# Patient Record
Sex: Female | Born: 1940 | Race: White | Hispanic: No | Marital: Married | Smoking: Never smoker
Health system: Southern US, Community
[De-identification: ages and names within clinical notes are randomized; demographics above are authoritative.]

## PROBLEM LIST (undated history)

## (undated) DIAGNOSIS — M51379 Other intervertebral disc degeneration, lumbosacral region without mention of lumbar back pain or lower extremity pain: Secondary | ICD-10-CM

## (undated) DIAGNOSIS — E039 Hypothyroidism, unspecified: Secondary | ICD-10-CM

## (undated) DIAGNOSIS — I8390 Asymptomatic varicose veins of unspecified lower extremity: Secondary | ICD-10-CM

## (undated) DIAGNOSIS — I10 Essential (primary) hypertension: Secondary | ICD-10-CM

## (undated) DIAGNOSIS — F419 Anxiety disorder, unspecified: Secondary | ICD-10-CM

## (undated) DIAGNOSIS — I129 Hypertensive chronic kidney disease with stage 1 through stage 4 chronic kidney disease, or unspecified chronic kidney disease: Secondary | ICD-10-CM

## (undated) DIAGNOSIS — E7800 Pure hypercholesterolemia, unspecified: Secondary | ICD-10-CM

## (undated) DIAGNOSIS — N182 Chronic kidney disease, stage 2 (mild): Secondary | ICD-10-CM

## (undated) DIAGNOSIS — M47816 Spondylosis without myelopathy or radiculopathy, lumbar region: Secondary | ICD-10-CM

## (undated) DIAGNOSIS — M8580 Other specified disorders of bone density and structure, unspecified site: Secondary | ICD-10-CM

## (undated) DIAGNOSIS — H9190 Unspecified hearing loss, unspecified ear: Secondary | ICD-10-CM

## (undated) DIAGNOSIS — I700 Atherosclerosis of aorta: Secondary | ICD-10-CM

## (undated) DIAGNOSIS — Z780 Asymptomatic menopausal state: Secondary | ICD-10-CM

## (undated) DIAGNOSIS — K219 Gastro-esophageal reflux disease without esophagitis: Secondary | ICD-10-CM

## (undated) DIAGNOSIS — F329 Major depressive disorder, single episode, unspecified: Secondary | ICD-10-CM

## (undated) DIAGNOSIS — M810 Age-related osteoporosis without current pathological fracture: Secondary | ICD-10-CM

## (undated) DIAGNOSIS — E871 Hypo-osmolality and hyponatremia: Secondary | ICD-10-CM

## (undated) DIAGNOSIS — K589 Irritable bowel syndrome without diarrhea: Secondary | ICD-10-CM

## (undated) DIAGNOSIS — E038 Other specified hypothyroidism: Secondary | ICD-10-CM

## (undated) DIAGNOSIS — M5137 Other intervertebral disc degeneration, lumbosacral region: Secondary | ICD-10-CM

## (undated) DIAGNOSIS — M87051 Idiopathic aseptic necrosis of right femur: Secondary | ICD-10-CM

## (undated) DIAGNOSIS — E559 Vitamin D deficiency, unspecified: Secondary | ICD-10-CM

## (undated) DIAGNOSIS — R2689 Other abnormalities of gait and mobility: Secondary | ICD-10-CM

## (undated) DIAGNOSIS — E876 Hypokalemia: Secondary | ICD-10-CM

## (undated) DIAGNOSIS — IMO0001 Reserved for inherently not codable concepts without codable children: Secondary | ICD-10-CM

## (undated) DIAGNOSIS — F32A Depression, unspecified: Secondary | ICD-10-CM

## (undated) DIAGNOSIS — F339 Major depressive disorder, recurrent, unspecified: Secondary | ICD-10-CM

## (undated) HISTORY — DX: Other specified disorders of bone density and structure, unspecified site: M85.80

## (undated) HISTORY — DX: Unspecified hearing loss, unspecified ear: H91.90

## (undated) HISTORY — DX: Major depressive disorder, recurrent, unspecified: F33.9

## (undated) HISTORY — DX: Other intervertebral disc degeneration, lumbosacral region without mention of lumbar back pain or lower extremity pain: M51.379

## (undated) HISTORY — DX: Hypo-osmolality and hyponatremia: E87.1

## (undated) HISTORY — PX: CHOLECYSTECTOMY: SHX55

## (undated) HISTORY — DX: Irritable bowel syndrome, unspecified: K58.9

## (undated) HISTORY — DX: Other specified hypothyroidism: E03.8

## (undated) HISTORY — DX: Asymptomatic menopausal state: Z78.0

## (undated) HISTORY — DX: Other intervertebral disc degeneration, lumbosacral region: M51.37

## (undated) HISTORY — DX: Idiopathic aseptic necrosis of right femur: M87.051

## (undated) HISTORY — DX: Gastro-esophageal reflux disease without esophagitis: K21.9

## (undated) HISTORY — DX: Atherosclerosis of aorta: I70.0

## (undated) HISTORY — DX: Vitamin D deficiency, unspecified: E55.9

## (undated) HISTORY — PX: BREAST EXCISIONAL BIOPSY: SUR124

## (undated) HISTORY — PX: CATARACT EXTRACTION: SUR2

## (undated) HISTORY — PX: BREAST SURGERY: SHX581

## (undated) HISTORY — DX: Essential (primary) hypertension: I10

## (undated) HISTORY — DX: Hypertensive chronic kidney disease with stage 1 through stage 4 chronic kidney disease, or unspecified chronic kidney disease: I12.9

## (undated) HISTORY — DX: Age-related osteoporosis without current pathological fracture: M81.0

## (undated) HISTORY — DX: Hypokalemia: E87.6

## (undated) HISTORY — DX: Depression, unspecified: F32.A

## (undated) HISTORY — DX: Pure hypercholesterolemia, unspecified: E78.00

## (undated) HISTORY — DX: Chronic kidney disease, stage 2 (mild): N18.2

## (undated) HISTORY — PX: FOOT SURGERY: SHX648

## (undated) HISTORY — DX: Spondylosis without myelopathy or radiculopathy, lumbar region: M47.816

## (undated) HISTORY — DX: Anxiety disorder, unspecified: F41.9

## (undated) HISTORY — DX: Other abnormalities of gait and mobility: R26.89

## (undated) HISTORY — PX: VAGINAL HYSTERECTOMY: SUR661

## (undated) HISTORY — DX: Asymptomatic varicose veins of unspecified lower extremity: I83.90

## (undated) HISTORY — DX: Reserved for inherently not codable concepts without codable children: IMO0001

## (undated) HISTORY — DX: Hypothyroidism, unspecified: E03.9

---
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
## 2004-02-08 ENCOUNTER — Other Ambulatory Visit: Admission: RE | Admit: 2004-02-08 | Discharge: 2004-02-08 | Payer: Self-pay | Admitting: Family Medicine

## 2004-09-09 ENCOUNTER — Other Ambulatory Visit: Admission: RE | Admit: 2004-09-09 | Discharge: 2004-09-09 | Payer: Self-pay | Admitting: Gynecology

## 2005-08-22 ENCOUNTER — Other Ambulatory Visit: Admission: RE | Admit: 2005-08-22 | Discharge: 2005-08-22 | Payer: Self-pay | Admitting: Family Medicine

## 2008-01-25 ENCOUNTER — Other Ambulatory Visit: Admission: RE | Admit: 2008-01-25 | Discharge: 2008-01-25 | Payer: Self-pay | Admitting: Family Medicine

## 2008-06-26 ENCOUNTER — Inpatient Hospital Stay (HOSPITAL_COMMUNITY): Admission: AD | Admit: 2008-06-26 | Discharge: 2008-06-27 | Payer: Self-pay | Admitting: Internal Medicine

## 2010-04-29 ENCOUNTER — Other Ambulatory Visit: Admission: RE | Admit: 2010-04-29 | Discharge: 2010-04-29 | Payer: Self-pay | Admitting: Family Medicine

## 2011-04-29 NOTE — Discharge Summary (Signed)
NAMECANDISE, CRABTREE         ACCOUNT NO.:  [id number]   MEDICAL RECORD NO.:  [id number]          PATIENT TYPE:  INP   LOCATION:  3737                         FACILITY:  [facility]   PHYSICIAN:  Ramiro Harvest, MD    DATE OF BIRTH:  January 06, 1941   DATE OF ADMISSION:  06/26/2008  DATE OF DISCHARGE:  06/27/2008                               DISCHARGE SUMMARY   PRIMARY CARE PHYSICIAN:  Gretta Arab. Valentina Lucks, MD of [facility].   GASTROENTEROLOGIST:  Llana Aliment. Randa Evens, MD of [facility].   DISCHARGE DIAGNOSES:  1. Acute gastroenteritis.  2. Syncope secondary to volume depletion.  3. Hypokalemia.  4. Hypertension.  5. Hyponatremia.  6. Anxiety.  7. Leukocytosis secondary to acute gastroenteritis.  8. Irritable bowel syndrome.  9. Constipation.  10.Gastroesophageal reflux disease.  11.Hyperlipidemia.  12.Status post cholecystectomy.  13.Status post hysterectomy.  14.Benign bilateral breast masses.   DISCHARGE MEDICATIONS:  1. Crestor 5 mg p.o. daily.  2. Lexapro 10 mg p.o. daily.  3. Aspirin 81 mg p.o. daily.  4. Nexium 40 mg p.o. daily.  5. Multivitamin daily.  6. Lisinopril/hydrochlorothiazide 10/12.5 mg daily.  The patient is to      start this in 3-4 days after her diarrhea has subsided.  7. Fish oil pill 1000 mg 3 times a week.  8. Flaxseed daily.  9. Valium 2.5 mg p.o. p.r.n.   DISPOSITION AND FOLLOWUP:  The patient will be discharged home.  The  patient is to follow up with PCP in 1 week.  On followup, the patient is  to get a basic metabolic profile to check her potassium and electrolytes  and also reassess the patient's gastroenteritis.  The patient's blood  pressure needs to be reassessed as well.  The patient's blood pressure  medications adjusted accordingly.  The patient's stool studies need to  be followed up per PCP.   CONSULTATIONS DONE:  None.   PROCEDURES DONE.:  A chest x-ray was done on June 26, 2008, that showed  no acute cardiopulmonary disease,  cholecystectomy, atherosclerosis, and  bilateral pleural apical scarring, question smoker.  A CT of the abdomen  and pelvis were done on June 27, 2008, which showed a thickened small  bowel loops in the lower central abdomen may be due to enteritis,  ascites and small bilateral pleural effusions as well as ascites in the  pelvis.   BRIEF ADMISSION HISTORY AND PHYSICAL:  Ms. Tiffinie Caillier is a 70-  year-old white female with history of hypertension, hyperlipidemia, IBS,  and anxiety who presented to the [facility] with a  several episodes of diarrhea, nausea, emesis, and diffuse abdominal pain  as well as 3 episodes of syncope.  The patient stated that she ate a  cheese steak pizza the night before admission around 5 p.m. and then at  home started to have this diffuse abdominal pain began at 8 p.m.,  everyone else who ate the same meal was not sick as well.  The patient  took 2 Tums 2 times and half a tablet of Valium with no relief.  The  patient woke back up at around 1 a.m.  with some abdominal pain, which  was 10/10.  She describes the pain as hurting.  The patient had some  nonbloody diarrhea x2 episodes at home with some emesis x2 with some  mild relief of abdominal pain.  The patient then went to bed and was  awoken back up around 2:45 a.m. with nausea, sat up in the chair for an  hour and stated that she blacked out twice hitting the head on the foot  of the bed.  Per husband, the patient passed out approximately 3 times  each one lasting approximately less than a minute.  The patient also had  some clamminess and diaphoresis with some subjective fevers.  The  patient denied any hematemesis.  No melena.  No hematochezia.  No chest  pain.  No shortness of breath.  No facial droop.  No slurred speech.  No  focal neurological symptoms.  No other associated symptoms.  The patient  then presented to the ED at the outside hospital were head CT done was  read as  negative.  The patient was found to have a sodium of 129, and  potassium of 3.4.  White count elevated at 17.7.  The patient stated  that she was recently started lisinopril/hydrochlorothiazide and only  took 1 dose prior to admission.  The patient was then transferred to  [facility] for further evaluation and management.  The patient  denied any use of any well water at home.  No ingestion of any exotic  foods.  No recent travel as well.  The patient stated that her abdominal  pain was currently improvement and had no more episodes of emesis,  nausea, or diarrhea.   PHYSICAL EXAMINATION:  VITAL SIGNS:  On admission, temperature 98.7,  pulse of 79, blood pressure 129/58, respiratory rate 20, and satting 98%  on room air.  GENERAL:  The patient in no apparent distress.  HEENT:  Normocephalic and atraumatic.  Pupils are equal, round, and  reactive to light.  Extraocular movements intact.  Oropharynx is clear.  No lesions.  No exudate.  NECK:  Supple.  No lymphadenopathy.  RESPIRATORY:  Lungs are clear to auscultation bilaterally.  No wheezes.  No rhonchi.  CARDIOVASCULAR:  Regular rate and rhythm.  No murmurs, rubs, or gallops.  ABDOMEN:  Soft and nondistended.  Diffuse tenderness to palpation.  Positive bowel sounds.  EXTREMITIES:  No clubbing, cyanosis, or edema.  NEUROLOGIC:  The patient was alert and oriented x3.  Cranial nerves II  through XII are grossly intact.  No focal deficits.  Gait was not  tested.   ADMISSION LABS ON OUTSIDE HOSPITAL:  CBC, white count 17.7, hemoglobin  15.7, platelets 278, hematocrit 43.8, ANC of 16.9, and amylase 82.  Sodium 129, potassium 3.4, chloride 92, bicarb 28, BUN 15, creatinine  0.86, and glucose 172.  Alk phosphatase 58, total bilirubin 1.4, AST of  23, ALT of 18, protein 6.4, albumin 3.9, calcium of 9.0, CPK 129, CK-MB  4.8, myoglobin 108, and troponin I less than 0.03.  CT of the head was  negative for any acute intracranial  pathology.   HOSPITAL COURSE:  Acute gastroenteritis.  The patient was admitted for  acute gastroenteritis.  Stool studies were obtained.  Blood cultures x2  were also obtained.  A CBC and a BMET were also obtained, which came  back within normal limits.  Cardiac enzymes were cycled, which came back  negative.  A TSH was also obtained, which was within normal  limits.  Amylase and lipase were also obtained which were within normal limits.  The patient was placed on IV fluids and monitored throughout the  hospitalization.  The patient remained stable.  The patient's symptoms  improved.  The patient did not have any more episodes of emesis.  The  patient's diarrhea improved.  There was a decrease number of stools,  decreased volume, and stools was a little more formed.  The patient was  initially placed on clear liquid diet, which was then advanced to full  regular diet, which the patient tolerated well.  CT of the abdomen was  also obtained with results as stated above, which were consistent with  acute gastroenteritis.  Ascites seen on CT, will need to be followed up  as an outpatient.  The patient remained stable and improved condition.  The patient was very adamant on going home and the patient was thus  discharged on June 27, 2008 in stable and improved condition.  The  patient was advised to stay well hydrated and to follow up with PCP in a  week.  On followup, the patient's stool studies will need to be followed  up as well as the patient may need followup on the ascites per abdominal  CT in probably the next few weeks or in a month.  The patient remained  stable and discharged in stable and improved condition. On the day of  discharge vital signs, temperature 98.5, pulse of 75, blood pressure  126/69, respiratory rate 16, and satting 95% on room air.  Sodium 135,  potassium 3.3, chloride 104, bicarb 28, BUN 5, creatinine 0.73, glucose  of 123, calcium of 8.1, and bilirubin 1.2.  Alk  phosphatase 39, AST 18,  ALT 12, albumin 3.0, and protein of 4.9.  CBC, white count 6.9,  hemoglobin 11.9, platelets of 216, hematocrit 34.4, ANC of 4.3, lipase  30, and amylase 63.  Cardiac enzymes were negative.  The patient will be  discharged in stable and improved condition.   It was a pleasure taking care of Ms. Leonie Green.      Ramiro Harvest, MD  Electronically Signed     DT/MEDQ  D:  06/27/2008  T:  06/28/2008  Job:  [id number]   cc:   Gretta Arab. Valentina Lucks, M.D.  James L. Malon Kindle., M.D.

## 2011-04-29 NOTE — H&P (Signed)
NAMESALAH, BURLISON         ACCOUNT NO.:  [id number]   MEDICAL RECORD NO.:  [id number]          PATIENT TYPE:  INP   LOCATION:  3737                         FACILITY:  [facility]   PHYSICIAN:  Ramiro Harvest, MD    DATE OF BIRTH:  [birth year]   DATE OF ADMISSION:  06/26/2008  DATE OF DISCHARGE:                              HISTORY & PHYSICAL   PRIMARY CARE PHYSICIAN:  Gretta Arab. Valentina Lucks, MD   GASTROENTEROLOGIST:  Llana Aliment. Edwards, MD   HISTORY OF PRESENT ILLNESS:  Teneisha Gignac is a 70 year old white  female with history of hypertension, hyperlipidemia, IBS, anxiety who  presented to [facility] with a several hour history  of several episodes of diarrhea, nausea, emesis, and diffuse abdominal  pain as well as 3 episodes of syncope.  The patient stated that she ate  cheese steak pizza last night around 5 p.m. and then at home started to  have this diffuse abdominal pain beginning at 8 p.m.  Everyone else who  ate the same meal was not sick as well.  The patient took 2 Tums and  Halfprin with no relief.  The patient woke back up at 1 a.m. with some  abdominal pain 10/10.  She describes the pain as a hurting.  The  patient has some nonbloody diarrhea x2 episodes at home with some emesis  x2 with some mild relief of her abdominal pain.  The patient then went  to bed and was awoken back up at 2:45 a.m. with nausea and sat up in the  chair for an hour and stated that she blacked out twice hitting her head  on the foot of her bed.  Per husband, the patient passed approximately 3  times each one lasting approximately about a minute.  The patient also  with some clamminess and diaphoresis, some subjective fevers.  The  patient denies any hematemesis, no melena, no hematochezia, no chest  pain, no shortness of breath, no facial droop, no slurred speech, no  focal neurological symptoms, and no other associated symptoms.  The  patient then presented to the ED at the  outside hospital where head CT  was done which was read as negative.  The patient was found to have a  sodium of 129, potassium of 3.4, and white count elevated at 17.7.  The  patient states recently was started on lisinopril/hydrochlorothiazide,  took one dose 1 day prior to admission.  The patient was then  transferred to the [facility] for further evaluation and  management.  The patient denies any use of any well water at home and no  ingestion of any exotic food and no recent travel as well.  The patient  states that her abdominal pain is currently improved and has had no more  episodes of emesis, nausea, or diarrhea.   ALLERGIES:  PENICILLIN causes a rash.  SULFA causes a rash and CODEINE.   PAST MEDICAL HISTORY:  1. Hypertension.  2. Hyperlipidemia.  3. Status post cholecystectomy.  4. Anxiety.  5. IBS.  6. Constipation.  7. Status post hysterectomy.  8. Benign bilateral breast masses.  9.  Gastroesophageal reflux disease.   HOME MEDICATIONS:  1. Crestor 5 mg daily.  2. Lexapro 10 mg daily.  3. Aspirin 81 mg daily.  4. Nexium 40 mg daily.  5. Multivitamin daily.  6. Lisinopril/hydrochlorothiazide, dose unknown.  7. Fish oil pill 1000 mg 3 times a week.  8. Flaxseed daily.  9. MiraLax 17 grams daily.   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  No tobacco use, no alcohol use, and no IV drug  use.   FAMILY HISTORY:  Mother deceased at age 25 from a CVA and bad urinary  tract infection.  Father deceased at age 60 from a CVA.  The patient has  2 sisters whose health is unknown, and has 1 brother who had an acute MI  status post CABG in 2007.   REVIEW OF SYSTEMS:  As stated per HPI.  Also positive for fatigue,  nausea, vomiting, diarrhea, and weakness.   PHYSICAL EXAMINATION:  VITAL SIGNS:  Temperature 98.7, pulse of 79,  blood pressure  129/58, respiratory rate 20, saturating 98% on room air.  GENERAL:  The patient is in no apparent distress.  HEENT:  Normocephalic, atraumatic.  Pupils equal, round, and reactive to  light.  Extraocular movements are intact.  Oropharynx is clear.  No  lesions, no exudate.  NECK:  Supple.  No lymphadenopathy.  RESPIRATORY:  Lungs are clear to auscultation bilaterally.  No wheezes,  no rhonchi.  CARDIOVASCULAR:  Regular rate and rhythm.  No murmurs, rubs, or gallops.  ABDOMEN:  Soft, nondistended, diffuse tenderness to palpation.  Positive  bowel sounds.  EXTREMITIES:  No clubbing, cyanosis, or edema.  NEUROLOGICAL:  The patient is alert and oriented x3.  Cranial nerves II-  XII are grossly intact.  No focal deficits.  Gait not tested.   LABORATORY DATA:  From the outside hospital:  CBC, white count 17.7,  hemoglobin 15.7, platelets 278, hematocrit 43.8, ANC of 16.9, amylase  82, sodium 129, potassium 3.4, chloride 92, bicarb 28, BUN 15,  creatinine 0.86, glucose 172, alk phosphatase 58, total bilirubin 1.4,  AST 23, ALT 18, protein 6.4, albumin 3.9, calcium of 9.0, CPK 129, CK-MB  4.8, myoglobin 108, troponin I less than 0.03.  CT of the head was  negative for any acute intracranial pathology.   ASSESSMENT AND PLAN:  Ms. Kessler Kopinski is a 70 year old female  who presents to [facility] with a several hour history  of nausea, vomiting, and abdominal pain with several episodes of  diarrhea and emesis.  1. Nausea, vomiting, diarrhea, abdominal pain, likely secondary to a      viral versus bacterial gastroenteritis versus acute abdominal      process.  We will check a urinalysis with cultures and      sensitivities, check a magnesium level, check blood cultures x2,      check the lipase, check CT of the abdomen and pelvis.  We will      check stool studies.  We will cycle cardiac enzymes.  We will place      on IV fluids and follow.  2. Syncope, likely secondary to  hypovolemia secondary to nausea,      vomiting, diarrhea, abdominal pain versus neurological which is      unlikely with a normal head CT and no neurological deficits versus      cardiac.  We will check orthostasis.  We will cycle cardiac enzymes      q.8 h. x3.  Check an EKG.  We will place her on IV fluids, hold      blood pressure medications for now, and follow.  3. Hypertension.  Hold blood pressure medications.  4. Hyponatremia, likely secondary to hypovolemia in the setting of      diuretic use.  We will hold diuretics for now.  We will check      orthostasis.  Check a fractional excretion of sodium, check a TSH,      check a random cortisol level, check a PA and lateral chest x-ray,      place on IV fluids, and follow sodium levels.  5. Hypokalemia.  Check a magnesium level and replete.  6. Anxiety, Lexapro.  7. Gastroesophageal reflux disease, Protonix.  8. Hyperlipidemia, Crestor.  9. Inflammatory bowel syndrome.  10.Constipation.  11.Leukocytosis, likely secondary to nausea, vomiting, diarrhea, and      abdominal pain.  We will check a UA.  We will check a PA and      lateral chest x-ray.  No IV antibiotics at this current time.      Prophylaxis Protonix for gastrointestinal prophylaxis.  Sequential      compression devices for deep vein thrombosis prophylaxis.   It has been a pleasure taking care of Ms. Leonie Green.      Ramiro Harvest, MD  Electronically Signed     DT/MEDQ  D:  06/26/2008  T:  06/27/2008  Job:  [id number]   cc:   Gretta Arab. Valentina Lucks, M.D.  James L. Malon Kindle., M.D.

## 2011-09-11 LAB — CLOSTRIDIUM DIFFICILE EIA: C difficile Toxins A+B, EIA: NEGATIVE

## 2011-09-11 LAB — DIFFERENTIAL
Basophils Absolute: 0
Basophils Relative: 1
Eosinophils Absolute: 0.1
Lymphs Abs: 1.6
Neutrophils Relative %: 63

## 2011-09-11 LAB — TSH: TSH: 1.781

## 2011-09-11 LAB — URINALYSIS, ROUTINE W REFLEX MICROSCOPIC
Ketones, ur: NEGATIVE
Nitrite: NEGATIVE
Protein, ur: NEGATIVE
Urobilinogen, UA: 0.2

## 2011-09-11 LAB — BASIC METABOLIC PANEL
BUN: 8
Chloride: 101
Glucose, Bld: 100 — ABNORMAL HIGH
Potassium: 3.6

## 2011-09-11 LAB — COMPREHENSIVE METABOLIC PANEL
ALT: 12
CO2: 28
Calcium: 8.1 — ABNORMAL LOW
GFR calc non Af Amer: >60
Glucose, Bld: 123 — ABNORMAL HIGH
Sodium: 135

## 2011-09-11 LAB — GIARDIA/CRYPTOSPORIDIUM SCREEN(EIA)
Cryptosporidium Screen (EIA): NEGATIVE
Giardia Screen - EIA: NEGATIVE

## 2011-09-11 LAB — CK TOTAL AND CKMB (NOT AT ARMC)
Relative Index: 2.7 — ABNORMAL HIGH
Total CK: 112

## 2011-09-11 LAB — URINE CULTURE: Colony Count: 10000

## 2011-09-11 LAB — TROPONIN I
Troponin I: 0.01
Troponin I: <0.01

## 2011-09-11 LAB — CBC
Hemoglobin: 11.9 — ABNORMAL LOW
MCHC: 34.5
MCV: 98.1
RBC: 3.5 — ABNORMAL LOW

## 2011-09-11 LAB — LIPASE, BLOOD: Lipase: 30

## 2011-09-11 LAB — PROTIME-INR: Prothrombin Time: 13.9

## 2011-09-11 LAB — CORTISOL: Cortisol, Plasma: 5.2

## 2012-04-05 ENCOUNTER — Encounter: Payer: Self-pay | Admitting: Gynecology

## 2012-04-05 ENCOUNTER — Ambulatory Visit (INDEPENDENT_AMBULATORY_CARE_PROVIDER_SITE_OTHER): Payer: Medicare Other | Admitting: Gynecology

## 2012-04-05 VITALS — BP 124/76 | Ht 63.0 in | Wt 163.0 lb

## 2012-04-05 DIAGNOSIS — N3941 Urge incontinence: Secondary | ICD-10-CM

## 2012-04-05 DIAGNOSIS — IMO0002 Reserved for concepts with insufficient information to code with codable children: Secondary | ICD-10-CM

## 2012-04-05 DIAGNOSIS — I10 Essential (primary) hypertension: Secondary | ICD-10-CM | POA: Insufficient documentation

## 2012-04-05 DIAGNOSIS — E7800 Pure hypercholesterolemia, unspecified: Secondary | ICD-10-CM | POA: Insufficient documentation

## 2012-04-05 DIAGNOSIS — F419 Anxiety disorder, unspecified: Secondary | ICD-10-CM | POA: Insufficient documentation

## 2012-04-05 DIAGNOSIS — N952 Postmenopausal atrophic vaginitis: Secondary | ICD-10-CM

## 2012-04-05 DIAGNOSIS — N8111 Cystocele, midline: Secondary | ICD-10-CM

## 2012-04-05 DIAGNOSIS — K589 Irritable bowel syndrome without diarrhea: Secondary | ICD-10-CM | POA: Insufficient documentation

## 2012-04-05 MED ORDER — ESTRADIOL 10 MCG VA TABS: 1.0000 | ORAL_TABLET | VAGINAL | Status: DC

## 2012-04-05 NOTE — Progress Notes (Signed)
Brittany Douglas 07/12/1941 [id number]        71 y.o.  for follow up. Has not been the office for a number of years. Is having issues with dyspareunia/vaginal tearing with intercourse.  Had a trial of Vagifem a number of years ago but stopped it. She does use some estrogen cream samples from elsewhere that she applies just around the opening intermittently which seems to help.  Past medical history,surgical history, medications, allergies, family history and social history were all reviewed and documented in the EPIC chart. ROS:  Was performed and pertinent positives and negatives are included in the history.  Exam: Sherrilyn Rist chaperone present Filed Vitals:   04/05/12 1009  BP: 124/76   General appearance  Normal Skin grossly normal Head/Neck normal with no cervical or supraclavicular adenopathy thyroid normal Lungs  clear Cardiac RR, without RMG Abdominal  soft, nontender, without masses, organomegaly or hernia Breasts  examined lying and sitting without masses, retractions, discharge or axillary adenopathy. Pelvic  Ext/BUS/vagina  normal with atrophic changes, mild cystocele noted  Adnexa  Without masses or tenderness    Anus and perineum  normal   Rectovaginal  normal sphincter tone without palpated masses or tenderness.    Assessment/Plan:  71 y.o. female for annual exam.    1. Dyspareunia with vaginal perineal with intercourse. Reviewed options to include OTC lubricant/moisturizers. Systemic ERT/topical ERT. Vaginal creams versus Vagifem and the absorption issue reviewed. WHI study with increased risk of stroke heart attack DVT possible breast cancer issues if significant absorption all discussed. Patient wants to try the Vagifem again. She accepts the risks and I gave her 18 samples to start with one applicator nightly x12 and then 2 times weekly and she will call me at the end of the fifth weeks to see how she's doing. I did refill her times a year for this. If she is not doing any  better the possibility of using a small amount of estrogen cream at the introital opening which also seem to help was discussed. The absorption issue here was also reviewed. 2. Cystocele/urge incontinence. Patient not overly symptomatic and we'll plan on observation at this time. We'll see how she responds to Vagifem and then go from there. 3. Mammography. Patient is due for her mammogram next month and knows to schedule this. She'll continue with annual mammography. SBE monthly reviewed. 4. DEXA. Patient had her last DEXA 2 years ago and follows up through [facility] and will continue to do so. 5. Pap smear. She has no history of significant abnormal Pap smears in the past. She is over 73 years of age and status post hysterectomy for benign indications. I discussed current screening guidelines we will stop doing Pap smears which she is comfortable with. 6. Colonoscopy. Patient is up-to-date and we'll continue to follow through [facility] for colon health. 7. Health maintenance. No blood work was done today this is all done through her physicians at [place]. She will call me in 5 weeks and follow up to the Vagifem trial then we'll go from there.    Dara Lords MD, 11:11 AM 04/05/2012

## 2012-04-05 NOTE — Patient Instructions (Signed)
Use Vagifem tablet nightly x12 stent 2 times weekly. Call me in 5 weeks as to how you're doing.

## 2012-04-06 LAB — URINALYSIS W MICROSCOPIC + REFLEX CULTURE
Hgb urine dipstick: NEGATIVE
Leukocytes, UA: NEGATIVE
Nitrite: NEGATIVE
Protein, ur: NEGATIVE mg/dL
Squamous Epithelial / LPF: NONE SEEN
pH: 7 (ref 5.0–8.0)

## 2012-04-22 ENCOUNTER — Encounter: Payer: Self-pay | Admitting: Gynecology

## 2012-05-03 ENCOUNTER — Encounter: Payer: Self-pay | Admitting: *Deleted

## 2012-05-03 NOTE — Progress Notes (Signed)
Patient ID: Brittany Douglas, female   DOB: Oct 05, 1941, 71 y.o.   MRN: [id number] Pt walked in..wanted to let us know that the Vagifem is working and asked for more samples. She said she is in the doughnut hole with her insurance. Therefore more samples were given to the patient.

## 2013-09-09 ENCOUNTER — Ambulatory Visit (INDEPENDENT_AMBULATORY_CARE_PROVIDER_SITE_OTHER): Payer: Medicare Other | Admitting: Gynecology

## 2013-09-09 ENCOUNTER — Encounter: Payer: Self-pay | Admitting: Gynecology

## 2013-09-09 VITALS — BP 112/66

## 2013-09-09 DIAGNOSIS — N898 Other specified noninflammatory disorders of vagina: Secondary | ICD-10-CM

## 2013-09-09 DIAGNOSIS — IMO0002 Reserved for concepts with insufficient information to code with codable children: Secondary | ICD-10-CM

## 2013-09-09 DIAGNOSIS — N952 Postmenopausal atrophic vaginitis: Secondary | ICD-10-CM

## 2013-09-09 LAB — WET PREP FOR TRICH, YEAST, CLUE
Clue Cells Wet Prep HPF POC: NONE SEEN
Trich, Wet Prep: NONE SEEN

## 2013-09-09 MED ORDER — ESTRADIOL 0.1 MG/GM VA CREA: 2.0000 g | TOPICAL_CREAM | VAGINAL | Status: DC

## 2013-09-09 MED ORDER — FLUCONAZOLE 150 MG PO TABS: 150.0000 mg | ORAL_TABLET | Freq: Once | ORAL | Status: DC

## 2013-09-09 NOTE — Progress Notes (Signed)
Patient presents complaining of a vaginal discharge with irritation of the last several weeks. Having discomfort with intercourse to the point of skin cracking and bleeding afterwards. Is using Vagifem but using it once weekly.  Exam was Berenice Bouton External BUS vagina with atrophic changes. White discharge noted. Bimanual without masses or tenderness.  Assessment and plan: 1. Vaginal discharge with irritation. Wet prep is positive for yeast. Treat with Diflucan 150 mg one day repeat every other day x1 2. Atrophic vaginitis. Not using enough Vagifem. Recommended increasing to twice weekly. Patient stated she liked using her Premarin cream better that she used in the past. Discussed possible absorption and risks to include stroke heart attack DVT and breast cancer. Patient understands and accepts I wrote for Estrace vaginal cream twice weekly refill tube x 4. Followup if symptoms persist.  Patient does have appointment with her primary physician for a full complete exam.

## 2013-09-09 NOTE — Patient Instructions (Signed)
Take Diflucan pill one day then wait a day and take the second pill 2 days later Start on the estrogen vaginal cream twice weekly.

## 2013-10-25 ENCOUNTER — Other Ambulatory Visit: Payer: Self-pay

## 2013-10-25 DIAGNOSIS — Z1231 Encounter for screening mammogram for malignant neoplasm of breast: Secondary | ICD-10-CM

## 2013-11-25 ENCOUNTER — Ambulatory Visit
Admission: RE | Admit: 2013-11-25 | Discharge: 2013-11-25 | Disposition: A | Discharge status: Home / Self Care | Payer: Medicare Other | Source: Ambulatory Visit

## 2013-11-25 DIAGNOSIS — Z1231 Encounter for screening mammogram for malignant neoplasm of breast: Secondary | ICD-10-CM

## 2014-04-17 ENCOUNTER — Other Ambulatory Visit: Payer: Self-pay | Admitting: Physician Assistant

## 2014-04-17 DIAGNOSIS — E2839 Other primary ovarian failure: Secondary | ICD-10-CM

## 2014-04-24 ENCOUNTER — Ambulatory Visit
Admission: RE | Admit: 2014-04-24 | Discharge: 2014-04-24 | Disposition: A | Discharge status: Home / Self Care | Payer: Medicare HMO | Source: Ambulatory Visit | Attending: Physician Assistant | Admitting: Physician Assistant

## 2014-04-24 DIAGNOSIS — E2839 Other primary ovarian failure: Secondary | ICD-10-CM

## 2014-10-16 ENCOUNTER — Encounter: Payer: Self-pay | Admitting: Gynecology

## 2014-10-19 ENCOUNTER — Other Ambulatory Visit: Payer: Self-pay

## 2014-10-19 DIAGNOSIS — Z1231 Encounter for screening mammogram for malignant neoplasm of breast: Secondary | ICD-10-CM

## 2014-11-15 ENCOUNTER — Other Ambulatory Visit: Payer: Self-pay | Admitting: Gastroenterology

## 2014-11-17 ENCOUNTER — Other Ambulatory Visit: Payer: Self-pay | Admitting: Gynecology

## 2014-11-28 ENCOUNTER — Ambulatory Visit
Admission: RE | Admit: 2014-11-28 | Discharge: 2014-11-28 | Disposition: A | Discharge status: Home / Self Care | Payer: Medicare HMO | Source: Ambulatory Visit

## 2014-11-28 DIAGNOSIS — Z1231 Encounter for screening mammogram for malignant neoplasm of breast: Secondary | ICD-10-CM

## 2014-11-30 ENCOUNTER — Other Ambulatory Visit: Payer: Self-pay | Admitting: Family Medicine

## 2014-11-30 DIAGNOSIS — R928 Other abnormal and inconclusive findings on diagnostic imaging of breast: Secondary | ICD-10-CM

## 2014-12-05 ENCOUNTER — Other Ambulatory Visit: Payer: Medicare HMO

## 2014-12-14 ENCOUNTER — Ambulatory Visit
Admission: RE | Admit: 2014-12-14 | Discharge: 2014-12-14 | Disposition: A | Discharge status: Home / Self Care | Payer: Medicare HMO | Source: Ambulatory Visit | Attending: Family Medicine | Admitting: Family Medicine

## 2014-12-14 DIAGNOSIS — R928 Other abnormal and inconclusive findings on diagnostic imaging of breast: Secondary | ICD-10-CM

## 2015-02-09 ENCOUNTER — Other Ambulatory Visit: Payer: Self-pay

## 2015-02-09 MED ORDER — ESTRADIOL 0.1 MG/GM VA CREA: 2.0000 g | TOPICAL_CREAM | VAGINAL | Status: DC

## 2015-02-09 NOTE — Telephone Encounter (Signed)
Patient said we denied her refill on Estrace because she is overdue for annual. Last exam was 08/2013 and I believe that was a problem visit. She has schedule a CE for 04/04/15 with you and said she really needs her Estrace cream and just cannot wait until her April visit.

## 2015-02-09 NOTE — Telephone Encounter (Signed)
Ok refill for one tube

## 2015-02-15 ENCOUNTER — Telehealth: Payer: Self-pay | Admitting: *Deleted

## 2015-02-15 NOTE — Telephone Encounter (Signed)
Pt called stating estradiol vaginal cream too expensive, requesting cheaper Rx. I explained to patient best to contact insurance company to get a list of medication with lower tier that similar to estradiol and let me know to relay to TF. Pt agreed and will do.

## 2015-02-16 ENCOUNTER — Telehealth: Payer: Self-pay

## 2015-02-16 NOTE — Telephone Encounter (Signed)
Fax received from pharmacy regarding Estrace Vaginal Cream.  "Please check with MD about substitution to Premarin Vaginal Cream per patient request due to high co-pay."

## 2015-02-18 NOTE — Telephone Encounter (Signed)
Patient overdue for exam.  No refill until follow up exam.

## 2015-02-19 NOTE — Telephone Encounter (Signed)
Dr. Phineas Real, She contacted [place] on 02/09/15 and said she was overdue but had scheduled appt 04/04/15 with you and  really needed her Estrace cream. Said she could not wait until April visit. You had refilled it for one tube at that time. That is what prompted this need for a change in Rx. I am sure her plan changed with the new year making it less affordable.

## 2015-02-20 ENCOUNTER — Other Ambulatory Visit: Payer: Self-pay | Admitting: Gynecology

## 2015-02-20 MED ORDER — ESTROGENS, CONJUGATED 0.625 MG/GM VA CREA: TOPICAL_CREAM | VAGINAL | Status: DC

## 2015-02-20 NOTE — Telephone Encounter (Signed)
Rx sent to pharmacy   

## 2015-02-20 NOTE — Telephone Encounter (Signed)
So the original question on this encounter was ""Please check with MD about substitution to Premarin Vaginal Cream per patient request due to high co-pay."  Ok?

## 2015-02-20 NOTE — Telephone Encounter (Signed)
That is okay.

## 2015-02-20 NOTE — Telephone Encounter (Signed)
Okay to refill 1 

## 2015-04-04 ENCOUNTER — Ambulatory Visit (INDEPENDENT_AMBULATORY_CARE_PROVIDER_SITE_OTHER): Payer: Medicare HMO | Admitting: Gynecology

## 2015-04-04 ENCOUNTER — Encounter: Payer: Self-pay | Admitting: Gynecology

## 2015-04-04 VITALS — BP 124/70 | Ht 62.0 in | Wt 163.0 lb

## 2015-04-04 DIAGNOSIS — N8111 Cystocele, midline: Secondary | ICD-10-CM

## 2015-04-04 DIAGNOSIS — N952 Postmenopausal atrophic vaginitis: Secondary | ICD-10-CM | POA: Diagnosis not present

## 2015-04-04 DIAGNOSIS — Z01419 Encounter for gynecological examination (general) (routine) without abnormal findings: Secondary | ICD-10-CM | POA: Diagnosis not present

## 2015-04-04 DIAGNOSIS — M8580 Other specified disorders of bone density and structure, unspecified site: Secondary | ICD-10-CM

## 2015-04-04 NOTE — Progress Notes (Signed)
Larua Collier Breden 08-12-1941 [id number]        74 y.o.  [id number] for breast and pelvic exam.  Several issues noted below.  Past medical history,surgical history, problem list, medications, allergies, family history and social history were all reviewed and documented as reviewed in the EPIC chart.  ROS:  Performed with pertinent positives and negatives included in the history, assessment and plan.   Additional significant findings :  none   Exam:  Kim Counsellor Vitals:   04/04/15 1048  BP: 124/70  Height: 5\' 2"  (1.575 m)  Weight: 163 lb (73.936 kg)   General appearance:  Normal affect, orientation and appearance. Skin: Grossly normal HEENT: Without gross lesions.  No cervical or supraclavicular adenopathy. Thyroid normal.  Lungs:  Clear without wheezing, rales or rhonchi Cardiac: RR, without RMG Abdominal:  Soft, nontender, without masses, guarding, rebound, organomegaly or hernia Breasts:  Examined lying and sitting without masses, retractions, discharge or axillary adenopathy. Pelvic:  Ext/BUS/vagina with generalized atrophic changes.  Mild cystocele noted  Adnexa  Without masses or tenderness    Anus and perineum  Normal   Rectovaginal  Normal sphincter tone without palpated masses or tenderness.    Assessment/Plan:  74 y.o. G10P2002 female for breast and pelvic exam.   1. 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 Has been using Estrace now Premarin vaginal cream once weekly with good results and wants to continue. I reviewed the absorption risk issue with her to include increased risk of stroke heart attack DVT and breast cancer. Patient's comfortable continuing. She has a supply at home but will call when she needs more Premarin. Not having significant hot flashes or night sweats. 2. Mild cystocele. Does have some urge symptoms. Not overly bothersome to the patient. Does not desire intervention at this point. Check urinalysis today.  We'll call it becomes an issue. 3. Pap smear 2011. No Pap smear done today. No history of abnormal Pap smears previously. Given her age and hysterectomy history we are both comfortable with stop screening. 4. Mammography 11/2014. Continue with annual mammography. SBE monthly reviewed. 5. Osteopenia. DEXA 04/2014 T score -1.9. FRAX not done. Is being followed by her primary physician for this. Plans repeat DEXA next year a two-year interval. Increased calcium vitamin D recommendations reviewed. 6. Colonoscopy 2015. Repeat at their recommended interval. 7. Health maintenance. No routine blood work done as patient reports this done at her primary physician's office. Follow up in one year, sooner as needed.     Anastasio Auerbach MD, 11:33 AM 04/04/2015

## 2015-04-04 NOTE — Patient Instructions (Signed)
Use the Premarin vaginal cream as we discussed. Call me if you have any issues with this.  You may obtain a copy of any labs that were done today by logging onto MyChart as outlined in the instructions provided with your AVS (after visit summary). The office will not call with normal lab results but certainly if there are any significant abnormalities then we will contact you.   Health Maintenance, Female A healthy lifestyle and preventative care can promote health and wellness.  Maintain regular health, dental, and eye exams.  Eat a healthy diet. Foods like vegetables, fruits, whole grains, low-fat dairy products, and lean protein foods contain the nutrients you need without too many calories. Decrease your intake of foods high in solid fats, added sugars, and salt. Get information about a proper diet from your caregiver, if necessary.  Regular physical exercise is one of the most important things you can do for your health. Most adults should get at least 150 minutes of moderate-intensity exercise (any activity that increases your heart rate and causes you to sweat) each week. In addition, most adults need muscle-strengthening exercises on 2 or more days a week.   Maintain a healthy weight. The body mass index (BMI) is a screening tool to identify possible weight problems. It provides an estimate of body fat based on height and weight. Your caregiver can help determine your BMI, and can help you achieve or maintain a healthy weight. For adults 20 years and older:  A BMI below 18.5 is considered underweight.  A BMI of 18.5 to 24.9 is normal.  A BMI of 25 to 29.9 is considered overweight.  A BMI of 30 and above is considered obese.  Maintain normal blood lipids and cholesterol by exercising and minimizing your intake of saturated fat. Eat a balanced diet with plenty of fruits and vegetables. Blood tests for lipids and cholesterol should begin at age 74 and be repeated every 5 years. If your  lipid or cholesterol levels are high, you are over 50, or you are a high risk for heart disease, you may need your cholesterol levels checked more frequently.Ongoing high lipid and cholesterol levels should be treated with medicines if diet and exercise are not effective.  If you smoke, find out from your caregiver how to quit. If you do not use tobacco, do not start.  Lung cancer screening is recommended for adults aged 4 80 years who are at high risk for developing lung cancer because of a history of smoking. Yearly low-dose computed tomography (CT) is recommended for people who have at least a 30-pack-year history of smoking and are a current smoker or have quit within the past 15 years. A pack year of smoking is smoking an average of 1 pack of cigarettes a day for 1 year (for example: 1 pack a day for 30 years or 2 packs a day for 15 years). Yearly screening should continue until the smoker has stopped smoking for at least 15 years. Yearly screening should also be stopped for people who develop a health problem that would prevent them from having lung cancer treatment.  If you are pregnant, do not drink alcohol. If you are breastfeeding, be very cautious about drinking alcohol. If you are not pregnant and choose to drink alcohol, do not exceed 1 drink per day. One drink is considered to be 12 ounces (355 mL) of beer, 5 ounces (148 mL) of wine, or 1.5 ounces (44 mL) of liquor.  Avoid use of street  drugs. Do not share needles with anyone. Ask for help if you need support or instructions about stopping the use of drugs.  High blood pressure causes heart disease and increases the risk of stroke. Blood pressure should be checked at least every 1 to 2 years. Ongoing high blood pressure should be treated with medicines, if weight loss and exercise are not effective.  If you are 71 to 75 years old, ask your caregiver if you should take aspirin to prevent strokes.  Diabetes screening involves taking a  blood sample to check your fasting blood sugar level. This should be done once every 3 years, after age 2, if you are within normal weight and without risk factors for diabetes. Testing should be considered at a younger age or be carried out more frequently if you are overweight and have at least 1 risk factor for diabetes.  Breast cancer screening is essential preventative care for women. You should practice "breast self-awareness." This means understanding the normal appearance and feel of your breasts and may include breast self-examination. Any changes detected, no matter how small, should be reported to a caregiver. Women in their 17s and 30s should have a clinical breast exam (CBE) by a caregiver as part of a regular health exam every 1 to 3 years. After age 35, women should have a CBE every year. Starting at age 90, women should consider having a mammogram (breast X-ray) every year. Women who have a family history of breast cancer should talk to their caregiver about genetic screening. Women at a high risk of breast cancer should talk to their caregiver about having an MRI and a mammogram every year.  Breast cancer gene (BRCA)-related cancer risk assessment is recommended for women who have family members with BRCA-related cancers. BRCA-related cancers include breast, ovarian, tubal, and peritoneal cancers. Having family members with these cancers may be associated with an increased risk for harmful changes (mutations) in the breast cancer genes BRCA1 and BRCA2. Results of the assessment will determine the need for genetic counseling and BRCA1 and BRCA2 testing.  The Pap test is a screening test for cervical cancer. Women should have a Pap test starting at age 8. Between ages 27 and 48, Pap tests should be repeated every 2 years. Beginning at age 28, you should have a Pap test every 3 years as long as the past 3 Pap tests have been normal. If you had a hysterectomy for a problem that was not cancer or  a condition that could lead to cancer, then you no longer need Pap tests. If you are between ages 67 and 67, and you have had normal Pap tests going back 10 years, you no longer need Pap tests. If you have had past treatment for cervical cancer or a condition that could lead to cancer, you need Pap tests and screening for cancer for at least 20 years after your treatment. If Pap tests have been discontinued, risk factors (such as a new sexual partner) need to be reassessed to determine if screening should be resumed. Some women have medical problems that increase the chance of getting cervical cancer. In these cases, your caregiver may recommend more frequent screening and Pap tests.  The human papillomavirus (HPV) test is an additional test that may be used for cervical cancer screening. The HPV test looks for the virus that can cause the cell changes on the cervix. The cells collected during the Pap test can be tested for HPV. The HPV test could be  used to screen women aged 58 years and older, and should be used in women of any age who have unclear Pap test results. After the age of 73, women should have HPV testing at the same frequency as a Pap test.  Colorectal cancer can be detected and often prevented. Most routine colorectal cancer screening begins at the age of 67 and continues through age 43. However, your caregiver may recommend screening at an earlier age if you have risk factors for colon cancer. On a yearly basis, your caregiver may provide home test kits to check for hidden blood in the stool. Use of a small camera at the end of a tube, to directly examine the colon (sigmoidoscopy or colonoscopy), can detect the earliest forms of colorectal cancer. Talk to your caregiver about this at age 53, when routine screening begins. Direct examination of the colon should be repeated every 5 to 10 years through age 90, unless early forms of pre-cancerous polyps or small growths are found.  Hepatitis C  blood testing is recommended for all people born from [birth year] through 1966 and any individual with known risks for hepatitis C.  Practice safe sex. Use condoms and avoid high-risk sexual practices to reduce the spread of sexually transmitted infections (STIs). Sexually active women aged 29 and younger should be checked for Chlamydia, which is a common sexually transmitted infection. Older women with new or multiple partners should also be tested for Chlamydia. Testing for other STIs is recommended if you are sexually active and at increased risk.  Osteoporosis is a disease in which the bones lose minerals and strength with aging. This can result in serious bone fractures. The risk of osteoporosis can be identified using a bone density scan. Women ages 17 and over and women at risk for fractures or osteoporosis should discuss screening with their caregivers. Ask your caregiver whether you should be taking a calcium supplement or vitamin D to reduce the rate of osteoporosis.  Menopause can be associated with physical symptoms and risks. Hormone replacement therapy is available to decrease symptoms and risks. You should talk to your caregiver about whether hormone replacement therapy is right for you.  Use sunscreen. Apply sunscreen liberally and repeatedly throughout the day. You should seek shade when your shadow is shorter than you. Protect yourself by wearing long sleeves, pants, a wide-brimmed hat, and sunglasses year round, whenever you are outdoors.  Notify your caregiver of new moles or changes in moles, especially if there is a change in shape or color. Also notify your caregiver if a mole is larger than the size of a pencil eraser.  Stay current with your immunizations. Document Released: 06/16/2011 Document Revised: 03/28/2013 Document Reviewed: 06/16/2011 [facility] Patient Information 2014 [place].

## 2015-04-05 LAB — URINALYSIS W MICROSCOPIC + REFLEX CULTURE
Bacteria, UA: NONE SEEN
Bilirubin Urine: NEGATIVE
CASTS: NONE SEEN
Crystals: NONE SEEN
Glucose, UA: NEGATIVE mg/dL
HGB URINE DIPSTICK: NEGATIVE
Ketones, ur: NEGATIVE mg/dL
LEUKOCYTES UA: NEGATIVE
NITRITE: NEGATIVE
PH: 7 (ref 5.0–8.0)
Protein, ur: NEGATIVE mg/dL
SPECIFIC GRAVITY, URINE: 1.006 (ref 1.005–1.030)
Squamous Epithelial / LPF: NONE SEEN
Urobilinogen, UA: 0.2 mg/dL (ref 0.0–1.0)

## 2015-09-12 ENCOUNTER — Other Ambulatory Visit: Payer: Self-pay | Admitting: Gynecology

## 2016-01-17 ENCOUNTER — Other Ambulatory Visit: Payer: Self-pay

## 2016-01-17 DIAGNOSIS — Z1231 Encounter for screening mammogram for malignant neoplasm of breast: Secondary | ICD-10-CM

## 2016-01-18 ENCOUNTER — Ambulatory Visit
Admission: RE | Admit: 2016-01-18 | Discharge: 2016-01-18 | Disposition: A | Discharge status: Home / Self Care | Payer: PPO | Source: Ambulatory Visit

## 2016-01-18 DIAGNOSIS — Z1231 Encounter for screening mammogram for malignant neoplasm of breast: Secondary | ICD-10-CM | POA: Diagnosis not present

## 2016-01-31 DIAGNOSIS — I10 Essential (primary) hypertension: Secondary | ICD-10-CM | POA: Diagnosis not present

## 2016-01-31 DIAGNOSIS — E785 Hyperlipidemia, unspecified: Secondary | ICD-10-CM | POA: Diagnosis not present

## 2016-01-31 DIAGNOSIS — M8580 Other specified disorders of bone density and structure, unspecified site: Secondary | ICD-10-CM | POA: Diagnosis not present

## 2016-01-31 DIAGNOSIS — Z0000 Encounter for general adult medical examination without abnormal findings: Secondary | ICD-10-CM | POA: Diagnosis not present

## 2016-02-04 ENCOUNTER — Other Ambulatory Visit: Payer: Self-pay | Admitting: Physician Assistant

## 2016-02-04 DIAGNOSIS — M8580 Other specified disorders of bone density and structure, unspecified site: Secondary | ICD-10-CM

## 2016-02-04 DIAGNOSIS — R7301 Impaired fasting glucose: Secondary | ICD-10-CM | POA: Diagnosis not present

## 2016-02-04 DIAGNOSIS — Z0000 Encounter for general adult medical examination without abnormal findings: Secondary | ICD-10-CM | POA: Diagnosis not present

## 2016-02-04 DIAGNOSIS — I10 Essential (primary) hypertension: Secondary | ICD-10-CM | POA: Diagnosis not present

## 2016-02-04 DIAGNOSIS — F419 Anxiety disorder, unspecified: Secondary | ICD-10-CM | POA: Diagnosis not present

## 2016-02-04 DIAGNOSIS — E785 Hyperlipidemia, unspecified: Secondary | ICD-10-CM | POA: Diagnosis not present

## 2016-04-25 ENCOUNTER — Ambulatory Visit
Admission: RE | Admit: 2016-04-25 | Discharge: 2016-04-25 | Disposition: A | Discharge status: Home / Self Care | Payer: PPO | Source: Ambulatory Visit | Attending: Physician Assistant | Admitting: Physician Assistant

## 2016-04-25 DIAGNOSIS — M8589 Other specified disorders of bone density and structure, multiple sites: Secondary | ICD-10-CM | POA: Diagnosis not present

## 2016-04-25 DIAGNOSIS — Z780 Asymptomatic menopausal state: Secondary | ICD-10-CM | POA: Diagnosis not present

## 2016-04-25 DIAGNOSIS — M8580 Other specified disorders of bone density and structure, unspecified site: Secondary | ICD-10-CM

## 2016-05-21 ENCOUNTER — Encounter: Payer: Self-pay | Admitting: Gynecology

## 2016-05-21 ENCOUNTER — Ambulatory Visit (INDEPENDENT_AMBULATORY_CARE_PROVIDER_SITE_OTHER): Payer: PPO | Admitting: Gynecology

## 2016-05-21 VITALS — BP 120/70 | Ht 62.0 in | Wt 156.0 lb

## 2016-05-21 DIAGNOSIS — N952 Postmenopausal atrophic vaginitis: Secondary | ICD-10-CM

## 2016-05-21 DIAGNOSIS — N8111 Cystocele, midline: Secondary | ICD-10-CM

## 2016-05-21 DIAGNOSIS — M8580 Other specified disorders of bone density and structure, unspecified site: Secondary | ICD-10-CM

## 2016-05-21 DIAGNOSIS — Z01419 Encounter for gynecological examination (general) (routine) without abnormal findings: Secondary | ICD-10-CM | POA: Diagnosis not present

## 2016-05-21 MED ORDER — ESTROGENS, CONJUGATED 0.625 MG/GM VA CREA: TOPICAL_CREAM | VAGINAL | Status: DC

## 2016-05-21 NOTE — Patient Instructions (Signed)
Check with [facility] to see if their vaginal estrogen cream is cheaper than Premarin.

## 2016-05-21 NOTE — Progress Notes (Signed)
    Faeryn Mundine Zammit 06-Aug-1941 [id number]        75 y.o.  [id number]  for breast and pelvic exam. Several issues noted below.  Past medical history,surgical history, problem list, medications, allergies, family history and social history were all reviewed and documented as reviewed in the EPIC chart.  ROS:  Performed with pertinent positives and negatives included in the history, assessment and plan.   Additional significant findings :  None   Exam: [name] assistant Filed Vitals:   05/21/16 1103  BP: 120/70  Height: 5\' 2"  (1.575 m)  Weight: 156 lb (70.761 kg)   General appearance:  Normal affect, orientation and appearance. Skin: Grossly normal HEENT: Without gross lesions.  No cervical or supraclavicular adenopathy. Thyroid normal.  Lungs:  Clear without wheezing, rales or rhonchi Cardiac: RR, without RMG Abdominal:  Soft, nontender, without masses, guarding, rebound, organomegaly or hernia Breasts:  Examined lying and sitting without masses, retractions, discharge or axillary adenopathy. Pelvic:  Ext/BUS/vagina with atrophic changes. Mild cystocele noted.  Adnexa without masses or tenderness    Anus and perineum normal   Rectovaginal normal sphincter tone without palpated masses or tenderness.    Assessment/Plan:  75 y.o. [id number] female for breast and pelvic exam.   1. Postmenopausal/atrophic genital changes. Status post TVH A&P repair. Uses Premarin cream once weekly for vaginal dryness and is doing well with this. Requests refill. Refill given. I again reviewed the risks of absorption with systemic effects and risks such as thrombosis, breast cancer. Patient's comfortable continuing. She's going to check with the pharmacy as far as getting formulated estradiol cream to see if it's cheaper. Call us if she needs a prescription. 2. Mammography 01/2016. Continue with annual mammography when due. SBE monthly reviewed. 3. DEXA 04/2016 T score -1.8 stable from prior DEXA. FRAX  not calculated.  Increase calcium vitamin D reviewed. 4. Colonoscopy 2015. Repeat at their recommended interval. 5. Pap smear 2011. No Pap smear done today. No history of significant abnormal Pap smears. Status post hysterectomy for benign indications. Over the age 44. We both agree per current screening guidelines to stop screening. 6. Health maintenance. No routine lab work done as patient reports this done at her primary physician's office. Follow up 1 year, sooner as needed.   Anastasio Auerbach MD, 11:48 AM 05/21/2016

## 2016-06-06 DIAGNOSIS — D2262 Melanocytic nevi of left upper limb, including shoulder: Secondary | ICD-10-CM | POA: Diagnosis not present

## 2016-06-06 DIAGNOSIS — L304 Erythema intertrigo: Secondary | ICD-10-CM | POA: Diagnosis not present

## 2016-06-06 DIAGNOSIS — D225 Melanocytic nevi of trunk: Secondary | ICD-10-CM | POA: Diagnosis not present

## 2016-06-06 DIAGNOSIS — L820 Inflamed seborrheic keratosis: Secondary | ICD-10-CM | POA: Diagnosis not present

## 2016-06-06 DIAGNOSIS — L718 Other rosacea: Secondary | ICD-10-CM | POA: Diagnosis not present

## 2016-06-06 DIAGNOSIS — L570 Actinic keratosis: Secondary | ICD-10-CM | POA: Diagnosis not present

## 2016-06-06 DIAGNOSIS — L821 Other seborrheic keratosis: Secondary | ICD-10-CM | POA: Diagnosis not present

## 2016-07-17 DIAGNOSIS — S8002XD Contusion of left knee, subsequent encounter: Secondary | ICD-10-CM | POA: Diagnosis not present

## 2016-07-17 DIAGNOSIS — M25572 Pain in left ankle and joints of left foot: Secondary | ICD-10-CM | POA: Diagnosis not present

## 2016-07-17 DIAGNOSIS — M7052 Other bursitis of knee, left knee: Secondary | ICD-10-CM | POA: Diagnosis not present

## 2016-07-17 DIAGNOSIS — M25562 Pain in left knee: Secondary | ICD-10-CM | POA: Diagnosis not present

## 2016-07-29 DIAGNOSIS — H0011 Chalazion right upper eyelid: Secondary | ICD-10-CM | POA: Diagnosis not present

## 2016-08-13 DIAGNOSIS — H26493 Other secondary cataract, bilateral: Secondary | ICD-10-CM | POA: Diagnosis not present

## 2016-08-14 DIAGNOSIS — E7800 Pure hypercholesterolemia, unspecified: Secondary | ICD-10-CM | POA: Diagnosis not present

## 2016-08-14 DIAGNOSIS — I10 Essential (primary) hypertension: Secondary | ICD-10-CM | POA: Diagnosis not present

## 2016-08-14 DIAGNOSIS — I868 Varicose veins of other specified sites: Secondary | ICD-10-CM | POA: Diagnosis not present

## 2016-08-14 DIAGNOSIS — K219 Gastro-esophageal reflux disease without esophagitis: Secondary | ICD-10-CM | POA: Diagnosis not present

## 2016-08-14 DIAGNOSIS — F322 Major depressive disorder, single episode, severe without psychotic features: Secondary | ICD-10-CM | POA: Diagnosis not present

## 2017-02-09 ENCOUNTER — Other Ambulatory Visit: Payer: Self-pay | Admitting: Family Medicine

## 2017-02-09 DIAGNOSIS — Z1231 Encounter for screening mammogram for malignant neoplasm of breast: Secondary | ICD-10-CM

## 2017-02-25 ENCOUNTER — Ambulatory Visit
Admission: RE | Admit: 2017-02-25 | Discharge: 2017-02-25 | Disposition: A | Discharge status: Home / Self Care | Payer: PPO | Source: Ambulatory Visit | Attending: Family Medicine | Admitting: Family Medicine

## 2017-02-25 DIAGNOSIS — Z1231 Encounter for screening mammogram for malignant neoplasm of breast: Secondary | ICD-10-CM

## 2017-03-11 DIAGNOSIS — E7800 Pure hypercholesterolemia, unspecified: Secondary | ICD-10-CM | POA: Diagnosis not present

## 2017-03-11 DIAGNOSIS — N390 Urinary tract infection, site not specified: Secondary | ICD-10-CM | POA: Diagnosis not present

## 2017-03-11 DIAGNOSIS — Z0000 Encounter for general adult medical examination without abnormal findings: Secondary | ICD-10-CM | POA: Diagnosis not present

## 2017-03-11 DIAGNOSIS — F322 Major depressive disorder, single episode, severe without psychotic features: Secondary | ICD-10-CM | POA: Diagnosis not present

## 2017-03-11 DIAGNOSIS — Z79899 Other long term (current) drug therapy: Secondary | ICD-10-CM | POA: Diagnosis not present

## 2017-03-11 DIAGNOSIS — M8588 Other specified disorders of bone density and structure, other site: Secondary | ICD-10-CM | POA: Diagnosis not present

## 2017-03-11 DIAGNOSIS — R102 Pelvic and perineal pain: Secondary | ICD-10-CM | POA: Diagnosis not present

## 2017-03-11 DIAGNOSIS — E559 Vitamin D deficiency, unspecified: Secondary | ICD-10-CM | POA: Diagnosis not present

## 2017-03-11 DIAGNOSIS — Z23 Encounter for immunization: Secondary | ICD-10-CM | POA: Diagnosis not present

## 2017-04-23 DIAGNOSIS — L821 Other seborrheic keratosis: Secondary | ICD-10-CM | POA: Diagnosis not present

## 2017-04-23 DIAGNOSIS — L570 Actinic keratosis: Secondary | ICD-10-CM | POA: Diagnosis not present

## 2017-04-23 DIAGNOSIS — L820 Inflamed seborrheic keratosis: Secondary | ICD-10-CM | POA: Diagnosis not present

## 2017-04-23 DIAGNOSIS — D2271 Melanocytic nevi of right lower limb, including hip: Secondary | ICD-10-CM | POA: Diagnosis not present

## 2017-04-23 DIAGNOSIS — L719 Rosacea, unspecified: Secondary | ICD-10-CM | POA: Diagnosis not present

## 2017-04-27 ENCOUNTER — Other Ambulatory Visit: Payer: Self-pay | Admitting: Gynecology

## 2017-04-27 ENCOUNTER — Encounter: Payer: Self-pay | Admitting: Gynecology

## 2017-04-27 ENCOUNTER — Ambulatory Visit (INDEPENDENT_AMBULATORY_CARE_PROVIDER_SITE_OTHER): Payer: PPO | Admitting: Gynecology

## 2017-04-27 ENCOUNTER — Telehealth: Payer: Self-pay | Admitting: *Deleted

## 2017-04-27 VITALS — BP 118/74 | Ht 62.5 in | Wt 150.0 lb

## 2017-04-27 DIAGNOSIS — M8580 Other specified disorders of bone density and structure, unspecified site: Secondary | ICD-10-CM | POA: Diagnosis not present

## 2017-04-27 DIAGNOSIS — N390 Urinary tract infection, site not specified: Secondary | ICD-10-CM | POA: Diagnosis not present

## 2017-04-27 DIAGNOSIS — R102 Pelvic and perineal pain: Secondary | ICD-10-CM

## 2017-04-27 DIAGNOSIS — Z01411 Encounter for gynecological examination (general) (routine) with abnormal findings: Secondary | ICD-10-CM | POA: Diagnosis not present

## 2017-04-27 DIAGNOSIS — N952 Postmenopausal atrophic vaginitis: Secondary | ICD-10-CM

## 2017-04-27 LAB — URINALYSIS W MICROSCOPIC + REFLEX CULTURE
Bilirubin Urine: NEGATIVE
CRYSTALS: NONE SEEN [HPF]
Casts: NONE SEEN [LPF]
Glucose, UA: NEGATIVE
HGB URINE DIPSTICK: NEGATIVE
Ketones, ur: NEGATIVE
Nitrite: NEGATIVE
PH: 7.5 (ref 5.0–8.0)
Protein, ur: NEGATIVE
RBC / HPF: NONE SEEN RBC/HPF (ref <=2)
SPECIFIC GRAVITY, URINE: 1.015 (ref 1.001–1.035)
YEAST: NONE SEEN [HPF]

## 2017-04-27 MED ORDER — NITROFURANTOIN MACROCRYSTAL 100 MG PO CAPS: 100.0000 mg | ORAL_CAPSULE | Freq: Two times a day (BID) | ORAL | 0 refills | Status: DC

## 2017-04-27 NOTE — Telephone Encounter (Signed)
Pt was seen today and Rx was never sent to pharmacy. pharmacist said pt has been called all day to get Rx. Per note "Recommend Macrodantin 100 mg twice a day 7 days. " Rx will be sent pt can pick up

## 2017-04-27 NOTE — Progress Notes (Signed)
    Brittany Douglas [birth year] [id number]        76 y.o.  [id number] for breast and pelvic exam. Patient also is complaining of pelvic pressure. Was recently treated for UTI with ciprofloxacin for 3 days. Her symptoms of frequency dysuria seem to improve but the pressure never seems to go away. No fever or chills. No low back pain.  Past medical history,surgical history, problem list, medications, allergies, family history and social history were all reviewed and documented as reviewed in the EPIC chart.  ROS:  Performed with pertinent positives and negatives included in the history, assessment and plan.   Additional significant findings :  none   Exam: [name] assistant Vitals:   04/27/17 1107  BP: 118/74  Weight: 150 lb (68 kg)  Height: 5' 2.5" (1.588 m)   Body mass index is 27 kg/m.  General appearance:  Normal affect, orientation and appearance. Skin: Grossly normal HEENT: Without gross lesions.  No cervical or supraclavicular adenopathy. Thyroid normal.  Lungs:  Clear without wheezing, rales or rhonchi Cardiac: RR, without RMG Abdominal:  Soft, nontender, without masses, guarding, rebound, organomegaly or hernia Breasts:  Examined lying and sitting without masses, retractions, discharge or axillary adenopathy. Pelvic:  Ext, BUS, Vagina: with atrophic changes  Adnexa: Without masses or tenderness    Anus and perineum: Normal   Rectovaginal: Normal sphincter tone without palpated masses or tenderness.    Assessment/Plan:  76 y.o. [id number] female for breast and pelvic exam.   1. Postmenopausal/atrophic genital changes. Status post TVH A&P repair. Uses Premarin vaginal cream externally once weekly which helps with vaginal dryness. She wants to continue. We've talked about absorption risks and systemic effects to include thrombosis of breast cancer. Options to switched to the generic estradiol cream discussed and she wants to try this. 2. Pelvic pressure. Exam is normal  other than atrophic changes. Urinalysis is consistent with UTI. Recommend Macrodantin 100 mg twice a day 7 days. Follow up if symptoms persist. I suspect the 3 day course of ciprofloxacin was not long enough to eradicate her infection. 3. Mammography 02/2017. Continue with annual mammography when due. SBE monthly reviewed. 4. Osteopenia. DEXA 2017 T score -1.8 stable from prior DEXA. Plan repeat DEXA next year at 2 year interval. 5. Pap smear 2011. No Pap smear done today. Per current screening guidelines based on age in hysterectomy history with no history of abnormal Pap smears previously we both agree to stop screening. 6. Colonoscopy 2015. Repeat at their recommended interval. 7. Health maintenance. No routine lab work done as patient does this elsewhere. Follow up 1 year, sooner as needed.  Additional time in excess of her breast and pelvic exam was spent in direct face to face counseling and coordination of care in regards to her UTI.    Anastasio Auerbach MD, 12:06 PM 04/27/2017

## 2017-04-27 NOTE — Addendum Note (Signed)
Addended by: Nelva Nay on: 04/27/2017 12:23 PM   Modules accepted: Orders

## 2017-04-27 NOTE — Patient Instructions (Signed)
Take antibiotic pill twice daily for 7 days

## 2017-04-30 LAB — URINE CULTURE

## 2017-08-14 DIAGNOSIS — H26493 Other secondary cataract, bilateral: Secondary | ICD-10-CM | POA: Diagnosis not present

## 2017-08-18 DIAGNOSIS — I739 Peripheral vascular disease, unspecified: Secondary | ICD-10-CM | POA: Diagnosis not present

## 2017-12-24 ENCOUNTER — Telehealth: Payer: Self-pay | Admitting: *Deleted

## 2017-12-24 MED ORDER — ESTROGENS, CONJUGATED 0.625 MG/GM VA CREA: TOPICAL_CREAM | VAGINAL | 0 refills | Status: DC

## 2017-12-24 NOTE — Telephone Encounter (Signed)
Patient has annual scheduled on 04/29/18, needs refill on premarin vaginal cream, tried the estrace and didn't like it so she went back on premarin. Uses only once weekly to help with vaginal dryness. Please advise

## 2017-12-24 NOTE — Telephone Encounter (Signed)
Rx sent 

## 2017-12-24 NOTE — Telephone Encounter (Signed)
Okay to refill 1 tube

## 2018-01-12 DIAGNOSIS — R350 Frequency of micturition: Secondary | ICD-10-CM | POA: Diagnosis not present

## 2018-01-19 DIAGNOSIS — I10 Essential (primary) hypertension: Secondary | ICD-10-CM | POA: Diagnosis not present

## 2018-01-19 DIAGNOSIS — R040 Epistaxis: Secondary | ICD-10-CM | POA: Diagnosis not present

## 2018-02-12 DIAGNOSIS — F322 Major depressive disorder, single episode, severe without psychotic features: Secondary | ICD-10-CM | POA: Diagnosis not present

## 2018-02-12 DIAGNOSIS — N3000 Acute cystitis without hematuria: Secondary | ICD-10-CM | POA: Diagnosis not present

## 2018-02-12 DIAGNOSIS — I10 Essential (primary) hypertension: Secondary | ICD-10-CM | POA: Diagnosis not present

## 2018-03-01 ENCOUNTER — Other Ambulatory Visit: Payer: Self-pay | Admitting: Family Medicine

## 2018-03-01 DIAGNOSIS — Z1231 Encounter for screening mammogram for malignant neoplasm of breast: Secondary | ICD-10-CM

## 2018-03-05 DIAGNOSIS — I10 Essential (primary) hypertension: Secondary | ICD-10-CM | POA: Diagnosis not present

## 2018-03-05 DIAGNOSIS — F419 Anxiety disorder, unspecified: Secondary | ICD-10-CM | POA: Diagnosis not present

## 2018-03-19 ENCOUNTER — Ambulatory Visit
Admission: RE | Admit: 2018-03-19 | Discharge: 2018-03-19 | Disposition: A | Discharge status: Home / Self Care | Payer: PPO | Source: Ambulatory Visit | Attending: Family Medicine | Admitting: Family Medicine

## 2018-03-19 DIAGNOSIS — Z1231 Encounter for screening mammogram for malignant neoplasm of breast: Secondary | ICD-10-CM

## 2018-04-08 DIAGNOSIS — K589 Irritable bowel syndrome without diarrhea: Secondary | ICD-10-CM | POA: Diagnosis not present

## 2018-04-08 DIAGNOSIS — E7800 Pure hypercholesterolemia, unspecified: Secondary | ICD-10-CM | POA: Diagnosis not present

## 2018-04-08 DIAGNOSIS — Z79899 Other long term (current) drug therapy: Secondary | ICD-10-CM | POA: Diagnosis not present

## 2018-04-08 DIAGNOSIS — Z0000 Encounter for general adult medical examination without abnormal findings: Secondary | ICD-10-CM | POA: Diagnosis not present

## 2018-04-08 DIAGNOSIS — F339 Major depressive disorder, recurrent, unspecified: Secondary | ICD-10-CM | POA: Diagnosis not present

## 2018-04-08 DIAGNOSIS — E559 Vitamin D deficiency, unspecified: Secondary | ICD-10-CM | POA: Diagnosis not present

## 2018-04-08 DIAGNOSIS — I10 Essential (primary) hypertension: Secondary | ICD-10-CM | POA: Diagnosis not present

## 2018-04-08 DIAGNOSIS — M8580 Other specified disorders of bone density and structure, unspecified site: Secondary | ICD-10-CM | POA: Diagnosis not present

## 2018-04-08 DIAGNOSIS — K219 Gastro-esophageal reflux disease without esophagitis: Secondary | ICD-10-CM | POA: Diagnosis not present

## 2018-04-08 DIAGNOSIS — R350 Frequency of micturition: Secondary | ICD-10-CM | POA: Diagnosis not present

## 2018-04-29 ENCOUNTER — Encounter: Payer: PPO | Admitting: Gynecology

## 2018-04-30 ENCOUNTER — Ambulatory Visit (INDEPENDENT_AMBULATORY_CARE_PROVIDER_SITE_OTHER): Payer: PPO | Admitting: Gynecology

## 2018-04-30 ENCOUNTER — Encounter: Payer: Self-pay | Admitting: Gynecology

## 2018-04-30 VITALS — BP 134/74 | Ht 63.0 in | Wt 145.0 lb

## 2018-04-30 DIAGNOSIS — Z01419 Encounter for gynecological examination (general) (routine) without abnormal findings: Secondary | ICD-10-CM | POA: Diagnosis not present

## 2018-04-30 DIAGNOSIS — M8580 Other specified disorders of bone density and structure, unspecified site: Secondary | ICD-10-CM

## 2018-04-30 DIAGNOSIS — N952 Postmenopausal atrophic vaginitis: Secondary | ICD-10-CM

## 2018-04-30 NOTE — Progress Notes (Signed)
    Brittany Douglas Jun 24, 1941 [id number]        77 y.o.  [id number] for breast and pelvic exam.  Doing well without gynecologic complaints.  Past medical history,surgical history, problem list, medications, allergies, family history and social history were all reviewed and documented as reviewed in the EPIC chart.  ROS:  Performed with pertinent positives and negatives included in the history, assessment and plan.   Additional significant findings : None   Exam: Brittany Douglas assistant Vitals:   04/30/18 1103  BP: 134/74  Weight: 145 lb (65.8 kg)  Height: 5\' 3"  (1.6 m)   Body mass index is 25.69 kg/m.  General appearance:  Normal affect, orientation and appearance. Skin: Grossly normal HEENT: Without gross lesions.  No cervical or supraclavicular adenopathy. Thyroid normal.  Lungs:  Clear without wheezing, rales or rhonchi Cardiac: RR, without RMG Abdominal:  Soft, nontender, without masses, guarding, rebound, organomegaly or hernia Breasts:  Examined lying and sitting without masses, retractions, discharge or axillary adenopathy. Pelvic:  Ext, BUS, Vagina: With atrophic changes  Adnexa: Without masses or tenderness    Anus and perineum: Normal   Rectovaginal: Normal sphincter tone without palpated masses or tenderness.    Assessment/Plan:  77 y.o. [id number] female for breast and pelvic exam.   1. Postmenopausal/atrophic genital changes.  Status post TVH A&P repair.  Uses Premarin cream externally once weekly for vaginal support.  Has been having some recurrence of UTIs and her primary provider suggested increasing to twice weekly.  I reviewed with her the studies supporting use of vaginal estrogen cream to help decrease recurrent UTIs in postmenopausal women.  She is going to go ahead and start using this twice weekly.  We discussed the absorption risks and she is comfortable continuing.  She has a supply at home but will call when she needs more. 2. Mammography 03/2018.   Continue with annual mammography next year.  Breast exam normal today. 3. Osteopenia.  DEXA 2017 T score -1.8.  She has a DEXA scheduled in June and will follow-up for this. 4. Colonoscopy 2015.  Repeat at their recommended interval. 5. Pap smear 2011.  No Pap smear done today.  We rediscussed current screening guidelines and we are both comfortable with stop screening based on age and hysterectomy history. 6. Health maintenance.  No routine lab work done as patient does this elsewhere.  Follow-up 1 year, sooner as needed.   Brittany Auerbach MD, 11:48 AM 04/30/2018

## 2018-04-30 NOTE — Patient Instructions (Signed)
Follow-up for bone density as scheduled.  Follow-up in 1 year for annual exam, sooner if any issues. 

## 2018-05-05 ENCOUNTER — Other Ambulatory Visit: Payer: Self-pay

## 2018-05-06 DIAGNOSIS — L820 Inflamed seborrheic keratosis: Secondary | ICD-10-CM | POA: Diagnosis not present

## 2018-05-06 DIAGNOSIS — L719 Rosacea, unspecified: Secondary | ICD-10-CM | POA: Diagnosis not present

## 2018-05-06 DIAGNOSIS — K1379 Other lesions of oral mucosa: Secondary | ICD-10-CM | POA: Diagnosis not present

## 2018-05-06 DIAGNOSIS — L821 Other seborrheic keratosis: Secondary | ICD-10-CM | POA: Diagnosis not present

## 2018-05-17 ENCOUNTER — Telehealth: Payer: Self-pay

## 2018-05-17 NOTE — Patient Outreach (Signed)
Called [facility] member to schedule a follow up appointment, there was no answer, left message to call me back.

## 2018-05-21 ENCOUNTER — Other Ambulatory Visit: Payer: Self-pay | Admitting: *Deleted

## 2018-05-24 ENCOUNTER — Other Ambulatory Visit: Payer: Self-pay | Admitting: *Deleted

## 2018-05-24 NOTE — Patient Outreach (Signed)
[facility]) Care Management  05/24/2018  Brittany Douglas [birth year] [id number]   Called patient as schedule to complete initial assessment for both the patient and her husband. She states the [place] physical therapist just arrived to provide therapy to her husband and she requests the call be rescheduled for tomorrow morning.   Will plan to call patient int he morning per her request. Barrington Ellison RN,CCM,CDE [place] Management Coordinator Office Phone [phone] Office Fax [phone]

## 2018-05-25 ENCOUNTER — Other Ambulatory Visit: Payer: Self-pay | Admitting: *Deleted

## 2018-05-28 ENCOUNTER — Encounter: Payer: Self-pay | Admitting: *Deleted

## 2018-05-28 NOTE — Patient Outreach (Addendum)
[place] [facility]) Care Management  [place]  05/21/2018   Devanee Pomplun Douglas Aug 12, 1941 [id number]   Per her request, Brittany Douglas was contacted via phone to enroll her husband in the [employer] diabetes quality program and for assistance for herself in managing her blood pressure via the [place] [facility] health coach program.   Subjective: [name] says she would like assistance from a health coach to help her with managing her blood pressure but more importantly to help her with her husband's health issues. She says when he is sick, she worries and her blood pressure increases.    Objective: Not  applicable  Encounter Medications:  Outpatient Encounter Medications as of 05/21/2018  Medication Sig Note  . aspirin 81 MG tablet Take 81 mg by mouth daily. 05/21/2018: Takes at hs  . Bee Pollen 550 MG CAPS Take 2 capsules by mouth.   . carvedilol (COREG) 6.25 MG tablet Take 6.25 mg by mouth 2 (two) times daily with a meal.   . Cetirizine HCl (ZYRTEC PO) Take by mouth.   . Cholecalciferol (VITAMIN D PO) Take by mouth.   . conjugated estrogens (PREMARIN) vaginal cream INSERT APPLICATORFUL AS DIRECTED TWICE WEEKLY   . diazepam (VALIUM) 5 MG tablet Take 5 mg by mouth every 6 (six) hours as needed. 05/21/2018: Take 1/2 tablet and takes very rarely  . losartan (COZAAR) 100 MG tablet Take 100 mg by mouth daily.   . Multiple Vitamin (MULTIVITAMIN) tablet Take 1 tablet by mouth daily.   . Polyethylene Glycol 3350 (MIRALAX PO) Take by mouth. 05/21/2018: 1/2 cup daily for constipation related to IBS  . Ranitidine HCl (ZANTAC PO) Take by mouth. 05/21/2018: Also helps with itching  . rosuvastatin (CRESTOR) 5 MG tablet Take 5 mg by mouth daily. 05/21/2018: Takes at hs  . sertraline (ZOLOFT) 50 MG tablet Take 150 mg by mouth daily. 75 mg. 05/21/2018: Says she takes it for anxiety   No facility-administered encounter medications on file as of 05/21/2018.      Functional Status:  In your present state of health, do you have any difficulty performing the following activities: 05/21/2018  Hearing? Y  Vision? N  Difficulty concentrating or making decisions? N  Walking or climbing stairs? N  Dressing or bathing? N  Doing errands, shopping? N  Preparing Food and eating ? N  Using the Toilet? N  Managing your Medications? N  Managing your Finances? N  Housekeeping or managing your Housekeeping? N  Some recent data might be hidden    Fall/Depression Screening: Fall Risk  05/21/2018  Falls in the past year? No   No flowsheet data found.  Assessment: Enrolled Brittany Douglas in the health coach program for assistance with HTN management.   Plan:   [facility] CM Care Plan Problem One     Most Recent Value  Care Plan Problem One  Knowledge deficit related to chronic disease management of HTN   Role Documenting the Problem One  Care Management Coordinator  Care Plan for Problem One  Active  THN Long Term Goal   In the next 90 days patient will verbalized improved self monitored blood pressure readings  THN Long Term Goal Start Date  05/21/18  Interventions for Problem One Long Term Goal  ensured patient has home BP monitor, assessed home BP readings, discussed factors that contribute to increased blood pressure and strategies to decrease blood pressure, mailed Emmi information on "Controlling your Blood Pressure Through Lifestyle" and "Dash Diet"  THN CM Short Term Goal #1   in the next 30 to 60 days, patient will verbalize utilizing stress reduction strategies  THN CM Short Term Goal #1 Start Date  05/21/18  Interventions for Short Term Goal #1  discussed strategies to reduce stress, mailed Emmi information on "Tips to Help You Worry Less" and "Helping You Cope"      RNCM to fax today's note to Dr Stephanie Acre. RNCM will contact member by phone monthly and as needed to assist with HTN  self-management and assess member's progress toward mutually set goals per  [place] Management health coach program.   Barrington Ellison RN,CCM,CDE [place] Management Coordinator Office Phone [phone] Office Fax [phone]

## 2018-05-31 NOTE — Patient Outreach (Signed)
[facility]) Care Management  05/31/2018  Valary Manahan Kang [birth year] [id number]   See note of 6/10. 6/11 call to this patient cancelled. Barrington Ellison RN,CCM,CDE [place] Management Coordinator Office Phone [phone] Office Fax [phone]

## 2018-06-15 ENCOUNTER — Other Ambulatory Visit: Payer: Self-pay | Admitting: *Deleted

## 2018-06-15 DIAGNOSIS — M8588 Other specified disorders of bone density and structure, other site: Secondary | ICD-10-CM | POA: Diagnosis not present

## 2018-06-15 DIAGNOSIS — E2839 Other primary ovarian failure: Secondary | ICD-10-CM | POA: Diagnosis not present

## 2018-06-15 NOTE — Patient Outreach (Signed)
See other note of today. Barrington Ellison RN,CCM,CDE [place] Management Coordinator Office Phone [phone] Office Fax [phone]

## 2018-06-15 NOTE — Patient Outreach (Signed)
[place] [facility]) Care Management  06/15/2018  Brittany Douglas [birth year] [id number]   [name] for [place] Coach monthly telephonic follow up; she states she is leaving her house for bone scan and wishes to reschedule. She states she will call this RNCM to reschedule later this week.   Barrington Ellison RN,CCM,CDE [place] Management Coordinator Office Phone [phone] Office Fax [phone]

## 2018-07-08 ENCOUNTER — Other Ambulatory Visit: Payer: Self-pay | Admitting: Family Medicine

## 2018-07-08 ENCOUNTER — Other Ambulatory Visit: Payer: Self-pay | Admitting: *Deleted

## 2018-07-08 ENCOUNTER — Ambulatory Visit
Admission: RE | Admit: 2018-07-08 | Discharge: 2018-07-08 | Disposition: A | Discharge status: Home / Self Care | Payer: PPO | Source: Ambulatory Visit | Attending: Family Medicine | Admitting: Family Medicine

## 2018-07-08 DIAGNOSIS — M47817 Spondylosis without myelopathy or radiculopathy, lumbosacral region: Secondary | ICD-10-CM

## 2018-07-08 DIAGNOSIS — M5136 Other intervertebral disc degeneration, lumbar region: Secondary | ICD-10-CM | POA: Diagnosis not present

## 2018-07-08 NOTE — Patient Outreach (Signed)
[facility]) Care Management  07/08/2018  Brittany Douglas [birth year] [id number]   Spoke with Brittany Douglas to arrange follow up call to assess self management of chronic disease for both she and her husband Brittany Douglas.  Brittany Douglas is a member of the  Brittany Douglas and Brittany Douglas is followed for health coaching for her HTN. At her request, will call again on 07/09/18 for update as they are currently on their way to am appointment with Brittany Douglas for [place].  Brittany Ellison RN,CCM,CDE [place] Management Coordinator Office Phone [phone] Office Fax [phone]

## 2018-07-21 ENCOUNTER — Other Ambulatory Visit: Payer: Self-pay | Admitting: *Deleted

## 2018-07-22 NOTE — Patient Outreach (Signed)
[place] [facility]) Care Management  [place]  07/21/2018   Brittany Douglas Sep 15, 1941 [id number]   Subjective: Brittany Douglas says she doing OK. Had a fall at [employer] on 7/14 and scrapped the left side of her face and jaw but no serious injury. Reports her bone density showed osteoporosis so her primary care provider has referred her to Dr. Nelva Bush and she will see him on 8/21. She says she does not take calcium because it constipates her, but she does take Vit D consistently. In regards to managing her blood pressure, she says she has not been checking it very often but she knows it is elevated anytime she is worrried. 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 She says her goals is to get her home office organized as this is a source of stress.  She says she takes a 1/2 Valium when she feels very stressed and it helps.    Objective: Not  applicable  Encounter Medications:  Outpatient Encounter Medications as of 07/21/2018  Medication Sig Note  . aspirin 81 MG tablet Take 81 mg by mouth daily. 05/21/2018: Takes at hs  . Bee Pollen 550 MG CAPS Take 2 capsules by mouth.   . carvedilol (COREG) 6.25 MG tablet Take 6.25 mg by mouth 2 (two) times daily with a meal.   . Cetirizine HCl (ZYRTEC PO) Take by mouth.   . Cholecalciferol (VITAMIN D PO) Take by mouth.   . conjugated estrogens (PREMARIN) vaginal cream INSERT APPLICATORFUL AS DIRECTED TWICE WEEKLY   . diazepam (VALIUM) 5 MG tablet Take 5 mg by mouth every 6 (six) hours as needed. 05/21/2018: Take 1/2 tablet and takes very rarely  . losartan (COZAAR) 100 MG tablet Take 100 mg by mouth daily.   . Multiple Vitamin (MULTIVITAMIN) tablet Take 1 tablet by mouth daily.   . Polyethylene Glycol 3350 (MIRALAX PO) Take by mouth. 05/21/2018: 1/2 cup daily for constipation related to IBS  . rosuvastatin (CRESTOR) 5 MG tablet Take 5 mg by  mouth daily. 05/21/2018: Takes at hs  . sertraline (ZOLOFT) 50 MG tablet Take 150 mg by mouth daily. 75 mg. 05/21/2018: Says she takes it for anxiety  . Ranitidine HCl (ZANTAC PO) Take by mouth. 05/21/2018: Also helps with itching   No facility-administered encounter medications on file as of 07/21/2018.     Functional Status:  In your present state of health, do you have any difficulty performing the following activities: 05/21/2018  Hearing? Y  Vision? N  Difficulty concentrating or making decisions? N  Walking or climbing stairs? N  Dressing or bathing? N  Doing errands, shopping? N  Preparing Food and eating ? N  Using the Toilet? N  Managing your Medications? N  Managing your Finances? N  Housekeeping or managing your Housekeeping? N  Some recent data might be hidden    Fall/Depression Screening: Fall Risk  07/21/2018 05/21/2018  Falls in the past year? Yes No  Number falls in past yr: 1 -  Injury with Fall? Yes -  Comment states she tripped at the entry steps at [employer]  -  Follow up Falls prevention discussed -   PHQ 2/9 Scores 07/06/2018  PHQ - 2 Score 1    Assessment: Brittany Douglas in the health coach program for assistance with HTN and stress management.   Plan:   [facility] CM Care Plan Problem  One     Most Recent Value  Care Plan Problem One  Knowledge deficit related to chronic disease management of HTN and stress  Role Documenting the Problem One  Care Management Coordinator  Care Plan for Problem One  Active  THN Long Term Goal   In the next 90 days patient will verbalized improved self monitored blood pressure readings and one strategy to relieve stress  THN Long Term Goal Start Date  07/21/18  Interventions for Problem One Long Term Goal ensured patient has functioning home BP monitor, assessed home BP readings, reviewed factors that contribute to increased blood pressure and strategies to decrease blood pressure, reviewed strategies to reduce stress, encouraged Brittany Douglas to make  time for herself to do things she enjoys, reviewed date and time for next primary care provider appointment, arranged for follow up health coach call in October    RNCM will contact member by phone quarterly and as needed to assist with HTN and stress self-management and assess member's progress toward mutually set goals per [place] Management health coach program.   Barrington Ellison RN,CCM,CDE [place] Management Coordinator Office Phone [phone] Office Fax [phone]

## 2018-07-22 NOTE — Patient Outreach (Signed)
[facility]) Care Management  07/22/2018  Annalucia Laino Verdun Aug 02, 1941 [id number]   See other note of today for details of health coach follow up call.  Barrington Ellison RN,CCM,CDE [place] Management Coordinator Office Phone [phone] Office Fax [phone]

## 2018-08-04 DIAGNOSIS — M5136 Other intervertebral disc degeneration, lumbar region: Secondary | ICD-10-CM | POA: Diagnosis not present

## 2018-08-04 DIAGNOSIS — M25511 Pain in right shoulder: Secondary | ICD-10-CM | POA: Diagnosis not present

## 2018-08-04 DIAGNOSIS — M1611 Unilateral primary osteoarthritis, right hip: Secondary | ICD-10-CM | POA: Diagnosis not present

## 2018-08-04 DIAGNOSIS — M4316 Spondylolisthesis, lumbar region: Secondary | ICD-10-CM | POA: Diagnosis not present

## 2018-08-23 DIAGNOSIS — M4316 Spondylolisthesis, lumbar region: Secondary | ICD-10-CM | POA: Diagnosis not present

## 2018-08-26 DIAGNOSIS — M4316 Spondylolisthesis, lumbar region: Secondary | ICD-10-CM | POA: Diagnosis not present

## 2018-08-30 DIAGNOSIS — M4316 Spondylolisthesis, lumbar region: Secondary | ICD-10-CM | POA: Diagnosis not present

## 2018-08-31 DIAGNOSIS — H26493 Other secondary cataract, bilateral: Secondary | ICD-10-CM | POA: Diagnosis not present

## 2018-09-02 DIAGNOSIS — M4316 Spondylolisthesis, lumbar region: Secondary | ICD-10-CM | POA: Diagnosis not present

## 2018-09-03 DIAGNOSIS — M25551 Pain in right hip: Secondary | ICD-10-CM | POA: Diagnosis not present

## 2018-09-03 DIAGNOSIS — M25561 Pain in right knee: Secondary | ICD-10-CM | POA: Diagnosis not present

## 2018-09-03 DIAGNOSIS — M1611 Unilateral primary osteoarthritis, right hip: Secondary | ICD-10-CM | POA: Diagnosis not present

## 2018-09-03 DIAGNOSIS — M1711 Unilateral primary osteoarthritis, right knee: Secondary | ICD-10-CM | POA: Diagnosis not present

## 2018-09-06 DIAGNOSIS — M4316 Spondylolisthesis, lumbar region: Secondary | ICD-10-CM | POA: Diagnosis not present

## 2018-09-21 DIAGNOSIS — E7800 Pure hypercholesterolemia, unspecified: Secondary | ICD-10-CM | POA: Diagnosis not present

## 2018-09-21 DIAGNOSIS — Z79899 Other long term (current) drug therapy: Secondary | ICD-10-CM | POA: Diagnosis not present

## 2018-09-27 DIAGNOSIS — I700 Atherosclerosis of aorta: Secondary | ICD-10-CM | POA: Diagnosis not present

## 2018-09-27 DIAGNOSIS — N182 Chronic kidney disease, stage 2 (mild): Secondary | ICD-10-CM | POA: Diagnosis not present

## 2018-09-27 DIAGNOSIS — F339 Major depressive disorder, recurrent, unspecified: Secondary | ICD-10-CM | POA: Diagnosis not present

## 2018-09-27 DIAGNOSIS — E039 Hypothyroidism, unspecified: Secondary | ICD-10-CM | POA: Diagnosis not present

## 2018-09-27 DIAGNOSIS — I129 Hypertensive chronic kidney disease with stage 1 through stage 4 chronic kidney disease, or unspecified chronic kidney disease: Secondary | ICD-10-CM | POA: Diagnosis not present

## 2018-09-27 DIAGNOSIS — R2689 Other abnormalities of gait and mobility: Secondary | ICD-10-CM | POA: Diagnosis not present

## 2018-10-01 ENCOUNTER — Other Ambulatory Visit: Payer: Self-pay | Admitting: *Deleted

## 2018-10-01 NOTE — Patient Outreach (Addendum)
[place] [facility]) Care Management  10/01/2018  Brittany Douglas [birth year] [id number]   Met with Brittany Douglas during [place] and flu shot clinic at [employer] on 10/9 . Administered flu shot and Nykole provided updated on her and her husband Harvey's health status. She advised this RNCM that she and Clide Deutscher will be seeing their PCP later this month. Encouraged her to call this RNCM for questions or concerns regarding ongoing disease management for her HTN and anxiety.  Reviewed Brittany Douglas' office visit note of 10/14 with Dr. Stephanie Acre. MD is recommending Brittany Douglas consult with personal trainer at the [facility] where she and Clide Deutscher go for exercise, for a  balance exercise program. Secure e-mail to [facility] RN Vanita Ingles today requesting information. Received return e-mail  from [place] with he requested information. Sent secure email to [place] advising her to contact this RNCM if she is interested in receiving the information.  Barrington Ellison RN,CCM,CDE [place] Management Coordinator Office Phone [phone] Office Fax [phone]

## 2018-10-20 ENCOUNTER — Other Ambulatory Visit: Payer: Self-pay | Admitting: *Deleted

## 2018-10-20 NOTE — Patient Outreach (Signed)
[place]) Care Management  10/20/2018  [name] 1941-05-30 [id number]   Brittany Douglas is followed in the [place] program for self management assistance with HTN.  E-mail sent to [place] with request to contact this RNCM to arrange routine follow up telephone assessment for sometime in the next week. If no response in next 2-3 business days, will make telephone outreach.  Barrington Ellison RN,CCM,CDE [place] Management Coordinator Office Phone [phone] Office Fax [phone]

## 2018-10-26 ENCOUNTER — Other Ambulatory Visit: Payer: Self-pay | Admitting: *Deleted

## 2018-10-26 NOTE — Patient Outreach (Addendum)
[facility]) Care Management  10/26/2018  Brittany Douglas 1941/07/09 [id number]  Brittany Douglas is followed in the [place] program for self management assistance with HTN.  She had not responded to previous outreaches by e-mail on  outreach e-mail sent by this [facility] on 10/18 and 11/6. Spoke with Brittany Douglas today via mobile number, she states she did receive and read both prior emails from this RNCM. She says she and her husband Brittany Douglas are out with their grandson at a school event. Sangita states she will call this RNCM to schedule a follow up call after she has 2 wisdom teeth pulled on 10/28/18.  Await return call from [place].  Barrington Ellison RN,CCM,CDE [place] Management Coordinator Office Phone [phone] Office Fax [phone]  Barrington Ellison RN,CCM,CDE [place] Management Coordinator Office Phone [phone] Office Fax [phone]

## 2018-11-17 ENCOUNTER — Other Ambulatory Visit: Payer: Self-pay | Admitting: *Deleted

## 2018-11-17 NOTE — Patient Outreach (Signed)
[place] [facility]) Care Management  11/17/2018  [name] Mar 16, 1941 [id number]   Brittany Douglas is followed in [place] Coach program for self management assistance with HTN.  E-mail sent to [place] requesting return call or e-mail in order to schedule a routine telephone assessment follow up.  Barrington Ellison RN,CCM,CDE [place] Management Coordinator Office Phone [phone] Office Fax [phone]

## 2018-11-22 ENCOUNTER — Other Ambulatory Visit: Payer: Self-pay | Admitting: *Deleted

## 2018-11-22 NOTE — Patient Outreach (Signed)
[place] [facility]) Care Management  [place]  11/22/2018   Cyndel Griffey Cowden 10-07-1941 [id number]   Brittany Douglas is followed in [place] Coach program for self management assistance with HTN   Subjective: Spoke with Joaquim Lai at length to receive update on health status and self management skills related to HTN.  She states she is doing well. Says she had 3 wisdom teeth removed 3 weeks ago and recovered without problems. She says she has not been monitoring her blood pressure as closely as she normally does but when she does her readings average 130-140/50-70 but that her pressure was excellent when she has her wisdom teeth removed.  She says she and her husband continue to work at the [employer] twice weekly.  She says she continues to enjoy outings with her family and church group. She says her stress level is less because Clide Deutscher has been well.  She says she did fall again at about a month ago at [employer] but was not injured. She requested this RNCM resend the e-mail about the balance exercise program at the [facility].  She says she will see her primary care provider on 05/25/18 for her annual wellness visit.   Objective:  Encounter Medications:  Outpatient Encounter Medications as of 11/22/2018  Medication Sig Note  . aspirin 81 MG tablet Take 81 mg by mouth daily. 05/21/2018: Takes at hs  . Bee Pollen 550 MG CAPS Take 2 capsules by mouth.   . carvedilol (COREG) 6.25 MG tablet Take 6.25 mg by mouth 2 (two) times daily with a meal.   . Cetirizine HCl (ZYRTEC PO) Take by mouth.   . Cholecalciferol (VITAMIN D PO) Take by mouth.   . conjugated estrogens (PREMARIN) vaginal cream INSERT APPLICATORFUL AS DIRECTED TWICE WEEKLY   . diazepam (VALIUM) 5 MG tablet Take 5 mg by mouth every 6 (six) hours as needed. 05/21/2018: Take 1/2 tablet and takes very rarely  . losartan (COZAAR) 100 MG tablet Take 100 mg by mouth daily.   . Multiple  Vitamin (MULTIVITAMIN) tablet Take 1 tablet by mouth daily.   . Polyethylene Glycol 3350 (MIRALAX PO) Take by mouth. 05/21/2018: 1/2 cup daily for constipation related to IBS  . Ranitidine HCl (ZANTAC PO) Take by mouth. 05/21/2018: Also helps with itching  . rosuvastatin (CRESTOR) 5 MG tablet Take 5 mg by mouth daily. 05/21/2018: Takes at hs  . sertraline (ZOLOFT) 50 MG tablet Take 150 mg by mouth daily. 75 mg. 05/21/2018: Says she takes it for anxiety   No facility-administered encounter medications on file as of 11/22/2018.     Functional Status:  In your present state of health, do you have any difficulty performing the following activities: 11/22/2018 05/21/2018  Hearing? Tempie Donning  Vision? N N  Difficulty concentrating or making decisions? N N  Walking or climbing stairs? N N  Dressing or bathing? N N  Doing errands, shopping? N N  Preparing Food and eating ? N N  Using the Toilet? N N  In the past six months, have you accidently leaked urine? N -  Do you have problems with loss of bowel control? N -  Managing your Medications? N N  Managing your Finances? N N  Housekeeping or managing your Housekeeping? N N  Some recent data might be hidden    Fall/Depression Screening: Fall Risk  07/21/2018 05/21/2018  Falls in the past year? Yes No  Number falls in past yr: 1 -  Injury with Fall? Yes -  Comment states she tripped at the entry steps at [employer]  -  Follow up Falls prevention discussed -   PHQ 2/9 Scores 07/06/2018  PHQ - 2 Score 1    Assessment: Carlise is self managing her blood pressure well, lipid profile is normal and stress level is manageable.   Plan:  [facility] CM Care Plan Problem One     Most Recent Value  Care Plan Problem One  Knowledge deficit related to chronic disease management of HTN   Role Documenting the Problem One  Care Management Coordinator  Care Plan for Problem One  Active  THN Long Term Goal  In the next 90 days, patient will report average self monitored  blood  pressure readings as <140/<90  THN Long Term Goal Start Date 11/22/18  Interventions for Problem One Long Term Goal Reviewed common causes of HTN such as  missed medications, excessive salt intake, inactivity, stress, and pain.  Assessed frequency and values of self monitored blood pressure readings, congratulated Joaquim Lai on her consistent exercise program and excellent medication taking behavior, encouraged her to continue to do things she enjoys to decrease stress levels, re-sent the e-mail to [place] with the "Moving For Better  Balance" and the Provider Referral Exercise Program"  classes at the [place] [employer] and encouraged her to contact Vanita Ingles, the [employer] Nurse, reviewed upcoming appointment with her primary care provider in 2020.    Will fax today's note to Dr Stephanie Acre. Will plan routine health coach follow up phone call in approximately 3 months. (March 2020)   Barrington Ellison RN,CCM,CDE [place] Management Coordinator Office Phone [phone] Office Fax [phone]

## 2018-11-23 NOTE — Patient Outreach (Signed)
[facility]) Care Management  11/23/2018  Leaira Fullam Soliday July 19, 1941 [id number]   Please refer to the other note of today. Started this note by mistake.  Barrington Ellison RN,CCM,CDE [place] Management Coordinator Office Phone [phone] Office Fax [phone]

## 2018-11-23 NOTE — Patient Outreach (Signed)
[facility]) Care Management  11/22/2018  Jericho Cieslik Buczynski [birth year] [id number]   Please refer to the other note of today. This note was a duplicate start.   Barrington Ellison RN,CCM,CDE [place] Management Coordinator Office Phone [phone] Office Fax [phone]

## 2018-11-29 NOTE — Progress Notes (Signed)
Brittany Douglas has come in to have her BP checked and to discuss the Provider Referral Exercise Program that she would like to do with her husband.  She doesn't have any complaints other than being tired sometimes.  Will consult her PMD to make sure she can participate in the program.

## 2018-11-30 ENCOUNTER — Other Ambulatory Visit: Payer: Self-pay | Admitting: *Deleted

## 2018-11-30 DIAGNOSIS — I10 Essential (primary) hypertension: Secondary | ICD-10-CM

## 2018-11-30 NOTE — Patient Outreach (Signed)
[facility] Care Management  11/30/2018  Brittany Douglas 05/09/1941 [id number]   [name] [facility] program for self management assistance with HTN.  Subjective: Spoke with Brittany Douglas today via phone to advise this RNCM is transitioning to another job position and that she and her husband Brittany Douglas will be assigned another [facility] Care Management Health Coach. She voiced appreciation for the help this RNCM has provided. She also says , per this RNCM'S suggestion, she and Brittany Douglas met with Brittany Douglas, the Wellness RN at the [employer] for blood pressure checks and to enroll in the Provider Referral Exercise Program. Brittany Douglas says the classes will begin on 01/08/19 and they will go every Wednesdays and Fridays for 10 weeks.   Plan: Will make referral to another [facility] Care Management Health Coach for ongoing HTN self management assistance.    Brittany Douglas RN,CCM,CDE [facility] Care Management Coordinator Office Phone [phone] Office Fax [phone] 

## 2018-12-31 ENCOUNTER — Other Ambulatory Visit: Payer: Self-pay

## 2018-12-31 NOTE — Telephone Encounter (Signed)
This encounter was created in error - please disregard.

## 2018-12-31 NOTE — Patient Outreach (Signed)
[facility]) Care Management  12/31/2018   Brittany Douglas [birth year] [id number]  Subjective: Successful outreach to the patient for assessment.  HIPAA verified.  The patient states that she is doing fine. She denies and pain or falls.  Her blood pressure was 110/58. She states that she does not check her blood pressure every day but feels she should start.  Discussed signs and symptoms, of HTN and diet.  The patient states that she does not have any symptoms and her diet is good.  She states that when she has anxiety that is what raises her blood pressure.  She has medication diazepam  5 mg she can take if needed.  She does take her medications as prescribed.  She exercises with her husband and they have a class set up with Vanita Ingles at the [facility] on 01-12-19.   Current Medications:  Current Outpatient Medications  Medication Sig Dispense Refill  . aspirin 81 MG tablet Take 81 mg by mouth daily.    Raelyn Ensign Pollen 550 MG CAPS Take 2 capsules by mouth.    . carvedilol (COREG) 6.25 MG tablet Take 6.25 mg by mouth 2 (two) times daily with a meal.    . Cetirizine HCl (ZYRTEC PO) Take by mouth.    . Cholecalciferol (VITAMIN D PO) Take by mouth.    . conjugated estrogens (PREMARIN) vaginal cream INSERT APPLICATORFUL AS DIRECTED TWICE WEEKLY 30 g 0  . diazepam (VALIUM) 5 MG tablet Take 5 mg by mouth every 6 (six) hours as needed.    Marland Kitchen losartan (COZAAR) 100 MG tablet Take 100 mg by mouth daily.    . Multiple Vitamin (MULTIVITAMIN) tablet Take 1 tablet by mouth daily.    . Polyethylene Glycol 3350 (MIRALAX PO) Take by mouth.    . Ranitidine HCl (ZANTAC PO) Take by mouth.    . rosuvastatin (CRESTOR) 5 MG tablet Take 5 mg by mouth daily.    . sertraline (ZOLOFT) 50 MG tablet Take 150 mg by mouth daily. 75 mg.     No current facility-administered medications for this visit.     Functional Status:  In your present state of health, do you have any difficulty performing  the following activities: 11/22/2018 05/21/2018  Hearing? Tempie Donning  Vision? N N  Difficulty concentrating or making decisions? N N  Walking or climbing stairs? N N  Dressing or bathing? N N  Doing errands, shopping? N N  Preparing Food and eating ? N N  Using the Toilet? N N  In the past six months, have you accidently leaked urine? N -  Do you have problems with loss of bowel control? N -  Managing your Medications? N N  Managing your Finances? N N  Housekeeping or managing your Housekeeping? N N  Some recent data might be hidden    Fall/Depression Screening: Fall Risk  12/31/2018 07/21/2018 05/21/2018  Falls in the past year? 0 Yes No  Number falls in past yr: - 1 -  Injury with Fall? - Yes -  Comment - states she tripped at the entry steps at [employer]  -  Follow up - Falls prevention discussed -   PHQ 2/9 Scores 07/06/2018  PHQ - 2 Score 1    Assessment: Patient will continue to benefit from health coach outreach for disease management and support.  THN CM Care Plan Problem One     Most Recent Value  Care Plan Problem One  Knowledge deficit related to chronic  disease management of HTN and stress  Role Documenting the Problem One  Health Coach  Care Plan for Problem One  Active  THN Long Term Goal   In the 30 days the patient will vebalize tht she has continue to manage her stress level and HTN  THN Long Term Goal Start Date  12/31/18  Interventions for Problem One Long Term Goal  Discussed with the patient about diet and stress.  She will attend balance class with Vanita Ingles on 01-12-19.       Plan: RN Health Coach will contact patient in the month of February and patient agrees to next outreach.   Lazaro Arms RN, BSN, [place] Direct Dial:  [phone]  Fax: [phone]

## 2019-01-16 NOTE — Progress Notes (Signed)
[facility] Report   Patient Details  Name: ABRIA VANNOSTRAND MRN: [id number] Date of Birth: 1941-10-12 Age: 78 y.o. PCP: [name], MD  Vitals:   01/11/19 2032  BP: (!) 160/78  Pulse: (!) 54  Resp: 18  SpO2: 98%  Weight: 139 lb 9.6 oz (63.3 kg)     [employer] Eval - 01/11/19 2033      Referral    Referring Provider  [facility] CM    Reason for referral  Orthopedic;Hypertension;Inactivity    Program Start Date  01/11/19      Measurement   Waist Circumference  35 inches    Hip Circumference  40 inches    Body fat  41 percent      Information for Trainer   Goals  "Strength for legs and balance to keep from falling"    Current Exercise  recumbent bike on occasion    Orthopedic Concerns  RT knee pain, RT hip and low back pain, feet hurt when stands for long periods"    Pertinent Medical History  see chart    Current Barriers  Time management    Restrictions/Precautions  Fall risk      Mobility and Daily Activities   I find it easy to walk up or down two or more flights of stairs.  1    I have no trouble taking out the trash.  2    I do housework such as vacuuming and dusting on my own without difficulty.  2    I can easily lift a gallon of milk (8lbs).  3    I can easily walk a mile.  2    I have no trouble reaching into high cupboards or reaching down to pick up something from the floor.  3    I do not have trouble doing out-door work such as Armed forces logistics/support/administrative officer, raking leaves, or gardening.  2      Mobility and Daily Activities   I feel younger than my age.  2    I feel independent.  3    I feel energetic.  1    I live an active life.   4    I feel strong.  3    I feel healthy.  3    I feel active as other people my age.  3      How fit and strong are you.   Fit and Strong Total Score  34      Past Medical History:  Diagnosis Date  . Anxiety   . Elevated cholesterol   . Hypertension   . IBS (irritable bowel syndrome)   . Osteopenia   .  Reflux    Past Surgical History:  Procedure Laterality Date  . BREAST SURGERY     Benign breast lumps  . CATARACT EXTRACTION    . CHOLECYSTECTOMY    . FOOT SURGERY    . VAGINAL HYSTERECTOMY     A & P repair   Social History   Tobacco Use  Smoking Status Never Smoker  Smokeless Tobacco Never Used    Ms. Custodio has registered and begun her 12-week, twice weekly PREP with her husband.  In addition to the 2 classes per week, they have access to any of the [facility] to exercise on their own.    Vanita Ingles 01/16/2019, 8:40 PM

## 2019-01-19 NOTE — Progress Notes (Signed)
[facility] Report   Patient Details  Name: MALVA DIESING MRN: [id number] Date of Birth: 17-Sep-1941 Age: 78 y.o. PCP: [name], MD  Vitals:   01/19/19 2021  Weight: 139 lb 9.6 oz (63.3 kg)      Past Medical History:  Diagnosis Date  . Anxiety   . Elevated cholesterol   . Hypertension   . IBS (irritable bowel syndrome)   . Osteopenia   . Reflux    Past Surgical History:  Procedure Laterality Date  . BREAST SURGERY     Benign breast lumps  . CATARACT EXTRACTION    . CHOLECYSTECTOMY    . FOOT SURGERY    . VAGINAL HYSTERECTOMY     A & P repair   Social History   Tobacco Use  Smoking Status Never Smoker  Smokeless Tobacco Never Used   Fun things you did since last meeting:"Brother[employer] Bday lunch w/church group at [facility]" Things you are grateful for:"God's provision & love & good all the time" Barriers:"getting out of car"   Vanita Ingles 01/19/2019, 8:21 PM

## 2019-01-26 NOTE — Progress Notes (Signed)
[facility] [employer] PREP Weekly Session   Patient Details  Name: Brittany Douglas MRN: [id number] Date of Birth: November 07, 1941 Age: 78 y.o. PCP: [name], MD  Vitals:   01/26/19 1657  Weight: 139 lb 9.6 oz (63.3 kg)    [employer] Weekly seesion - 01/26/19 1600      Weekly Session   Topic Discussed  Health habits    Minutes exercised this week  300 minutes   20x5cardio/20x5strength/20x42flexibility     Fun things you did since last meeting;"senior valentine banquet at church"   Vanita Ingles 01/26/2019, 5:00 PM

## 2019-01-31 ENCOUNTER — Other Ambulatory Visit: Payer: Self-pay

## 2019-01-31 NOTE — Patient Outreach (Signed)
[facility]) Care Management  01/31/2019  Karlie Aung [place] 1941-02-07 [id number]    1st attempt to outreach the patient.   Patient answered the phone.    She states that her and her husband were on their way to a dentist appointment and now is not a good time.  She asked if I could call them back at a later time.   Plan: RN Health Coach will make outreach attempt the patient within thirty business days.   Lazaro Arms RN, BSN, [place] Direct Dial:  [phone]  Fax: [phone]

## 2019-02-01 ENCOUNTER — Other Ambulatory Visit: Payer: Self-pay | Admitting: Gynecology

## 2019-02-02 NOTE — Progress Notes (Signed)
[facility] [employer] PREP Weekly Session   Patient Details  Name: Brittany Douglas MRN: [id number] Date of Birth: [birth year] Age: 78 y.o. PCP: [name], MD  Vitals:   02/01/19 2011  Weight: 139 lb (63 kg)    [facility] Weekly seesion - 02/02/19 2000      Weekly Session   Topic Discussed  Health habits    Minutes exercised this week  100 minutes   cardio     Things you are grateful for:"many blessings" Barriers:"Right knee"  Vanita Ingles 02/02/2019, 8:12 PM

## 2019-02-04 ENCOUNTER — Other Ambulatory Visit: Payer: Self-pay

## 2019-02-04 NOTE — Patient Outreach (Signed)
[facility]) Care Management  02/04/2019   Jaeden Westbay Matsumoto 1941/04/19 [id number]  Subjective: Successful outreach to the patient.  HIPAA verified.  Spoke with the husband and wife on the phone using the speaker. The wife states that she has been under a little stress due to her sister and husband have been sick and in the hospital and she does not live close. Discussed with her some options of trying to relax.  She verbalized understanding.  She states that she does not check her blood pressure daily but Wednesday when she went to her exercise class with her husband they checked it there and it was 138/70.  She states that she has continued to monitor her diet.  She has felt some discomfort in her right knee and leg and feels that it come from arthritis and the weather.  She denies any falls. She is taking her medications but is currently not on any thing for her heartburn.  She stopped zantac and the Prilosec makes her constipated.  RN Health Coach gave her other options to talk with her physician about Aciphex, and Protonix.  She verbalized understanding.    Current Medications:  Current Outpatient Medications  Medication Sig Dispense Refill  . aspirin 81 MG tablet Take 81 mg by mouth daily.    Raelyn Ensign Pollen 550 MG CAPS Take 2 capsules by mouth.    . carvedilol (COREG) 6.25 MG tablet Take 6.25 mg by mouth 2 (two) times daily with a meal.    . Cetirizine HCl (ZYRTEC PO) Take by mouth.    . Cholecalciferol (VITAMIN D PO) Take by mouth.    . conjugated estrogens (PREMARIN) vaginal cream INSERT APPLICATORFUL VAGINALLY 2 TIMES A WEEK AS DIRECTED 30 g 2  . diazepam (VALIUM) 5 MG tablet Take 5 mg by mouth every 6 (six) hours as needed.    Marland Kitchen losartan (COZAAR) 100 MG tablet Take 100 mg by mouth daily.    . Multiple Vitamin (MULTIVITAMIN) tablet Take 1 tablet by mouth daily.    . Polyethylene Glycol 3350 (MIRALAX PO) Take by mouth.    . rosuvastatin (CRESTOR) 5 MG tablet Take 5  mg by mouth daily.    . sertraline (ZOLOFT) 50 MG tablet Take 150 mg by mouth daily. 75 mg.    . Ranitidine HCl (ZANTAC PO) Take by mouth.     No current facility-administered medications for this visit.     Functional Status:  In your present state of health, do you have any difficulty performing the following activities: 11/22/2018 05/21/2018  Hearing? Tempie Donning  Vision? N N  Difficulty concentrating or making decisions? N N  Walking or climbing stairs? N N  Dressing or bathing? N N  Doing errands, shopping? N N  Preparing Food and eating ? N N  Using the Toilet? N N  In the past six months, have you accidently leaked urine? N -  Do you have problems with loss of bowel control? N -  Managing your Medications? N N  Managing your Finances? N N  Housekeeping or managing your Housekeeping? N N  Some recent data might be hidden    Fall/Depression Screening: Fall Risk  02/04/2019 02/04/2019 12/31/2018  Falls in the past year? 0 0 0  Number falls in past yr: - - -  Injury with Fall? - - -  Comment - - -  Follow up - - -   PHQ 2/9 Scores 07/06/2018  PHQ - 2 Score 1  Assessment: Patient will continue to benefit from health coach outreach for disease management and support. THN CM Care Plan Problem One     Most Recent Value  THN Long Term Goal   In the 90 days the patient will vebalize tht she has continue to manage her stress level and HTN  THN Long Term Goal Start Date  02/04/19  Interventions for Problem One Long Term Goal  Talked with the aptient about her stress level and relaxation options, exercise and medications adherence.     Plan: RN Health Coach will contact patient in the month of May and patient agrees to next outreach.   Lazaro Arms RN, BSN, [place] Direct Dial:  [phone]  Fax: [phone]

## 2019-02-14 DIAGNOSIS — H26492 Other secondary cataract, left eye: Secondary | ICD-10-CM | POA: Diagnosis not present

## 2019-02-16 ENCOUNTER — Other Ambulatory Visit: Payer: Self-pay | Admitting: Family Medicine

## 2019-02-16 DIAGNOSIS — Z1231 Encounter for screening mammogram for malignant neoplasm of breast: Secondary | ICD-10-CM

## 2019-02-25 DIAGNOSIS — E039 Hypothyroidism, unspecified: Secondary | ICD-10-CM | POA: Diagnosis not present

## 2019-03-22 ENCOUNTER — Ambulatory Visit: Payer: PPO

## 2019-03-24 ENCOUNTER — Ambulatory Visit: Payer: PPO

## 2019-05-03 ENCOUNTER — Encounter: Payer: PPO | Admitting: Gynecology

## 2019-05-03 ENCOUNTER — Ambulatory Visit: Payer: PPO

## 2019-05-05 ENCOUNTER — Encounter: Payer: PPO | Admitting: Gynecology

## 2019-05-06 ENCOUNTER — Other Ambulatory Visit: Payer: Self-pay

## 2019-05-06 NOTE — Patient Outreach (Signed)
[facility]) Care Management  05/06/2019   Brittany Douglas 1941/03/23 [id number]  Subjective: Successful outreach.  Two patient identifiers given by the patient.  The patient had the speaker phone on and Brittany Douglas was present in the room.  She states that she has been doing well staying in the home.  She has a neighbor that helps get groceries and medications for them.  She states that she has not had any symptoms of HTN.  One time "a few days back" she know that her blood pressure was up but she kept forgetting to check her pressure. She feels it was due to stress.  She states that being in the home and not going out has caused her  stress. Advised the patient to go out the home and walk or just sit and get some fresh air just for a change of scenery.  She verbalized understanding.  She states that she take her medications as prescribed.  She states that she no longer takes medication for reflux because it does not agree with her.  She states that she has started a probiotic and it helps her stomach.  She has an appointment for follow up on June 10th along with her husband.  Current Medications:  Current Outpatient Medications  Medication Sig Dispense Refill  . aspirin 81 MG tablet Take 81 mg by mouth daily.    Brittany Douglas 550 MG CAPS Take 2 capsules by mouth.    . carvedilol (COREG) 6.25 MG tablet Take 6.25 mg by mouth 2 (two) times daily with a meal.    . Cetirizine HCl (ZYRTEC PO) Take by mouth.    . Cholecalciferol (VITAMIN D PO) Take by mouth.    . conjugated estrogens (PREMARIN) vaginal cream INSERT APPLICATORFUL VAGINALLY 2 TIMES A WEEK AS DIRECTED 30 g 2  . diazepam (VALIUM) 5 MG tablet Take 5 mg by mouth every 6 (six) hours as needed.    Marland Kitchen losartan (COZAAR) 100 MG tablet Take 100 mg by mouth daily.    . Multiple Vitamin (MULTIVITAMIN) tablet Take 1 tablet by mouth daily.    . Polyethylene Glycol 3350 (MIRALAX PO) Take by mouth.    . Probiotic Product  (PROBIOTIC-10) CHEW Chew by mouth.    . rosuvastatin (CRESTOR) 5 MG tablet Take 5 mg by mouth daily.    . sertraline (ZOLOFT) 50 MG tablet Take 150 mg by mouth daily. 75 mg.    . Ranitidine HCl (ZANTAC PO) Take by mouth.     No current facility-administered medications for this visit.     Functional Status:  In your present state of health, do you have any difficulty performing the following activities: 11/22/2018 05/21/2018  Hearing? Brittany Douglas  Vision? N N  Difficulty concentrating or making decisions? N N  Walking or climbing stairs? N N  Dressing or bathing? N N  Doing errands, shopping? N N  Preparing Food and eating ? N N  Using the Toilet? N N  In the past six months, have you accidently leaked urine? N -  Do you have problems with loss of bowel control? N -  Managing your Medications? N N  Managing your Finances? N N  Housekeeping or managing your Housekeeping? N N  Some recent data might be hidden    Fall/Depression Screening: Fall Risk  05/06/2019 02/04/2019 02/04/2019  Falls in the past year? 0 0 0  Number falls in past yr: - - -  Injury with Fall? - - -  Comment - - -  Follow up - - -   PHQ 2/9 Scores 07/06/2018  PHQ - 2 Score 1    Assessment: Patient will continue to benefit from health coach outreach for disease management and support. THN CM Care Plan Problem One     Most Recent Value  THN Long Term Goal   In the 90 days the patient will vebalize tht she has continue to manage her stress level and HTN  THN Long Term Goal Start Date  05/06/19  Interventions for Problem One Long Term Goal  Discusses signs and symptoms of HTN,  reviewed measures to relieve stress, exercise and adherence to medications.     Plan: RN Health Coach will contact patient in the month of August and patient agrees to next outreach.   Brittany Arms RN, BSN, [place] Direct Dial:  [phone]  Fax: [phone]

## 2019-05-17 DIAGNOSIS — E559 Vitamin D deficiency, unspecified: Secondary | ICD-10-CM | POA: Diagnosis not present

## 2019-05-17 DIAGNOSIS — Z79899 Other long term (current) drug therapy: Secondary | ICD-10-CM | POA: Diagnosis not present

## 2019-05-17 DIAGNOSIS — E039 Hypothyroidism, unspecified: Secondary | ICD-10-CM | POA: Diagnosis not present

## 2019-05-17 DIAGNOSIS — R001 Bradycardia, unspecified: Secondary | ICD-10-CM | POA: Diagnosis not present

## 2019-05-17 DIAGNOSIS — L659 Nonscarring hair loss, unspecified: Secondary | ICD-10-CM | POA: Diagnosis not present

## 2019-05-26 DIAGNOSIS — K589 Irritable bowel syndrome without diarrhea: Secondary | ICD-10-CM | POA: Diagnosis not present

## 2019-05-26 DIAGNOSIS — Z0000 Encounter for general adult medical examination without abnormal findings: Secondary | ICD-10-CM | POA: Diagnosis not present

## 2019-05-26 DIAGNOSIS — N182 Chronic kidney disease, stage 2 (mild): Secondary | ICD-10-CM | POA: Diagnosis not present

## 2019-05-26 DIAGNOSIS — I10 Essential (primary) hypertension: Secondary | ICD-10-CM | POA: Diagnosis not present

## 2019-05-26 DIAGNOSIS — E559 Vitamin D deficiency, unspecified: Secondary | ICD-10-CM | POA: Diagnosis not present

## 2019-05-26 DIAGNOSIS — M5137 Other intervertebral disc degeneration, lumbosacral region: Secondary | ICD-10-CM | POA: Diagnosis not present

## 2019-05-26 DIAGNOSIS — E7800 Pure hypercholesterolemia, unspecified: Secondary | ICD-10-CM | POA: Diagnosis not present

## 2019-05-26 DIAGNOSIS — Z131 Encounter for screening for diabetes mellitus: Secondary | ICD-10-CM | POA: Diagnosis not present

## 2019-06-06 ENCOUNTER — Other Ambulatory Visit: Payer: Self-pay

## 2019-06-06 NOTE — Patient Outreach (Signed)
[facility]) Care Management  06/06/2019  Karena Kinker Mennella 04-16-1941 [id number]    RN Health Coach closing the program.  Patient is transitioning to external program [facility] for continued case management.  Lazaro Arms RN, BSN, [place] Direct Dial:  [phone]  Fax: [phone]

## 2019-06-13 ENCOUNTER — Ambulatory Visit: Payer: PPO

## 2019-06-27 ENCOUNTER — Other Ambulatory Visit: Payer: Self-pay

## 2019-06-28 ENCOUNTER — Encounter: Payer: Self-pay | Admitting: Gynecology

## 2019-06-28 ENCOUNTER — Ambulatory Visit (INDEPENDENT_AMBULATORY_CARE_PROVIDER_SITE_OTHER): Payer: PPO | Admitting: Gynecology

## 2019-06-28 VITALS — BP 124/70 | Ht 62.0 in | Wt 132.0 lb

## 2019-06-28 DIAGNOSIS — N3000 Acute cystitis without hematuria: Secondary | ICD-10-CM | POA: Diagnosis not present

## 2019-06-28 DIAGNOSIS — Z01419 Encounter for gynecological examination (general) (routine) without abnormal findings: Secondary | ICD-10-CM

## 2019-06-28 DIAGNOSIS — N952 Postmenopausal atrophic vaginitis: Secondary | ICD-10-CM

## 2019-06-28 DIAGNOSIS — M8580 Other specified disorders of bone density and structure, unspecified site: Secondary | ICD-10-CM | POA: Diagnosis not present

## 2019-06-28 LAB — URINALYSIS
Bilirubin Urine: NEGATIVE
Glucose, UA: NEGATIVE
Hgb urine dipstick: NEGATIVE
Ketones, ur: NEGATIVE
Nitrite: NEGATIVE
Protein, ur: NEGATIVE
Specific Gravity, Urine: 1.015 (ref 1.001–1.03)
pH: 7.5 (ref 5.0–8.0)

## 2019-06-28 MED ORDER — NITROFURANTOIN MONOHYD MACRO 100 MG PO CAPS: 100.0000 mg | ORAL_CAPSULE | Freq: Two times a day (BID) | ORAL | 0 refills | Status: DC

## 2019-06-28 NOTE — Addendum Note (Signed)
Addended by: Anastasio Auerbach on: 06/28/2019 04:07 PM   Modules accepted: Orders

## 2019-06-28 NOTE — Progress Notes (Signed)
    Brittany Douglas 07/10/1941 [id number]        78 y.o.  [id number] for breast and pelvic exam.  Without gynecologic complaints.  Past medical history,surgical history, problem list, medications, allergies, family history and social history were all reviewed and documented as reviewed in the EPIC chart.  ROS:  Performed with pertinent positives and negatives included in the history, assessment and plan.   Additional significant findings : None   Exam: [name] assistant Vitals:   06/28/19 1429  BP: 124/70  Weight: 132 lb (59.9 kg)  Height: 5\' 2"  (1.575 m)   Body mass index is 24.14 kg/m.  General appearance:  Normal affect, orientation and appearance. Skin: Grossly normal HEENT: Without gross lesions.  No cervical or supraclavicular adenopathy. Thyroid normal.  Lungs:  Clear without wheezing, rales or rhonchi Cardiac: RR, without RMG Abdominal:  Soft, nontender, without masses, guarding, rebound, organomegaly or hernia Breasts:  Examined lying and sitting without masses, retractions, discharge or axillary adenopathy. Pelvic:  Ext, BUS, Vagina: With atrophic changes  Adnexa: Without masses or tenderness    Anus and perineum: Normal   Rectovaginal: Normal sphincter tone without palpated masses or tenderness.    Assessment/Plan:  78 y.o. [id number] female for breast and pelvic exam.  Status post TVH A&P repair.  1. Postmenopausal.  Uses Premarin vaginal cream twice weekly.  Has done well with this and wants to continue.  Has supply but will call when needs more. 2. Occasional suprapubic pressure.  No frequency dysuria urgency low back pain fever or chills.  Urine analysis today is consistent with UTI showing moderate bacteria and 6-10 WBC.  Patient asked me not to run a culture because of cost.  She understands the issues of resistance.  Macrobid 100 mg twice daily x7 days prescribed.  Follow-up if her symptoms persist. 3. Has mammogram scheduled in 2 weeks.  We will  follow-up for this.  Breast exam normal today. 4. Osteopenia.  Reports DEXA 2019 5. Colonoscopy 2015.  Repeat at their recommended interval. 6. Pap smear 2011.  No Pap smear done today.  No history of significant abnormal Pap smears.  We both agree to stop screening per current screening guidelines. 7. Health maintenance.  No routine lab work done as patient does this elsewhere.  Follow-up 1 year, sooner as needed.   Anastasio Auerbach MD, 3:00 PM 06/28/2019

## 2019-06-28 NOTE — Patient Instructions (Addendum)
Take the prescribed antibiotic twice daily for 7 days.  Follow-up in 1 year for annual exam, sooner as needed.

## 2019-07-12 ENCOUNTER — Other Ambulatory Visit: Payer: Self-pay

## 2019-07-12 ENCOUNTER — Ambulatory Visit
Admission: RE | Admit: 2019-07-12 | Discharge: 2019-07-12 | Disposition: A | Discharge status: Home / Self Care | Payer: PPO | Source: Ambulatory Visit | Attending: Family Medicine | Admitting: Family Medicine

## 2019-07-12 DIAGNOSIS — Z1231 Encounter for screening mammogram for malignant neoplasm of breast: Secondary | ICD-10-CM | POA: Diagnosis not present

## 2019-07-18 DIAGNOSIS — F419 Anxiety disorder, unspecified: Secondary | ICD-10-CM | POA: Diagnosis not present

## 2019-07-18 DIAGNOSIS — K219 Gastro-esophageal reflux disease without esophagitis: Secondary | ICD-10-CM | POA: Diagnosis not present

## 2019-07-18 DIAGNOSIS — Z1211 Encounter for screening for malignant neoplasm of colon: Secondary | ICD-10-CM | POA: Diagnosis not present

## 2019-07-18 DIAGNOSIS — K589 Irritable bowel syndrome without diarrhea: Secondary | ICD-10-CM | POA: Diagnosis not present

## 2019-07-20 DIAGNOSIS — L719 Rosacea, unspecified: Secondary | ICD-10-CM | POA: Diagnosis not present

## 2019-07-20 DIAGNOSIS — L570 Actinic keratosis: Secondary | ICD-10-CM | POA: Diagnosis not present

## 2019-07-20 DIAGNOSIS — L820 Inflamed seborrheic keratosis: Secondary | ICD-10-CM | POA: Diagnosis not present

## 2019-07-20 DIAGNOSIS — L821 Other seborrheic keratosis: Secondary | ICD-10-CM | POA: Diagnosis not present

## 2019-07-20 DIAGNOSIS — D225 Melanocytic nevi of trunk: Secondary | ICD-10-CM | POA: Diagnosis not present

## 2019-07-26 ENCOUNTER — Encounter (HOSPITAL_BASED_OUTPATIENT_CLINIC_OR_DEPARTMENT_OTHER): Payer: Self-pay | Admitting: *Deleted

## 2019-07-26 ENCOUNTER — Emergency Department (HOSPITAL_BASED_OUTPATIENT_CLINIC_OR_DEPARTMENT_OTHER)
Admission: EM | Admit: 2019-07-26 | Discharge: 2019-07-27 | Disposition: A | Discharge status: Home / Self Care | Payer: PPO | Attending: Emergency Medicine | Admitting: Emergency Medicine

## 2019-07-26 ENCOUNTER — Other Ambulatory Visit: Payer: Self-pay

## 2019-07-26 ENCOUNTER — Emergency Department (HOSPITAL_BASED_OUTPATIENT_CLINIC_OR_DEPARTMENT_OTHER): Payer: PPO

## 2019-07-26 DIAGNOSIS — R0789 Other chest pain: Secondary | ICD-10-CM

## 2019-07-26 DIAGNOSIS — E785 Hyperlipidemia, unspecified: Secondary | ICD-10-CM | POA: Insufficient documentation

## 2019-07-26 DIAGNOSIS — R079 Chest pain, unspecified: Secondary | ICD-10-CM | POA: Insufficient documentation

## 2019-07-26 DIAGNOSIS — Z79899 Other long term (current) drug therapy: Secondary | ICD-10-CM | POA: Insufficient documentation

## 2019-07-26 DIAGNOSIS — I10 Essential (primary) hypertension: Secondary | ICD-10-CM | POA: Insufficient documentation

## 2019-07-26 DIAGNOSIS — Z7982 Long term (current) use of aspirin: Secondary | ICD-10-CM | POA: Insufficient documentation

## 2019-07-26 LAB — URINALYSIS, ROUTINE W REFLEX MICROSCOPIC
Bilirubin Urine: NEGATIVE
Glucose, UA: NEGATIVE mg/dL
Hgb urine dipstick: NEGATIVE
Ketones, ur: NEGATIVE mg/dL
Leukocytes,Ua: NEGATIVE
Nitrite: NEGATIVE
Protein, ur: NEGATIVE mg/dL
Specific Gravity, Urine: 1.01 (ref 1.005–1.030)
pH: 7.5 (ref 5.0–8.0)

## 2019-07-26 LAB — TROPONIN I (HIGH SENSITIVITY)
Troponin I (High Sensitivity): 6 ng/L (ref <18)
Troponin I (High Sensitivity): 6 ng/L (ref <18)

## 2019-07-26 LAB — CBC
HCT: 40.1 % (ref 36.0–46.0)
Hemoglobin: 13.6 g/dL (ref 12.0–15.0)
MCH: 33 pg (ref 26.0–34.0)
MCHC: 33.9 g/dL (ref 30.0–36.0)
MCV: 97.3 fL (ref 80.0–100.0)
Platelets: 192 10*3/uL (ref 150–400)
RBC: 4.12 MIL/uL (ref 3.87–5.11)
RDW: 12.5 % (ref 11.5–15.5)
WBC: 5.9 10*3/uL (ref 4.0–10.5)
nRBC: 0 % (ref 0.0–0.2)

## 2019-07-26 LAB — BASIC METABOLIC PANEL WITH GFR
Anion gap: 11 (ref 5–15)
BUN: 18 mg/dL (ref 8–23)
CO2: 27 mmol/L (ref 22–32)
Calcium: 9.3 mg/dL (ref 8.9–10.3)
Chloride: 97 mmol/L — ABNORMAL LOW (ref 98–111)
Creatinine, Ser: 0.63 mg/dL (ref 0.44–1.00)
GFR calc Af Amer: >60 mL/min
GFR calc non Af Amer: >60 mL/min
Glucose, Bld: 114 mg/dL — ABNORMAL HIGH (ref 70–99)
Potassium: 4.2 mmol/L (ref 3.5–5.1)
Sodium: 135 mmol/L (ref 135–145)

## 2019-07-26 MED ORDER — SODIUM CHLORIDE 0.9% FLUSH
3.0000 mL | Freq: Once | INTRAVENOUS | Status: DC
  Filled 2019-07-26: qty 3

## 2019-07-26 NOTE — ED Notes (Signed)
Urine sample collected and placed in lab.

## 2019-07-26 NOTE — ED Triage Notes (Signed)
Chest pain, dizziness, HTN and headache tonight. States she took a Xanax around 3 hours ago with no relief.

## 2019-07-26 NOTE — ED Provider Notes (Signed)
TIME SEEN: 11:30 PM  CHIEF COMPLAINT: Hypertension  HPI: Patient is a 78 year old female with history of hypertension, hyperlipidemia, anxiety who presents to the emergency department with concerns for hypertension.  Patient reports she is on losartan 100 mg daily and carvedilol 6.125 mg twice daily.  States she checked her blood pressure tonight because "I am supposed to check it".  She states it was in the 220s/90s.  Reports her blood pressure normally runs in the 696V to 893Y systolic.  She had taken her losartan this morning but had not taken her second dose of carvedilol.  Took second dose of carvedilol and 5 mg of Valium for anxiety prior to arrival.  Did have some left-sided chest pressure but states that she has had this intermittently with stress for over 10 years.  No associated shortness of breath, nausea, vomiting, diaphoresis.  Did have mild headache that has completely resolved.  No vision changes.  No numbness, focal weakness.  Did have some tingling in both hands and both feet which also happens when she has anxiety.  At this is now resolved.  ROS: See HPI Constitutional: no fever  Eyes: no drainage  ENT: no runny nose   Cardiovascular:  no chest pain  Resp: no SOB  GI: no vomiting GU: no dysuria Integumentary: no rash  Allergy: no hives  Musculoskeletal: no leg swelling  Neurological: no slurred speech ROS otherwise negative  PAST MEDICAL HISTORY/PAST SURGICAL HISTORY:  Past Medical History:  Diagnosis Date  . Anxiety   . Elevated cholesterol   . Hypertension   . IBS (irritable bowel syndrome)   . Osteopenia   . Reflux     MEDICATIONS:  Prior to Admission medications   Medication Sig Start Date End Date Taking? Authorizing Provider  aspirin 81 MG tablet Take 81 mg by mouth daily.    [provider]  Bee Pollen 550 MG CAPS Take 2 capsules by mouth.    [provider]  carvedilol (COREG) 6.25 MG tablet Take 6.25 mg by mouth 2 (two) times daily with  a meal.    [provider]  Cetirizine HCl (ZYRTEC PO) Take by mouth.    [provider]  Cholecalciferol (VITAMIN D PO) Take by mouth.    [provider]  conjugated estrogens (PREMARIN) vaginal cream INSERT APPLICATORFUL VAGINALLY 2 TIMES A WEEK AS DIRECTED 02/01/19   Fontaine, Belinda Block, MD  diazepam (VALIUM) 5 MG tablet Take 5 mg by mouth every 6 (six) hours as needed.    [provider]  losartan (COZAAR) 100 MG tablet Take 100 mg by mouth daily.    [provider]  Multiple Vitamin (MULTIVITAMIN) tablet Take 1 tablet by mouth daily.    [provider]  nitrofurantoin, macrocrystal-monohydrate, (MACROBID) 100 MG capsule Take 1 capsule (100 mg total) by mouth 2 (two) times daily. 06/28/19   Fontaine, Belinda Block, MD  Polyethylene Glycol 3350 (MIRALAX PO) Take by mouth.    [provider]  Probiotic Product (PROBIOTIC-10) CHEW Chew by mouth.    [provider]  rosuvastatin (CRESTOR) 5 MG tablet Take 5 mg by mouth daily.    [provider]  sertraline (ZOLOFT) 50 MG tablet Take 150 mg by mouth daily. 75 mg.    [provider]    ALLERGIES:  Allergies  Allergen Reactions  . Ace Inhibitors   . Amlodipine   . Codeine Nausea And Vomiting  . Cymbalta [Duloxetine Hcl]   . Doxycycline   . Hctz [  Hydrochlorothiazide]   . Penicillins Hives  . Sulfa Antibiotics Rash    SOCIAL HISTORY:  Social History   Tobacco Use  . Smoking status: Never Smoker  . Smokeless tobacco: Never Used  Substance Use Topics  . Alcohol use: No    Alcohol/week: 0.0 standard drinks    FAMILY HISTORY: Family History  Problem Relation Age of Onset  . Stroke Mother   . Hypertension Mother   . Uterine cancer Mother   . Stroke Father   . Heart attack Brother   . Stroke Brother   . Diabetes Maternal Grandmother   . Heart attack Paternal Grandmother   . Breast cancer Maternal Aunt     EXAM: BP (!) 179/89   Pulse 63    Temp 98.4 F (36.9 C) (Oral)   Resp 18   SpO2 97%  CONSTITUTIONAL: Alert and oriented and responds appropriately to questions. Well-appearing; well-nourished, elderly, no distress HEAD: Normocephalic EYES: Conjunctivae clear, pupils appear equal, EOMI ENT: normal nose; moist mucous membranes NECK: Supple, no meningismus, no nuchal rigidity, no LAD  CARD: RRR; S1 and S2 appreciated; no murmurs, no clicks, no rubs, no gallops RESP: Normal chest excursion without splinting or tachypnea; breath sounds clear and equal bilaterally; no wheezes, no rhonchi, no rales, no hypoxia or respiratory distress, speaking full sentences ABD/GI: Normal bowel sounds; non-distended; soft, non-tender, no rebound, no guarding, no peritoneal signs, no hepatosplenomegaly BACK:  The back appears normal and is non-tender to palpation, there is no CVA tenderness EXT: Normal ROM in all joints; non-tender to palpation; no edema; normal capillary refill; no cyanosis, no calf tenderness or swelling    SKIN: Normal color for age and race; warm; no rash NEURO: Moves all extremities equally, no pronator drift, sensation to light touch intact diffusely, cranial nerves II through XII intact, normal speech PSYCH: The patient's mood and manner are appropriate. Grooming and personal hygiene are appropriate.  MEDICAL DECISION MAKING: Patient here with complaints of hypertension.  Blood pressure has come back down and is now at her baseline.  Her work-up here is unremarkable including normal labs, urine, chest x-ray and 2 troponins that have been negative.  EKG shows no ischemic abnormality.  She states that she checked her blood pressure tonight because she is supposed to check her blood pressure and heart rate regularly.  She did have a mild headache and some chest tightness but states that this is something that is normal for her.  Currently asymptomatic.  Neurologic exam normal.  Chest discomfort that she had today is unchanged compared  to the chest pain that she has had for over 10 years.  I do not feel she needs admission at this time for this chest pain.  We will have her follow-up closely with her primary care physician for further monitoring of her blood pressure as well as her chest pain.  Doubt ACS, PE, dissection today.  Nothing at this time to suggest intracranial hemorrhage of stroke.  Patient and daughter comfortable with plan for discharge home and watching blood pressure closely at home.  I do not feel that her blood pressure medication needs to be adjusted from the ER.  Discussed with patient and family that this can be managed by her PCP.   At this time, I do not feel there is any life-threatening condition present. I have reviewed and discussed all results (EKG, imaging, lab, urine as appropriate) and exam findings with patient/family. I have reviewed nursing notes and appropriate previous records.  I  feel the patient is safe to be discharged home without further emergent workup and can continue workup as an outpatient as needed. Discussed usual and customary return precautions. Patient/family verbalize understanding and are comfortable with this plan.  Outpatient follow-up has been provided as needed. All questions have been answered.       EKG Interpretation  Date/Time:  Tuesday July 26 2019 20:55:45 EDT Ventricular Rate:  63 PR Interval:  164 QRS Duration: 86 QT Interval:  398 QTC Calculation: 407 R Axis:   24 Text Interpretation:  Sinus rhythm with occasional Premature ventricular complexes Nonspecific ST abnormality Abnormal ECG No significant change since last tracing Reconfirmed by Kemari Narez, Cyril Mourning [phone]) on 07/26/2019 11:01:46 PM         Leighanna Kirn, Delice Bison, DO 07/27/19 0425

## 2019-07-27 NOTE — Discharge Instructions (Addendum)
Please continue your carvedilol and losartan as prescribed.  I recommend close follow-up with your primary care physician.

## 2019-07-29 DIAGNOSIS — I10 Essential (primary) hypertension: Secondary | ICD-10-CM | POA: Diagnosis not present

## 2019-07-29 DIAGNOSIS — R079 Chest pain, unspecified: Secondary | ICD-10-CM | POA: Diagnosis not present

## 2019-07-29 DIAGNOSIS — F339 Major depressive disorder, recurrent, unspecified: Secondary | ICD-10-CM | POA: Diagnosis not present

## 2019-08-08 ENCOUNTER — Ambulatory Visit: Payer: PPO

## 2019-08-18 NOTE — Progress Notes (Addendum)
Cardiology Office Note   Date:  08/19/2019   ID:  Brittany Douglas, DOB 10/13/1941, MRN [id number]  PCP:  Brittany Jordan, MD    No chief complaint on file.  HTN  Wt Readings from Last 3 Encounters:  08/19/19 132 lb 6.4 oz (60.1 kg)  06/28/19 132 lb (59.9 kg)  02/01/19 139 lb (63 kg)       History of Present Illness: Brittany Douglas is a 78 y.o. female who is being seen today for the evaluation of HTN at the request of Brittany Jordan, MD.  Brittany Douglas was in the ER in 70/6061: "78 year old female with history of hypertension, hyperlipidemia, anxiety who presents to the emergency department with concerns for hypertension.  Patient reports Brittany Douglas is on losartan 100 mg daily and carvedilol 6.125 mg twice daily.  States Brittany Douglas checked her blood pressure tonight because "I am supposed to check it".  Brittany Douglas states it was in the 220s/90s.  Reports her blood pressure normally runs in the [id number] to [id number] systolic.  Brittany Douglas had taken her losartan this morning but had not taken her second dose of carvedilol.  Took second dose of carvedilol and 5 mg of Valium for anxiety prior to arrival.  Did have some left-sided chest pressure but states that Brittany Douglas has had this intermittently with stress for over 10 years.  No associated shortness of breath, nausea, vomiting, diaphoresis.  Did have mild headache that has completely resolved.  No vision changes.  No numbness, focal weakness.  Did have some tingling in both hands and both feet which also happens when Brittany Douglas has anxiety.  At this is now resolved.  Patient here with complaints of hypertension.  Blood pressure has come back down and is now at her baseline.  Her work-up here is unremarkable including normal labs, urine, chest x-ray and 2 troponins that have been negative.  EKG shows no ischemic abnormality.  Brittany Douglas states that Brittany Douglas checked her blood pressure tonight because Brittany Douglas is supposed to check her blood pressure and heart rate regularly.  Brittany Douglas did have a mild headache and  some chest tightness but states that this is something that is normal for her.  Currently asymptomatic.  Neurologic exam normal.  Chest discomfort that Brittany Douglas had today is unchanged compared to the chest pain that Brittany Douglas has had for over 10 years.  I do not feel Brittany Douglas needs admission at this time for this chest pain.  We will have her follow-up closely with her primary care physician for further monitoring of her blood pressure as well as her chest pain.  Doubt ACS, PE, dissection today.  Nothing at this time to suggest intracranial hemorrhage of stroke.  Patient and daughter comfortable with plan for discharge home and watching blood pressure closely at home.  I do not feel that her blood pressure medication needs to be adjusted from the ER.  Discussed with patient and family that this can be managed by her PCP."  Since then, her BP has been up and down.  Brittany Douglas has felt tired.  Brittany Douglas does some walking.  Her silver sneakers was cancelled recently. Brittany Douglas has been using more valium recently due to more Valium.     Past Medical History:  Diagnosis Date  . Anxiety   . Aortic atherosclerosis ([place])   . Avascular necrosis of femur, right ([place])   . Balance problems   . Chronic kidney disease, stage II (mild)   . DDD (degenerative disc disease), lumbosacral   . Depression   .  DJD (degenerative joint disease), lumbar   . Elevated cholesterol   . GERD (gastroesophageal reflux disease)   . HOH (hard of hearing)   . Hypertension   . Hypertensive renal disease, benign   . Hypokalemia   . Hyponatremia   . Hypothyroidism   . IBS (irritable bowel syndrome)   . Major depression, recurrent ([place])   . Osteopenia   . Post-menopausal   . Reflux   . Subclinical hypothyroidism   . Varicose vein of leg   . Vitamin D deficiency     Past Surgical History:  Procedure Laterality Date  . BREAST LUMPECTOMY Bilateral   . BREAST SURGERY     Benign breast lumps  . CATARACT EXTRACTION    . CHOLECYSTECTOMY    . FOOT SURGERY     . VAGINAL HYSTERECTOMY     A & P repair     Current Outpatient Medications  Medication Sig Dispense Refill  . aspirin 81 MG tablet Take 81 mg by mouth daily.    Raelyn Ensign Pollen 550 MG CAPS Take 2 capsules by mouth.    . carvedilol (COREG) 6.25 MG tablet Take 6.25 mg by mouth 2 (two) times daily with a meal.    . Cetirizine HCl (ZYRTEC PO) Take by mouth.    . Cholecalciferol (VITAMIN D PO) Take by mouth.    . Coenzyme Q10 (COQ10) 100 MG CAPS Take by mouth.    . conjugated estrogens (PREMARIN) vaginal cream INSERT APPLICATORFUL VAGINALLY 2 TIMES A WEEK AS DIRECTED 30 g 2  . diazepam (VALIUM) 5 MG tablet Take 5 mg by mouth every 6 (six) hours as needed.    . Flaxseed, Linseed, (FLAX SEEDS) POWD Take by mouth.    . losartan (COZAAR) 100 MG tablet Take 100 mg by mouth daily.    . Multiple Vitamin (MULTIVITAMIN) tablet Take 1 tablet by mouth daily.    . Omega-3 Fatty Acids (FISH OIL) 1200 MG CAPS Take by mouth.    . Polyethylene Glycol 3350 (MIRALAX PO) Take by mouth.    . Probiotic Product (PROBIOTIC-10) CHEW Chew by mouth.    . rosuvastatin (CRESTOR) 5 MG tablet Take 5 mg by mouth daily.    . sertraline (ZOLOFT) 100 MG tablet Take 200 mg by mouth daily.     No current facility-administered medications for this visit.     Allergies:   Ace inhibitors, Amlodipine, Codeine, Cymbalta [duloxetine hcl], Doxycycline, Hctz [hydrochlorothiazide], Penicillins, and Sulfa antibiotics    Social History:  The patient  reports that Brittany Douglas has never smoked. Brittany Douglas has never used smokeless tobacco. Brittany Douglas reports that Brittany Douglas does not drink alcohol or use drugs.   Family History:  The patient's family history includes Breast cancer in her maternal aunt; Colon polyps in her daughter and son; Diabetes in her maternal grandmother; Heart attack in her brother and paternal grandmother; Hyperlipidemia in her sister; Hypertension in her mother; Other in her grandson; Stroke in her brother, father, and mother; Thyroid disease in  her sister; Uterine cancer in her mother.    ROS:  Please see the history of present illness.   Otherwise, review of systems are positive for anxiety.   All other systems are reviewed and negative.    PHYSICAL EXAM: VS:  BP 130/60   Pulse 64   Ht 5\' 2"  (1.575 m)   Wt 132 lb 6.4 oz (60.1 kg)   SpO2 98%   BMI 24.22 kg/m  , BMI Body mass index is 24.22 kg/m. GEN:  Well nourished, well developed, in no acute distress  HEENT: normal  Neck: no JVD, carotid bruits, or masses Cardiac: RRR; no murmurs, rubs, or gallops,no edema  Respiratory:  clear to auscultation bilaterally, normal work of breathing GI: soft, nontender, nondistended, + BS MS: no deformity or atrophy  Skin: warm and dry, no rash Neuro:  Strength and sensation are intact Psych: euthymic mood, full affect   EKG:   The ekg ordered 07/27/2019 demonstrates NSR, PVCs, no ST changes    Recent Labs: 07/26/2019: BUN 18; Creatinine, Ser 0.63; Hemoglobin 13.6; Platelets 192; Potassium 4.2; Sodium 135   Lipid Panel No results found for: CHOL, TRIG, HDL, CHOLHDL, VLDL, LDLCALC, LDLDIRECT   Other studies Reviewed: Additional studies/ records that were reviewed today with results demonstrating: LDL 78.   ASSESSMENT AND PLAN:  1. HTN: Some high readings.  Start amlodipine 5 mg daily. Decrease Coreg to 3.125 mg BID. If HR still slow, will stop Carvedilol at next visit.  COuld increase amlodipine to 10 mg daily in the future.  Brittany Douglas is not truly allergic to amlodipine.  THis was discussed with the patient and daughter. 2. Chest pain: atypical.  Worse with anxiety.   3. Fatigue: May improve with less beta blocker.  May be related to Valium BID from QD.     Current medicines are reviewed at length with the patient today.  The patient concerns regarding her medicines were addressed.  The following changes have been made:  No change  Labs/ tests ordered today include:  No orders of the defined types were placed in this encounter.    Recommend 150 minutes/week of aerobic exercise Low fat, low carb, high fiber diet recommended  Disposition:   FU in 2 weeks for BP check; Brittany Douglas has an accurate machine at home   Signed, Larae Grooms, MD  08/19/2019 2:52 PM    [facility] [address], [place], [place]  [address] Phone: [phone]; Fax: [phone]

## 2019-08-19 ENCOUNTER — Ambulatory Visit (INDEPENDENT_AMBULATORY_CARE_PROVIDER_SITE_OTHER): Payer: PPO | Admitting: Interventional Cardiology

## 2019-08-19 ENCOUNTER — Other Ambulatory Visit: Payer: Self-pay

## 2019-08-19 ENCOUNTER — Encounter: Payer: Self-pay | Admitting: Interventional Cardiology

## 2019-08-19 VITALS — BP 130/60 | HR 64 | Ht 62.0 in | Wt 132.4 lb

## 2019-08-19 DIAGNOSIS — R5383 Other fatigue: Secondary | ICD-10-CM | POA: Diagnosis not present

## 2019-08-19 DIAGNOSIS — I10 Essential (primary) hypertension: Secondary | ICD-10-CM | POA: Diagnosis not present

## 2019-08-19 DIAGNOSIS — R072 Precordial pain: Secondary | ICD-10-CM

## 2019-08-19 MED ORDER — AMLODIPINE BESYLATE 5 MG PO TABS: 5.0000 mg | ORAL_TABLET | Freq: Every day | ORAL | 3 refills | Status: DC

## 2019-08-19 MED ORDER — CARVEDILOL 3.125 MG PO TABS: 3.1250 mg | ORAL_TABLET | Freq: Two times a day (BID) | ORAL | 3 refills | Status: DC

## 2019-08-19 NOTE — Patient Instructions (Signed)
Medication Instructions:  Your physician has recommended you make the following change in your medication:  DECREASE CARVEDILOL DOSE  To 3.125 MG TWICE DAILY START AMLODIPINE 5 MG DAILY  If you need a refill on your cardiac medications before your next appointment, please call your pharmacy.   Lab work: None Ordered  If you have labs (blood work) drawn today and your tests are completely normal, you will receive your results only by: Marland Kitchen MyChart Message (if you have MyChart) OR . A paper copy in the mail If you have any lab test that is abnormal or we need to change your treatment, we will call you to review the results.  Testing/Procedures: None Ordered   Follow-Up: VIRTUAL VISIT WITH DR. VARANASI 08/29/19 AT 11:40  Any Other Special Instructions Will Be Listed Below (If Applicable).  Check Blood Pressure and Heart Rate daily and record

## 2019-08-29 ENCOUNTER — Telehealth: Payer: PPO | Admitting: Interventional Cardiology

## 2019-09-12 NOTE — Progress Notes (Signed)
Virtual Visit via Video Note   This visit type was conducted due to national recommendations for restrictions regarding the COVID-19 Pandemic (e.g. social distancing) in an effort to limit this patient's exposure and mitigate transmission in our community.  Due to her co-morbid illnesses, this patient is at least at moderate risk for complications without adequate follow up.  This format is felt to be most appropriate for this patient at this time.  All issues noted in this document were discussed and addressed.  A limited physical exam was performed with this format.  Please refer to the patient's chart for her consent to telehealth for [facility].   Date:  09/15/2019   ID:  Brittany Douglas, DOB 1941-08-02, MRN [id number]  Patient Location: Home Provider Location: Office  PCP:  [name], MD  Cardiologist:  No primary care provider on file. [place] Electrophysiologist:  None   Evaluation Performed:  Follow-Up Visit  Chief Complaint:  HTN/bradycardia  History of Present Illness:    Brittany Douglas is a 78 y.o. female who I have seen for HTN in 08/2019.  She was in the ER in 57/4265: "78 year old female with history of hypertension, hyperlipidemia, anxiety who presents to the emergency department with concerns for hypertension. Patient reports she is on losartan 100 mg daily and carvedilol 6.125 mg twice daily. States she checked her blood pressure tonight because "I am supposed to check it". She states it was in the 220s/90s. Reports her blood pressure normally runs in the [id number] to A999333 systolic. She had taken her losartan this morning but had not taken her second dose of carvedilol. Took second dose of carvedilol and 5 mg of Valium for anxiety prior to arrival. Did have some left-sided chest pressure but states that she has had this intermittently with stress for over 10 years. No associated shortness of breath, nausea, vomiting, diaphoresis. Did have mild  headache that has completely resolved. No vision changes. No numbness, focal weakness. Did have some tingling in both hands and both feet which also happens when she has anxiety. At this is now resolved.  Patient here with complaints of hypertension. Blood pressure has come back down and is now at her baseline. Her work-up here is unremarkable including normal labs, urine, chest x-ray and 2 troponins that have been negative. EKG shows no ischemic abnormality. She states that she checked her blood pressure tonight because she is supposed to check her blood pressure and heart rate regularly. She did have a mild headache and some chest tightness but states that this is something that is normal for her. Currently asymptomatic. Neurologic exam normal. Chest discomfort that she had today is unchanged compared to the chest pain that she has had for over 10 years. I do not feel she needs admission at this time for this chest pain. We will have her follow-up closely with her primary care physician for further monitoring of her blood pressure as well as her chest pain. Doubt ACS, PE, dissection today. Nothing at this time to suggest intracranial hemorrhage of stroke. Patient and daughter comfortable with plan for discharge home and watching blood pressure closely at home. I do not feel that her blood pressure medication needs to be adjusted from the ER. Discussed with patient and family that this can be managed by her PCP."  On 08/19/2019, "Since then, her BP has been up and down.  She has felt tired.  She does some walking.  Her silver sneakers was cancelled recently. SHe has  been using more valium recently due to more anxiety."  Plan at that visit was: "Start amlodipine 5 mg daily. Decrease Coreg to 3.125 mg BID. If HR still slow, will stop Carvedilol at next visit.  COuld increase amlodipine to 10 mg daily in the future.  She is not truly allergic to amlodipine.  THis was discussed with the patient  and daughter."  Daughter, who is NP was going to check BP at home and keep record.  BP has been better since the last visit.  BP range 132-175/55-65.  HR has been in the 50s.  Average systolic is in the mid 0000000.   Valium dose was reduced as well.  She is less sleepy.  The patient does not have symptoms concerning for COVID-19 infection (fever, chills, cough, or new shortness of breath).    Past Medical History:  Diagnosis Date   Anxiety    Aortic atherosclerosis (HCC)    Avascular necrosis of femur, right (HCC)    Balance problems    Chronic kidney disease, stage II (mild)    DDD (degenerative disc disease), lumbosacral    Depression    DJD (degenerative joint disease), lumbar    Elevated cholesterol    GERD (gastroesophageal reflux disease)    HOH (hard of hearing)    Hypertension    Hypertensive renal disease, benign    Hypokalemia    Hyponatremia    Hypothyroidism    IBS (irritable bowel syndrome)    Major depression, recurrent (HCC)    Osteopenia    Post-menopausal    Reflux    Subclinical hypothyroidism    Varicose vein of leg    Vitamin D deficiency    Past Surgical History:  Procedure Laterality Date   BREAST LUMPECTOMY Bilateral    BREAST SURGERY     Benign breast lumps   CATARACT EXTRACTION     CHOLECYSTECTOMY     FOOT SURGERY     VAGINAL HYSTERECTOMY     A & P repair     Current Meds  Medication Sig   amLODipine (NORVASC) 5 MG tablet Take 1 tablet (5 mg total) by mouth daily.   aspirin 81 MG tablet Take 81 mg by mouth daily.   Bee Pollen 550 MG CAPS Take 2 capsules by mouth.   carvedilol (COREG) 3.125 MG tablet Take 1 tablet (3.125 mg total) by mouth 2 (two) times daily.   Cetirizine HCl (ZYRTEC PO) Take by mouth.   Cholecalciferol (VITAMIN D PO) Take by mouth.   Coenzyme Q10 (COQ10) 100 MG CAPS Take by mouth.   conjugated estrogens (PREMARIN) vaginal cream INSERT APPLICATORFUL VAGINALLY 2 TIMES A WEEK AS DIRECTED    diazepam (VALIUM) 2 MG tablet Take 1 mg by mouth 2 (two) times daily.   Flaxseed, Linseed, (FLAX SEEDS) POWD Take by mouth.   losartan (COZAAR) 100 MG tablet Take 100 mg by mouth daily.   Multiple Vitamin (MULTIVITAMIN) tablet Take 1 tablet by mouth daily.   Omega-3 Fatty Acids (FISH OIL) 1200 MG CAPS Take by mouth.   Polyethylene Glycol 3350 (MIRALAX PO) Take by mouth.   rosuvastatin (CRESTOR) 5 MG tablet Take 5 mg by mouth daily.   sertraline (ZOLOFT) 100 MG tablet Take 200 mg by mouth daily.     Allergies:   Ace inhibitors, Amlodipine, Codeine, Cymbalta [duloxetine hcl], Doxycycline, Hctz [hydrochlorothiazide], Penicillins, and Sulfa antibiotics   Social History   Tobacco Use   Smoking status: Never Smoker   Smokeless tobacco: Never Used  Substance  Use Topics   Alcohol use: No    Alcohol/week: 0.0 standard drinks   Drug use: No     Family Hx: The patient's family history includes Breast cancer in her maternal aunt; Colon polyps in her daughter and son; Diabetes in her maternal grandmother; Heart attack in her brother and paternal grandmother; Hyperlipidemia in her sister; Hypertension in her mother; Other in her grandson; Stroke in her brother, father, and mother; Thyroid disease in her sister; Uterine cancer in her mother.  ROS:   Please see the history of present illness.     All other systems reviewed and are negative.   Prior CV studies:   The following studies were reviewed today:    Labs/Other Tests and Data Reviewed:    EKG:  No ECG reviewed.  Recent Labs: 07/26/2019: BUN 18; Creatinine, Ser 0.63; Hemoglobin 13.6; Platelets 192; Potassium 4.2; Sodium 135   Recent Lipid Panel No results found for: CHOL, TRIG, HDL, CHOLHDL, LDLCALC, LDLDIRECT  Wt Readings from Last 3 Encounters:  09/15/19 129 lb (58.5 kg)  08/19/19 132 lb 6.4 oz (60.1 kg)  06/28/19 132 lb (59.9 kg)     Objective:    Vital Signs:  BP (!) 167/66    Pulse (!) 57    Ht 5\' 2"   (1.575 m)    Wt 129 lb (58.5 kg)    BMI 23.59 kg/m    VITAL SIGNS:  reviewed GEN:  no acute distress RESPIRATORY:  no shortness of breat NEURO:  alert and oriented x 3, no obvious focal deficit PSYCH:  normal affect exam limited by video format  ASSESSMENT & PLAN:    1. HTN: Stop Coreg.  Increase amlodipine to 5 mg BID.  She may hold the amlopodine if her systolic < [id number].  I suspect, she will typically need the BID dosing of amlodipine. 2. Chest pain: worse with anxiety.  Has been stable, even on reduced Valium.  3. Fatigue: The hope was that it would be better with less beta blocker.  Better with less Valium.  COVID-19 Education: The signs and symptoms of COVID-19 were discussed with the patient and how to seek care for testing (follow up with PCP or arrange E-visit).  The importance of social distancing was discussed today.  Time:   Today, I have spent 12  minutes with the patient with telehealth technology discussing the above problems.     Medication Adjustments/Labs and Tests Ordered: Current medicines are reviewed at length with the patient today.  Concerns regarding medicines are outlined above.   Tests Ordered: No orders of the defined types were placed in this encounter.   Medication Changes: No orders of the defined types were placed in this encounter.   Follow Up:  Virtual Visit in 3 week(s)  Signed, Larae Grooms, MD  09/15/2019 10:17 AM    [place]

## 2019-09-15 ENCOUNTER — Encounter: Payer: Self-pay | Admitting: Interventional Cardiology

## 2019-09-15 ENCOUNTER — Telehealth (INDEPENDENT_AMBULATORY_CARE_PROVIDER_SITE_OTHER): Payer: PPO | Admitting: Interventional Cardiology

## 2019-09-15 ENCOUNTER — Telehealth: Payer: Self-pay

## 2019-09-15 ENCOUNTER — Other Ambulatory Visit: Payer: Self-pay

## 2019-09-15 VITALS — BP 167/66 | HR 57 | Ht 62.0 in | Wt 129.0 lb

## 2019-09-15 DIAGNOSIS — R5383 Other fatigue: Secondary | ICD-10-CM

## 2019-09-15 DIAGNOSIS — I10 Essential (primary) hypertension: Secondary | ICD-10-CM | POA: Diagnosis not present

## 2019-09-15 DIAGNOSIS — R072 Precordial pain: Secondary | ICD-10-CM

## 2019-09-15 MED ORDER — AMLODIPINE BESYLATE 5 MG PO TABS: 5.0000 mg | ORAL_TABLET | Freq: Two times a day (BID) | ORAL | 3 refills | Status: DC

## 2019-09-15 NOTE — Telephone Encounter (Signed)
Yes, Spoke to pt who gave verbal consent for virtual video visit on 09/15/19.

## 2019-09-15 NOTE — Patient Instructions (Addendum)
Medication Instructions:  Your physician has recommended you make the following change in your medication:   1. STOP: carvedilol (coreg)  2. INCREASE: amlodipine (norvasc) 5 mg tablet: Take 1 tablet by mouth TWICE a day (Check your Blood Pressure before your second dose of amlodipine and if your Systolic Blood Pressure (the top number) is less than 140, then do not take your second dose)    Lab work: None Ordered  If you have labs (blood work) drawn today and your tests are completely normal, you will receive your results only by: Marland Kitchen MyChart Message (if you have MyChart) OR . A paper copy in the mail If you have any lab test that is abnormal or we need to change your treatment, we will call you to review the results.  Testing/Procedures: None ordered  Follow-Up: . Follow up with Dr. Irish Lack via VIDEO Visit on 09/29/19 at 10:00 AM  Any Other Special Instructions Will Be Listed Below (If Applicable).

## 2019-09-22 ENCOUNTER — Encounter: Payer: Self-pay | Admitting: Gynecology

## 2019-09-23 DIAGNOSIS — H00015 Hordeolum externum left lower eyelid: Secondary | ICD-10-CM | POA: Diagnosis not present

## 2019-09-29 ENCOUNTER — Other Ambulatory Visit: Payer: Self-pay

## 2019-09-29 ENCOUNTER — Encounter: Payer: Self-pay | Admitting: Interventional Cardiology

## 2019-09-29 ENCOUNTER — Telehealth (INDEPENDENT_AMBULATORY_CARE_PROVIDER_SITE_OTHER): Payer: PPO | Admitting: Interventional Cardiology

## 2019-09-29 VITALS — BP 147/51 | HR 69 | Ht 62.0 in | Wt 128.0 lb

## 2019-09-29 DIAGNOSIS — I10 Essential (primary) hypertension: Secondary | ICD-10-CM

## 2019-09-29 NOTE — Patient Instructions (Signed)
Medication Instructions:  Your physician recommends that you continue on your current medications as directed. Please refer to the Current Medication list given to you today.  If you need a refill on your cardiac medications before your next appointment, please call your pharmacy.   Lab work: None Ordered  If you have labs (blood work) drawn today and your tests are completely normal, you will receive your results only by: . MyChart Message (if you have MyChart) OR . A paper copy in the mail If you have any lab test that is abnormal or we need to change your treatment, we will call you to review the results.  Testing/Procedures: None ordered  Follow-Up: . AS NEEDED  Any Other Special Instructions Will Be Listed Below (If Applicable).    

## 2019-09-29 NOTE — Progress Notes (Signed)
Virtual Visit via Video Note   This visit type was conducted due to national recommendations for restrictions regarding the COVID-19 Pandemic (e.g. social distancing) in an effort to limit this patient's exposure and mitigate transmission in our community.  Due to her co-morbid illnesses, this patient is at least at moderate risk for complications without adequate follow up.  This format is felt to be most appropriate for this patient at this time.  All issues noted in this document were discussed and addressed.  A limited physical exam was performed with this format.  Please refer to the patient's chart for her consent to telehealth for Brittany Douglas.   Date:  09/29/2019   ID:  Brittany Douglas, DOB 02/20/1941, MRN [id number]  Patient Location: Home Provider Location: Office  PCP:  [name], MD  Cardiologist:  No primary care provider on file. [place] Electrophysiologist:  None   Evaluation Performed:  Follow-Up Visit  Chief Complaint:  HTN  History of Present Illness:    [name] is a 78 y.o. female with She was in the ER in 51/2655: "78 year old female with history of hypertension, hyperlipidemia, anxiety who presents to the emergency department with concerns for hypertension. Patient reports she is on losartan 100 mg daily and carvedilol 6.125 mg twice daily. States she checked her blood pressure tonight because "I am supposed to check it". She states it was in the 220s/90s. Reports her blood pressure normally runs in the XX123456 to A999333 systolic. She had taken her losartan this morning but had not taken her second dose of carvedilol. Took second dose of carvedilol and 5 mg of Valium for anxiety prior to arrival. Did have some left-sided chest pressure but states that she has had this intermittently with stress for over 10 years. No associated shortness of breath, nausea, vomiting, diaphoresis. Did have mild headache that has completely resolved. No vision  changes. No numbness, focal weakness. Did have some tingling in both hands and both feet which also happens when she has anxiety. At this is now resolved.  Patient here with complaints of hypertension. Blood pressure has come back down and is now at her baseline. Her work-up here is unremarkable including normal labs, urine, chest x-ray and 2 troponins that have been negative. EKG shows no ischemic abnormality. She states that she checked her blood pressure tonight because she is supposed to check her blood pressure and heart rate regularly. She did have a mild headache and some chest tightness but states that this is something that is normal for her. Currently asymptomatic. Neurologic exam normal. Chest discomfort that she had today is unchanged compared to the chest pain that she has had for over 10 years. I do not feel she needs admission at this time for this chest pain. We will have her follow-up closely with her primary care physician for further monitoring of her blood pressure as well as her chest pain. Doubt ACS, PE, dissection today. Nothing at this time to suggest intracranial hemorrhage of stroke. Patient and daughter comfortable with plan for discharge home and watching blood pressure closely at home. I do not feel that her blood pressure medication needs to be adjusted from the ER. Discussed with patient and family that this can be managed by her PCP."  On 08/19/2019, "Since then, her BP has been up and down. She has felt tired. She does some walking. Her silver sneakers was cancelled recently. SHe has been using more valium recently due to more anxiety."  BP  has been better, 138-150s typically.  HR in the 60s.  Doing some walking in the house.  Denies : Chest pain. Dizziness. Leg edema. Nitroglycerin use. Orthopnea. Palpitations. Paroxysmal nocturnal dyspnea. Shortness of breath. Syncope.   The patient does not have symptoms concerning for COVID-19 infection (fever,  chills, cough, or new shortness of breath).    Past Medical History:  Diagnosis Date   Anxiety    Aortic atherosclerosis (HCC)    Avascular necrosis of femur, right (HCC)    Balance problems    Chronic kidney disease, stage II (mild)    DDD (degenerative disc disease), lumbosacral    Depression    DJD (degenerative joint disease), lumbar    Elevated cholesterol    GERD (gastroesophageal reflux disease)    HOH (hard of hearing)    Hypertension    Hypertensive renal disease, benign    Hypokalemia    Hyponatremia    Hypothyroidism    IBS (irritable bowel syndrome)    Major depression, recurrent (HCC)    Osteopenia    Post-menopausal    Reflux    Subclinical hypothyroidism    Varicose vein of leg    Vitamin D deficiency    Past Surgical History:  Procedure Laterality Date   BREAST LUMPECTOMY Bilateral    BREAST SURGERY     Benign breast lumps   CATARACT EXTRACTION     CHOLECYSTECTOMY     FOOT SURGERY     VAGINAL HYSTERECTOMY     A & P repair     Current Meds  Medication Sig   amLODipine (NORVASC) 5 MG tablet Take 1 tablet (5 mg total) by mouth 2 (two) times daily.   aspirin 81 MG tablet Take 81 mg by mouth daily.   Bee Pollen 550 MG CAPS Take 2 capsules by mouth.   Cetirizine HCl (ZYRTEC PO) Take by mouth.   Cholecalciferol (VITAMIN D PO) Take by mouth.   Coenzyme Q10 (COQ10) 100 MG CAPS Take by mouth.   conjugated estrogens (PREMARIN) vaginal cream INSERT APPLICATORFUL VAGINALLY 2 TIMES A WEEK AS DIRECTED   Flaxseed, Linseed, (FLAX SEEDS) POWD Take by mouth.   losartan (COZAAR) 100 MG tablet Take 100 mg by mouth daily.   Multiple Vitamin (MULTIVITAMIN) tablet Take 1 tablet by mouth daily.   Omega-3 Fatty Acids (FISH OIL) 1200 MG CAPS Take by mouth.   Polyethylene Glycol 3350 (MIRALAX PO) Take by mouth.   rosuvastatin (CRESTOR) 5 MG tablet Take 5 mg by mouth daily.   sertraline (ZOLOFT) 100 MG tablet Take 200 mg by mouth  daily.   [DISCONTINUED] diazepam (VALIUM) 2 MG tablet Take 1 mg by mouth 2 (two) times daily.     Allergies:   Ace inhibitors, Amlodipine, Codeine, Cymbalta [duloxetine hcl], Doxycycline, Hctz [hydrochlorothiazide], Penicillins, and Sulfa antibiotics   Social History   Tobacco Use   Smoking status: Never Smoker   Smokeless tobacco: Never Used  Substance Use Topics   Alcohol use: No    Alcohol/week: 0.0 standard drinks   Drug use: No     Family Hx: The patient's family history includes Breast cancer in her maternal aunt; Colon polyps in her daughter and son; Diabetes in her maternal grandmother; Heart attack in her brother and paternal grandmother; Hyperlipidemia in her sister; Hypertension in her mother; Other in her grandson; Stroke in her brother, father, and mother; Thyroid disease in her sister; Uterine cancer in her mother.  ROS:   Please see the history of present illness.  Persistent anxiety All other systems reviewed and are negative.   Prior CV studies:   The following studies were reviewed today:    Labs/Other Tests and Data Reviewed:    EKG:  No ECG reviewed.  Recent Labs: 07/26/2019: BUN 18; Creatinine, Ser 0.63; Hemoglobin 13.6; Platelets 192; Potassium 4.2; Sodium 135   Recent Lipid Panel No results found for: CHOL, TRIG, HDL, CHOLHDL, LDLCALC, LDLDIRECT  Wt Readings from Last 3 Encounters:  09/29/19 128 lb (58.1 kg)  09/15/19 129 lb (58.5 kg)  08/19/19 132 lb 6.4 oz (60.1 kg)     Objective:    Vital Signs:  BP (!) 147/51    Pulse 69    Ht 5\' 2"  (1.575 m)    Wt 128 lb (58.1 kg)    BMI 23.41 kg/m    VITAL SIGNS:  reviewed GEN:  no acute distress RESPIRATORY:  normal respiratory effort, symmetric expansion NEURO:  alert and oriented x 3, no obvious focal deficit PSYCH:  normal affect exam limited by video format  ASSESSMENT & PLAN:    1. HTN: Better controlled. She is comfortable where her BP readings are.  SHe is losing weight as well so  I do not want to increase her meds at this time. Continue current meds.  Continue regular exercise, safely- avoiding falls. Minimize salt in diet.   COVID-19 Education: The signs and symptoms of COVID-19 were discussed with the patient and how to seek care for testing (follow up with PCP or arrange E-visit).  The importance of social distancing was discussed today.  Time:   Today, I have spent 14 minutes with the patient with telehealth technology discussing the above problems.     Medication Adjustments/Labs and Tests Ordered: Current medicines are reviewed at length with the patient today.  Concerns regarding medicines are outlined above.   Tests Ordered: No orders of the defined types were placed in this encounter.   Medication Changes: No orders of the defined types were placed in this encounter.   Follow Up:  prn prn  Signed, Larae Grooms, MD  09/29/2019 10:10 AM    [place]

## 2019-10-06 DIAGNOSIS — H0015 Chalazion left lower eyelid: Secondary | ICD-10-CM | POA: Diagnosis not present

## 2019-11-29 DIAGNOSIS — E7800 Pure hypercholesterolemia, unspecified: Secondary | ICD-10-CM | POA: Diagnosis not present

## 2019-11-29 DIAGNOSIS — I10 Essential (primary) hypertension: Secondary | ICD-10-CM | POA: Diagnosis not present

## 2019-11-29 DIAGNOSIS — E039 Hypothyroidism, unspecified: Secondary | ICD-10-CM | POA: Diagnosis not present

## 2019-11-29 DIAGNOSIS — R0982 Postnasal drip: Secondary | ICD-10-CM | POA: Diagnosis not present

## 2019-12-14 DIAGNOSIS — I10 Essential (primary) hypertension: Secondary | ICD-10-CM | POA: Diagnosis not present

## 2019-12-14 DIAGNOSIS — E039 Hypothyroidism, unspecified: Secondary | ICD-10-CM | POA: Diagnosis not present

## 2019-12-14 DIAGNOSIS — E7800 Pure hypercholesterolemia, unspecified: Secondary | ICD-10-CM | POA: Diagnosis not present

## 2020-04-25 ENCOUNTER — Other Ambulatory Visit: Payer: Self-pay | Admitting: Family Medicine

## 2020-04-25 DIAGNOSIS — Z1231 Encounter for screening mammogram for malignant neoplasm of breast: Secondary | ICD-10-CM

## 2020-05-02 ENCOUNTER — Other Ambulatory Visit: Payer: Self-pay | Admitting: Family Medicine

## 2020-05-02 DIAGNOSIS — E2839 Other primary ovarian failure: Secondary | ICD-10-CM

## 2020-06-05 DIAGNOSIS — J302 Other seasonal allergic rhinitis: Secondary | ICD-10-CM | POA: Diagnosis not present

## 2020-06-05 DIAGNOSIS — H903 Sensorineural hearing loss, bilateral: Secondary | ICD-10-CM | POA: Diagnosis not present

## 2020-06-05 DIAGNOSIS — H93293 Other abnormal auditory perceptions, bilateral: Secondary | ICD-10-CM | POA: Diagnosis not present

## 2020-06-05 DIAGNOSIS — H6123 Impacted cerumen, bilateral: Secondary | ICD-10-CM | POA: Diagnosis not present

## 2020-06-19 ENCOUNTER — Other Ambulatory Visit: Payer: PPO

## 2020-06-19 ENCOUNTER — Ambulatory Visit
Admission: RE | Admit: 2020-06-19 | Discharge: 2020-06-19 | Disposition: A | Discharge status: Home / Self Care | Payer: PPO | Source: Ambulatory Visit | Attending: Family Medicine | Admitting: Family Medicine

## 2020-06-19 ENCOUNTER — Other Ambulatory Visit: Payer: Self-pay

## 2020-06-19 DIAGNOSIS — Z780 Asymptomatic menopausal state: Secondary | ICD-10-CM | POA: Diagnosis not present

## 2020-06-19 DIAGNOSIS — E2839 Other primary ovarian failure: Secondary | ICD-10-CM

## 2020-06-19 DIAGNOSIS — M810 Age-related osteoporosis without current pathological fracture: Secondary | ICD-10-CM | POA: Diagnosis not present

## 2020-06-19 DIAGNOSIS — M85851 Other specified disorders of bone density and structure, right thigh: Secondary | ICD-10-CM | POA: Diagnosis not present

## 2020-06-26 DIAGNOSIS — E039 Hypothyroidism, unspecified: Secondary | ICD-10-CM | POA: Diagnosis not present

## 2020-06-26 DIAGNOSIS — E559 Vitamin D deficiency, unspecified: Secondary | ICD-10-CM | POA: Diagnosis not present

## 2020-06-26 DIAGNOSIS — Z79899 Other long term (current) drug therapy: Secondary | ICD-10-CM | POA: Diagnosis not present

## 2020-06-26 DIAGNOSIS — E7800 Pure hypercholesterolemia, unspecified: Secondary | ICD-10-CM | POA: Diagnosis not present

## 2020-06-28 ENCOUNTER — Encounter: Payer: Self-pay | Admitting: Nurse Practitioner

## 2020-06-28 ENCOUNTER — Other Ambulatory Visit: Payer: Self-pay

## 2020-06-28 ENCOUNTER — Ambulatory Visit (INDEPENDENT_AMBULATORY_CARE_PROVIDER_SITE_OTHER): Payer: PPO | Admitting: Nurse Practitioner

## 2020-06-28 VITALS — BP 123/74 | Ht 62.0 in | Wt 128.6 lb

## 2020-06-28 DIAGNOSIS — Z01419 Encounter for gynecological examination (general) (routine) without abnormal findings: Secondary | ICD-10-CM | POA: Diagnosis not present

## 2020-06-28 DIAGNOSIS — M810 Age-related osteoporosis without current pathological fracture: Secondary | ICD-10-CM

## 2020-06-28 LAB — URINALYSIS
Bilirubin Urine: NEGATIVE
Glucose, UA: NEGATIVE
Hgb urine dipstick: NEGATIVE
Ketones, ur: NEGATIVE
Leukocytes,Ua: NEGATIVE
Nitrite: NEGATIVE
Protein, ur: NEGATIVE
Specific Gravity, Urine: 1.015 (ref 1.001–1.03)
pH: 8.5 — ABNORMAL HIGH (ref 5.0–8.0)

## 2020-06-28 NOTE — Progress Notes (Addendum)
° °  Brittany Douglas June 30, 1941 [id number]   History:  79 y.o. G2 P2 presents for breast and pelvic exam without GYN complaints. 1972 TVH, using premarin for vaginal dryness/atrophy. Osteoporosis managed by PCP, she has appointment with Dr. Stephanie Acre next week to discuss bone density and treatment options.   Gynecologic History No LMP recorded. Patient has had a hysterectomy.   Contraception: status post hysterectomy Last Pap: no longer screening per guidelines and patient request Last mammogram: 07/12/2019. Results were: normal Last colonoscopy: 2015. Results were: normal Last Dexa: 06/19/2020. Results were: t score -2.8  Past medical history, past surgical history, family history and social history were all reviewed and documented in the EPIC chart.  ROS:  A ROS was performed and pertinent positives and negatives are included.  Exam:  Vitals:   06/28/20 1113  BP: 123/74  Weight: 128 lb 9.6 oz (58.3 kg)  Height: 5\' 2"  (1.575 m)   Body mass index is 23.52 kg/m.  General appearance:  Normal Thyroid:  Symmetrical, normal in size, without palpable masses or nodularity. Respiratory  Auscultation:  Clear without wheezing or rhonchi Cardiovascular  Auscultation:  Regular rate, without rubs, murmurs or gallops  Edema/varicosities:  Not grossly evident Abdominal  Soft,nontender, without masses, guarding or rebound.  Liver/spleen:  No organomegaly noted  Hernia:  None appreciated  Skin  Inspection:  Grossly normal   Breasts: Examined lying and sitting.   Right: Without masses, retractions, discharge or axillary adenopathy.   Left: Without masses, retractions, discharge or axillary adenopathy. Gentitourinary   Inguinal/mons:  Normal without inguinal adenopathy  External genitalia:  Normal  BUS/Urethra/Skene's glands:  Normal  Vagina:  atrophy  Cervix:  Absent  Uterus:  Absent  Adnexa/parametria:     Rt: Without masses or tenderness.   Lt: Without masses or  tenderness.  Anus and perineum: Normal  Digital rectal exam: Normal sphincter tone without palpated masses or tenderness  Assessment/Plan:  79 y.o. G2 P2 for breast and pelvic exam.   Well female exam with routine gynecological exam - Education provided on SBEs, importance of preventative screenings, current guidelines, high calcium diet, regular exercise, and multivitamin daily.   Age-related osteoporosis without current pathological fracture - managed by PCP. Taking daily vitamin D supplement and multivitamin. Unable to tolerated calcium supplement in the past due to constipation. Tries to be active at home but has been having back and knee pain due to arthritis. [facility] to pandemic [facility] [employer]   Follow up in 1 year for annual       [place], 11:32 AM 06/28/2020

## 2020-06-28 NOTE — Patient Instructions (Addendum)
Osteoporosis  Osteoporosis happens when your bones get thin and weak. This can cause your bones to break (fracture) more easily. You can do things at home to make your bones stronger. Follow these instructions at home:  Activity  Exercise as told by your doctor. Ask your doctor what activities are safe for you. You should do: ? Exercises that make your muscles work to hold your body weight up (weight-bearing exercises). These include tai chi, yoga, and walking. ? Exercises to make your muscles stronger. One example is lifting weights. Lifestyle  Limit alcohol intake to no more than 1 drink a day for nonpregnant women and 2 drinks a day for men. One drink equals 12 oz of beer, 5 oz of wine, or 1 oz of hard liquor.  Do not use any products that have nicotine or tobacco in them. These include cigarettes and e-cigarettes. If you need help quitting, ask your doctor. Preventing falls  Use tools to help you move around (mobility aids) as needed. These include canes, walkers, scooters, and crutches.  Keep rooms well-lit and free of clutter.  Put away things that could make you trip. These include cords and rugs.  Install safety rails on stairs. Install grab bars in bathrooms.  Use rubber mats in slippery areas, like bathrooms.  Wear shoes that: ? Fit you well. ? Support your feet. ? Have closed toes. ? Have rubber soles or low heels.  Tell your doctor about all of the medicines you are taking. Some medicines can make you more likely to fall. General instructions  Eat plenty of calcium and vitamin D. These nutrients are good for your bones. Good sources of calcium and vitamin D include: ? Some fatty fish, such as salmon and tuna. ? Foods that have calcium and vitamin D added to them (fortified foods). For example, some breakfast cereals are fortified with calcium and vitamin D. ? Egg yolks. ? Cheese. ? Liver.  Take over-the-counter and prescription medicines only as told by your  doctor.  Keep all follow-up visits as told by your doctor. This is important. Contact a doctor if:  You have not been tested (screened) for osteoporosis and you are: ? A woman who is age 91 or older. ? A man who is age 46 or older. Get help right away if:  You fall.  You get hurt. Summary  Osteoporosis happens when your bones get thin and weak.  Weak bones can break (fracture) more easily.  Eat plenty of calcium and vitamin D. These nutrients are good for your bones.  Tell your doctor about all of the medicines that you take. This information is not intended to replace advice given to you by your health care provider. Make sure you discuss any questions you have with your health care provider. Document Revised: 11/13/2017 Document Reviewed: 09/25/2017 Elsevier Patient Education  2020 [employer] After Age 67 After age 24, you are at a higher risk for certain long-term diseases and infections as well as injuries from falls. Falls are a major cause of broken bones and head injuries in people who are older than age 22. Getting regular preventive care can help to keep you healthy and well. Preventive care includes getting regular testing and making lifestyle changes as recommended by your health care provider. Talk with your health care provider about:  Which screenings and tests you should have. A screening is a test that checks for a disease when you have no symptoms.  A diet and exercise  plan that is right for you. What should I know about screenings and tests to prevent falls? Screening and testing are the best ways to find a health problem early. Early diagnosis and treatment give you the best chance of managing medical conditions that are common after age 64. Certain conditions and lifestyle choices may make you more likely to have a fall. Your health care provider may recommend:  Regular vision checks. Poor vision and conditions such as cataracts can make  you more likely to have a fall. If you wear glasses, make sure to get your prescription updated if your vision changes.  Medicine review. Work with your health care provider to regularly review all of the medicines you are taking, including over-the-counter medicines. Ask your health care provider about any side effects that may make you more likely to have a fall. Tell your health care provider if any medicines that you take make you feel dizzy or sleepy.  Osteoporosis screening. Osteoporosis is a condition that causes the bones to get weaker. This can make the bones weak and cause them to break more easily.  Blood pressure screening. Blood pressure changes and medicines to control blood pressure can make you feel dizzy.  Strength and balance checks. Your health care provider may recommend certain tests to check your strength and balance while standing, walking, or changing positions.  Foot health exam. Foot pain and numbness, as well as not wearing proper footwear, can make you more likely to have a fall.  Depression screening. You may be more likely to have a fall if you have a fear of falling, feel emotionally low, or feel unable to do activities that you used to do.  Alcohol use screening. Using too much alcohol can affect your balance and may make you more likely to have a fall. What actions can I take to lower my risk of falls? General instructions  Talk with your health care provider about your risks for falling. Tell your health care provider if: ? You fall. Be sure to tell your health care provider about all falls, even ones that seem minor. ? You feel dizzy, sleepy, or off-balance.  Take over-the-counter and prescription medicines only as told by your health care provider. These include any supplements.  Eat a healthy diet and maintain a healthy weight. A healthy diet includes low-fat dairy products, low-fat (lean) meats, and fiber from whole grains, beans, and lots of fruits and  vegetables. Home safety  Remove any tripping hazards, such as rugs, cords, and clutter.  Install safety equipment such as grab bars in bathrooms and safety rails on stairs.  Keep rooms and walkways well-lit. Activity   Follow a regular exercise program to stay fit. This will help you maintain your balance. Ask your health care provider what types of exercise are appropriate for you.  If you need a cane or walker, use it as recommended by your health care provider.  Wear supportive shoes that have nonskid soles. Lifestyle  Do not drink alcohol if your health care provider tells you not to drink.  If you drink alcohol, limit how much you have: ? 0-1 drink a day for women. ? 0-2 drinks a day for men.  Be aware of how much alcohol is in your drink. In the [place] one drink equals one typical bottle of beer (12 oz), one-half glass of wine (5 oz), or one shot of hard liquor (1 oz).  Do not use any products that contain nicotine or tobacco, such  as cigarettes and e-cigarettes. If you need help quitting, ask your health care provider. Summary  Having a healthy lifestyle and getting preventive care can help to protect your health and wellness after age 24.  Screening and testing are the best way to find a health problem early and help you avoid having a fall. Early diagnosis and treatment give you the best chance for managing medical conditions that are more common for people who are older than age 15.  Falls are a major cause of broken bones and head injuries in people who are older than age 79. Take precautions to prevent a fall at home.  Work with your health care provider to learn what changes you can make to improve your health and wellness and to prevent falls. This information is not intended to replace advice given to you by your health care provider. Make sure you discuss any questions you have with your health care provider. Document Revised: 03/24/2019 Document Reviewed:  10/14/2017 Elsevier Patient Education  2020 [employer].

## 2020-07-02 DIAGNOSIS — Z0000 Encounter for general adult medical examination without abnormal findings: Secondary | ICD-10-CM | POA: Diagnosis not present

## 2020-07-02 DIAGNOSIS — F339 Major depressive disorder, recurrent, unspecified: Secondary | ICD-10-CM | POA: Diagnosis not present

## 2020-07-02 DIAGNOSIS — K219 Gastro-esophageal reflux disease without esophagitis: Secondary | ICD-10-CM | POA: Diagnosis not present

## 2020-07-02 DIAGNOSIS — E039 Hypothyroidism, unspecified: Secondary | ICD-10-CM | POA: Diagnosis not present

## 2020-07-02 DIAGNOSIS — Z79899 Other long term (current) drug therapy: Secondary | ICD-10-CM | POA: Diagnosis not present

## 2020-07-02 DIAGNOSIS — M5137 Other intervertebral disc degeneration, lumbosacral region: Secondary | ICD-10-CM | POA: Diagnosis not present

## 2020-07-02 DIAGNOSIS — I700 Atherosclerosis of aorta: Secondary | ICD-10-CM | POA: Diagnosis not present

## 2020-07-02 DIAGNOSIS — I10 Essential (primary) hypertension: Secondary | ICD-10-CM | POA: Diagnosis not present

## 2020-07-02 DIAGNOSIS — M818 Other osteoporosis without current pathological fracture: Secondary | ICD-10-CM | POA: Diagnosis not present

## 2020-07-02 DIAGNOSIS — E7800 Pure hypercholesterolemia, unspecified: Secondary | ICD-10-CM | POA: Diagnosis not present

## 2020-07-02 DIAGNOSIS — E559 Vitamin D deficiency, unspecified: Secondary | ICD-10-CM | POA: Diagnosis not present

## 2020-07-02 DIAGNOSIS — K589 Irritable bowel syndrome without diarrhea: Secondary | ICD-10-CM | POA: Diagnosis not present

## 2020-07-06 ENCOUNTER — Ambulatory Visit: Payer: PPO | Admitting: Podiatry

## 2020-07-06 ENCOUNTER — Other Ambulatory Visit: Payer: Self-pay

## 2020-07-06 ENCOUNTER — Encounter: Payer: Self-pay | Admitting: Podiatry

## 2020-07-06 DIAGNOSIS — Q828 Other specified congenital malformations of skin: Secondary | ICD-10-CM | POA: Diagnosis not present

## 2020-07-06 DIAGNOSIS — M7741 Metatarsalgia, right foot: Secondary | ICD-10-CM

## 2020-07-06 DIAGNOSIS — M7742 Metatarsalgia, left foot: Secondary | ICD-10-CM

## 2020-07-06 DIAGNOSIS — M21619 Bunion of unspecified foot: Secondary | ICD-10-CM

## 2020-07-06 MED ORDER — DICLOFENAC SODIUM 1 % EX GEL: 2.0000 g | Freq: Four times a day (QID) | CUTANEOUS | 0 refills | Status: DC

## 2020-07-06 NOTE — Patient Instructions (Addendum)

## 2020-07-08 NOTE — Progress Notes (Signed)
Subjective:   Patient ID: Brittany Douglas, female   DOB: 79 y.o.   MRN: [id number]   HPI 79 year old female presents the office today for concerns of bilateral foot pain which has been a chronic issue.  She is previously seen Dr. Berline Lopes several years ago for the same issue and she was told to take 2 aspirins a day when it hurt but she did not want to do this.  When asked monitor how it feels or where it hurts she states is difficult to answer the question but she states that at times it hurts in the bend of the toes and pointing to the bottom. No recent injury.  No swelling.  She also has a "corn" on the right big toe.  Denies any recent injury.  No swelling.   Review of Systems  All other systems reviewed and are negative.  Past Medical History:  Diagnosis Date  . Anxiety   . Aortic atherosclerosis ([place])   . Avascular necrosis of femur, right ([place])   . Balance problems   . Chronic kidney disease, stage II (mild)   . DDD (degenerative disc disease), lumbosacral   . Depression   . DJD (degenerative joint disease), lumbar   . Elevated cholesterol   . GERD (gastroesophageal reflux disease)   . HOH (hard of hearing)   . Hypertension   . Hypertensive renal disease, benign   . Hypokalemia   . Hyponatremia   . Hypothyroidism   . IBS (irritable bowel syndrome)   . Major depression, recurrent ([place])   . Osteopenia   . Post-menopausal   . Reflux   . Subclinical hypothyroidism   . Varicose vein of leg   . Vitamin D deficiency     Past Surgical History:  Procedure Laterality Date  . BREAST LUMPECTOMY Bilateral   . BREAST SURGERY     Benign breast lumps  . CATARACT EXTRACTION    . CHOLECYSTECTOMY    . FOOT SURGERY    . VAGINAL HYSTERECTOMY     A & P repair     Current Outpatient Medications:  .  amLODipine (NORVASC) 5 MG tablet, Take 1 tablet (5 mg total) by mouth 2 (two) times daily., Disp: 180 tablet, Rfl: 3 .  aspirin 81 MG tablet, Take 81 mg by mouth daily., Disp: ,  Rfl:  .  Bee Pollen 550 MG CAPS, Take 2 capsules by mouth., Disp: , Rfl:  .  Cetirizine HCl (ZYRTEC PO), Take by mouth., Disp: , Rfl:  .  Cholecalciferol (VITAMIN D PO), Take by mouth., Disp: , Rfl:  .  Coenzyme Q10 (COQ10) 100 MG CAPS, Take by mouth., Disp: , Rfl:  .  conjugated estrogens (PREMARIN) vaginal cream, INSERT APPLICATORFUL VAGINALLY 2 TIMES A WEEK AS DIRECTED, Disp: 30 g, Rfl: 2 .  diclofenac Sodium (VOLTAREN) 1 % GEL, Apply 2 g topically 4 (four) times daily. Rub into affected area of foot 2 to 4 times daily, Disp: 100 g, Rfl: 0 .  Flaxseed, Linseed, (FLAX SEEDS) POWD, Take by mouth., Disp: , Rfl:  .  losartan (COZAAR) 100 MG tablet, Take 100 mg by mouth daily., Disp: , Rfl:  .  Multiple Vitamin (MULTIVITAMIN) tablet, Take 1 tablet by mouth daily., Disp: , Rfl:  .  Omega-3 Fatty Acids (FISH OIL) 1200 MG CAPS, Take by mouth., Disp: , Rfl:  .  Polyethylene Glycol 3350 (MIRALAX PO), Take by mouth., Disp: , Rfl:  .  rosuvastatin (CRESTOR) 5 MG tablet, Take 5 mg by  mouth daily., Disp: , Rfl:  .  sertraline (ZOLOFT) 100 MG tablet, Take 200 mg by mouth daily., Disp: , Rfl:   Allergies  Allergen Reactions  . Ace Inhibitors   . Amlodipine   . Codeine Nausea And Vomiting  . Cymbalta [Duloxetine Hcl]   . Doxycycline   . Hctz [Hydrochlorothiazide]   . Penicillins Hives  . Sulfa Antibiotics Rash          Objective:  Physical Exam  General: AAO x3, NAD  Dermatological: Hyperkeratotic lesion right plantar hallux.  No underlying ulceration drainage or signs of infection.  Extremities slight hyperkeratotic lesion left fifth metatarsal head.  There is no open lesions.  Vascular: Dorsalis Pedis artery and Posterior Tibial artery pedal pulses are 2/4 bilateral with immedate capillary fill time. There is no pain with calf compression, swelling, warmth, erythema.   Neruologic: Grossly intact via light touch bilateral.  Sensation appears to be intact with Thornell Mule  monofilament  Musculoskeletal: Prominence of metatarsal heads plantarly with atrophy of the fat pad.  Hallux abductus is present turned overlapping second toe resulting in the prominence along the lateral hallux IPJ.  Muscular strength 5/5 in all groups tested bilateral.  Gait: Unassisted, Nonantalgic.       Assessment:   Hyperkeratotic lesion, metatarsalgia; bunion    Plan:  -Treatment options discussed including all alternatives, risks, and complications -Etiology of symptoms were discussed -Difficulty with a full history from her but it seems that most of her symptoms are coming from the prominent metatarsal heads, metatarsalgia.  I dispensed gel metatarsal pads for her.  Also dispensed a toe separator between her big toe and second toe.  She will be debrided hyperkeratotic tissue with any complications or bleeding.  Recommend moisturizer to these areas daily.  Discussed shoe modifications and orthotics but she has a pair shoes that she wears that are very comfortable.  She can use Voltaren gel as needed submetatarsal area.   No follow-ups on file.  Trula Slade DPM

## 2020-07-12 ENCOUNTER — Ambulatory Visit
Admission: RE | Admit: 2020-07-12 | Discharge: 2020-07-12 | Disposition: A | Discharge status: Home / Self Care | Payer: PPO | Source: Ambulatory Visit | Attending: Family Medicine | Admitting: Family Medicine

## 2020-07-12 ENCOUNTER — Other Ambulatory Visit: Payer: Self-pay

## 2020-07-12 DIAGNOSIS — Z1231 Encounter for screening mammogram for malignant neoplasm of breast: Secondary | ICD-10-CM | POA: Diagnosis not present

## 2020-07-20 DIAGNOSIS — H903 Sensorineural hearing loss, bilateral: Secondary | ICD-10-CM | POA: Diagnosis not present

## 2020-08-03 DIAGNOSIS — L570 Actinic keratosis: Secondary | ICD-10-CM | POA: Diagnosis not present

## 2020-08-03 DIAGNOSIS — L820 Inflamed seborrheic keratosis: Secondary | ICD-10-CM | POA: Diagnosis not present

## 2020-08-03 DIAGNOSIS — D225 Melanocytic nevi of trunk: Secondary | ICD-10-CM | POA: Diagnosis not present

## 2020-08-03 DIAGNOSIS — L578 Other skin changes due to chronic exposure to nonionizing radiation: Secondary | ICD-10-CM | POA: Diagnosis not present

## 2020-08-03 DIAGNOSIS — L304 Erythema intertrigo: Secondary | ICD-10-CM | POA: Diagnosis not present

## 2020-08-03 DIAGNOSIS — L821 Other seborrheic keratosis: Secondary | ICD-10-CM | POA: Diagnosis not present

## 2020-09-18 ENCOUNTER — Other Ambulatory Visit: Payer: Self-pay | Admitting: Interventional Cardiology

## 2020-09-18 DIAGNOSIS — Z961 Presence of intraocular lens: Secondary | ICD-10-CM | POA: Diagnosis not present

## 2020-09-18 DIAGNOSIS — I10 Essential (primary) hypertension: Secondary | ICD-10-CM

## 2020-09-18 DIAGNOSIS — H524 Presbyopia: Secondary | ICD-10-CM | POA: Diagnosis not present

## 2020-09-18 DIAGNOSIS — H04123 Dry eye syndrome of bilateral lacrimal glands: Secondary | ICD-10-CM | POA: Diagnosis not present

## 2020-09-20 DIAGNOSIS — E039 Hypothyroidism, unspecified: Secondary | ICD-10-CM | POA: Diagnosis not present

## 2020-09-24 ENCOUNTER — Telehealth: Payer: Self-pay | Admitting: Interventional Cardiology

## 2020-09-24 NOTE — Telephone Encounter (Signed)
Called and spoke to patient. She states that she received a message from her pharmacy that she needed an appointment with Dr. Irish Lack in order to get additional refills on her amlodipine. We saw the patient last in 09/2019 and recommended that she follow up AS NEEDED. She denies having an cardiac complaints at this time. She states that her PCP has been filling her amlodipine and does not need an Rx sent in by cardiology.

## 2020-09-24 NOTE — Telephone Encounter (Signed)
New Message:     Pt said she received  A call from the Pharmacist saying she need an appt with Dr Irish Lack so she can get her Amlodipine. Pt says he does not get her Amlodipine from Dr Irish Lack, her primary doctor takes care of it.

## 2020-12-28 DIAGNOSIS — E039 Hypothyroidism, unspecified: Secondary | ICD-10-CM | POA: Diagnosis not present

## 2020-12-28 DIAGNOSIS — Z79899 Other long term (current) drug therapy: Secondary | ICD-10-CM | POA: Diagnosis not present

## 2020-12-28 DIAGNOSIS — E7800 Pure hypercholesterolemia, unspecified: Secondary | ICD-10-CM | POA: Diagnosis not present

## 2020-12-28 DIAGNOSIS — R946 Abnormal results of thyroid function studies: Secondary | ICD-10-CM | POA: Diagnosis not present

## 2020-12-29 DIAGNOSIS — M545 Low back pain, unspecified: Secondary | ICD-10-CM | POA: Diagnosis not present

## 2021-01-03 DIAGNOSIS — M5136 Other intervertebral disc degeneration, lumbar region: Secondary | ICD-10-CM | POA: Diagnosis not present

## 2021-01-03 DIAGNOSIS — I10 Essential (primary) hypertension: Secondary | ICD-10-CM | POA: Diagnosis not present

## 2021-01-03 DIAGNOSIS — E039 Hypothyroidism, unspecified: Secondary | ICD-10-CM | POA: Diagnosis not present

## 2021-01-03 DIAGNOSIS — Z79899 Other long term (current) drug therapy: Secondary | ICD-10-CM | POA: Diagnosis not present

## 2021-01-03 DIAGNOSIS — E7800 Pure hypercholesterolemia, unspecified: Secondary | ICD-10-CM | POA: Diagnosis not present

## 2021-01-09 DIAGNOSIS — M5459 Other low back pain: Secondary | ICD-10-CM | POA: Diagnosis not present

## 2021-01-29 ENCOUNTER — Telehealth: Payer: Self-pay | Admitting: *Deleted

## 2021-01-29 MED ORDER — PREMARIN 0.625 MG/GM VA CREA: TOPICAL_CREAM | VAGINAL | 3 refills | Status: DC

## 2021-01-29 NOTE — Telephone Encounter (Signed)
Patient called requesting refill on premarin vaginal cream, had annual exam on 06/28/20. Okay refill until July annual exam?

## 2021-01-29 NOTE — Telephone Encounter (Signed)
Yes that is fine.  Thank you.

## 2021-01-29 NOTE — Telephone Encounter (Signed)
Patient informed. Rx sent 

## 2021-01-30 DIAGNOSIS — M4316 Spondylolisthesis, lumbar region: Secondary | ICD-10-CM | POA: Diagnosis not present

## 2021-02-06 DIAGNOSIS — M5136 Other intervertebral disc degeneration, lumbar region: Secondary | ICD-10-CM | POA: Diagnosis not present

## 2021-02-06 DIAGNOSIS — M4316 Spondylolisthesis, lumbar region: Secondary | ICD-10-CM | POA: Diagnosis not present

## 2021-02-06 DIAGNOSIS — M4856XD Collapsed vertebra, not elsewhere classified, lumbar region, subsequent encounter for fracture with routine healing: Secondary | ICD-10-CM | POA: Diagnosis not present

## 2021-03-07 DIAGNOSIS — E039 Hypothyroidism, unspecified: Secondary | ICD-10-CM | POA: Diagnosis not present

## 2021-03-13 DIAGNOSIS — M5416 Radiculopathy, lumbar region: Secondary | ICD-10-CM | POA: Diagnosis not present

## 2021-03-13 DIAGNOSIS — M4856XD Collapsed vertebra, not elsewhere classified, lumbar region, subsequent encounter for fracture with routine healing: Secondary | ICD-10-CM | POA: Diagnosis not present

## 2021-03-13 DIAGNOSIS — M5136 Other intervertebral disc degeneration, lumbar region: Secondary | ICD-10-CM | POA: Diagnosis not present

## 2021-03-13 DIAGNOSIS — M4316 Spondylolisthesis, lumbar region: Secondary | ICD-10-CM | POA: Diagnosis not present

## 2021-03-26 DIAGNOSIS — M5416 Radiculopathy, lumbar region: Secondary | ICD-10-CM | POA: Diagnosis not present

## 2021-04-10 DIAGNOSIS — M7742 Metatarsalgia, left foot: Secondary | ICD-10-CM | POA: Diagnosis not present

## 2021-04-10 DIAGNOSIS — M7741 Metatarsalgia, right foot: Secondary | ICD-10-CM | POA: Diagnosis not present

## 2021-05-01 DIAGNOSIS — M47896 Other spondylosis, lumbar region: Secondary | ICD-10-CM | POA: Diagnosis not present

## 2021-05-01 DIAGNOSIS — M47816 Spondylosis without myelopathy or radiculopathy, lumbar region: Secondary | ICD-10-CM | POA: Diagnosis not present

## 2021-05-01 DIAGNOSIS — M5136 Other intervertebral disc degeneration, lumbar region: Secondary | ICD-10-CM | POA: Diagnosis not present

## 2021-06-03 DIAGNOSIS — G609 Hereditary and idiopathic neuropathy, unspecified: Secondary | ICD-10-CM | POA: Diagnosis not present

## 2021-06-03 DIAGNOSIS — M79672 Pain in left foot: Secondary | ICD-10-CM | POA: Diagnosis not present

## 2021-06-03 DIAGNOSIS — M7742 Metatarsalgia, left foot: Secondary | ICD-10-CM | POA: Diagnosis not present

## 2021-06-03 DIAGNOSIS — M2042 Other hammer toe(s) (acquired), left foot: Secondary | ICD-10-CM | POA: Diagnosis not present

## 2021-06-03 DIAGNOSIS — M79671 Pain in right foot: Secondary | ICD-10-CM | POA: Diagnosis not present

## 2021-06-04 DIAGNOSIS — M47816 Spondylosis without myelopathy or radiculopathy, lumbar region: Secondary | ICD-10-CM | POA: Diagnosis not present

## 2021-06-06 ENCOUNTER — Other Ambulatory Visit: Payer: Self-pay | Admitting: Family Medicine

## 2021-06-06 DIAGNOSIS — Z1231 Encounter for screening mammogram for malignant neoplasm of breast: Secondary | ICD-10-CM

## 2021-07-02 ENCOUNTER — Other Ambulatory Visit: Payer: Self-pay

## 2021-07-02 ENCOUNTER — Ambulatory Visit (INDEPENDENT_AMBULATORY_CARE_PROVIDER_SITE_OTHER): Payer: PPO | Admitting: Obstetrics & Gynecology

## 2021-07-02 ENCOUNTER — Encounter: Payer: Self-pay | Admitting: Obstetrics & Gynecology

## 2021-07-02 VITALS — BP 112/60 | HR 64 | Resp 16 | Ht 61.0 in | Wt 130.0 lb

## 2021-07-02 DIAGNOSIS — Z90710 Acquired absence of both cervix and uterus: Secondary | ICD-10-CM

## 2021-07-02 DIAGNOSIS — Z01419 Encounter for gynecological examination (general) (routine) without abnormal findings: Secondary | ICD-10-CM | POA: Diagnosis not present

## 2021-07-02 DIAGNOSIS — M810 Age-related osteoporosis without current pathological fracture: Secondary | ICD-10-CM

## 2021-07-02 MED ORDER — PREMARIN 0.625 MG/GM VA CREA: TOPICAL_CREAM | VAGINAL | 3 refills | Status: DC

## 2021-07-02 NOTE — Progress Notes (Signed)
Brittany Douglas 24-Nov-1941 [id number]   History:    80 y.o. G2P2L2 Married  RP:  Established patient presenting for annual gyn exam   HPI: Had a TVH in 1972.  No pelvic pain.  Using premarin for vaginal dryness/atrophy.  Breasts normal.  Osteoporosis on Fosamax managed by PCP.  BMI 24.56. Stationary bike and some exercises.    Past medical history,surgical history, family history and social history were all reviewed and documented in the EPIC chart.  Gynecologic History No LMP recorded. Patient has had a hysterectomy.  Obstetric History OB History  Gravida Para Term Preterm AB Living  2 2 2     2   SAB IAB Ectopic Multiple Live Births               # Outcome Date GA Lbr Len/2nd Weight Sex Delivery Anes PTL Lv  2 Term           1 Term              ROS: A ROS was performed and pertinent positives and negatives are included in the history.  GENERAL: No fevers or chills. HEENT: No change in vision, no earache, sore throat or sinus congestion. NECK: No pain or stiffness. CARDIOVASCULAR: No chest pain or pressure. No palpitations. PULMONARY: No shortness of breath, cough or wheeze. GASTROINTESTINAL: No abdominal pain, nausea, vomiting or diarrhea, melena or bright red blood per rectum. GENITOURINARY: No urinary frequency, urgency, hesitancy or dysuria. MUSCULOSKELETAL: No joint or muscle pain, no back pain, no recent trauma. DERMATOLOGIC: No rash, no itching, no lesions. ENDOCRINE: No polyuria, polydipsia, no heat or cold intolerance. No recent change in weight. HEMATOLOGICAL: No anemia or easy bruising or bleeding. NEUROLOGIC: No headache, seizures, numbness, tingling or weakness. PSYCHIATRIC: No depression, no loss of interest in normal activity or change in sleep pattern.     Exam:   BP 112/60   Pulse 64   Resp 16   Ht 5\' 1"  (1.549 m)   Wt 130 lb (59 kg)   BMI 24.56 kg/m   Body mass index is 24.56 kg/m.  General appearance : Well developed well nourished female. No  acute distress HEENT: Eyes: no retinal hemorrhage or exudates,  Neck supple, trachea midline, no carotid bruits, no thyroidmegaly Lungs: Clear to auscultation, no rhonchi or wheezes, or rib retractions  Heart: Regular rate and rhythm, no murmurs or gallops Breast:Examined in sitting and supine position were symmetrical in appearance, no palpable masses or tenderness,  no skin retraction, no nipple inversion, no nipple discharge, no skin discoloration, no axillary or supraclavicular lymphadenopathy Abdomen: no palpable masses or tenderness, no rebound or guarding Extremities: no edema or skin discoloration or tenderness  Pelvic: Vulva: Normal             Vagina: No gross lesions or discharge  Cervix/Uterus absent  Adnexa  Without masses or tenderness  Anus: Normal   Assessment/Plan:  80 y.o. female for annual exam   1. Well female exam with routine gynecological exam Gynecologic exam s/p Vaginal Hysterectomy.  No indication to repeat a Pap test at this time.  Breast exam normal.  Screening mammo Neg 06/2020, will schedule for this year.  [place] 2015.  Heatlh labs with Dr Stephanie Acre.  BMI 24.56.  Continue with fitness and healthy nutrition.  2. S/P vaginal hysterectomy  3. Age-related osteoporosis without current pathological fracture Well on Fosamax.  Vit D supplements, Ca++ 1.5 g/d total, regular weight bearing physical activities.  Will repeat  a BD 06/2022.  Other orders - cetirizine (ZYRTEC) 10 MG tablet; Take by mouth. - alendronate (FOSAMAX) 70 MG tablet; Take 70 mg by mouth once a week. Take with a full glass of water on an empty stomach. - diazepam (VALIUM) 2 MG tablet; Take 2 mg by mouth every 6 (six) hours as needed for anxiety. - CALCIUM PO; Take by mouth. - conjugated estrogens (PREMARIN) vaginal cream; INSERT 1/4 APPLICATOR VAGINALLY 2 TIMES A WEEK AS DIRECTED   Princess Bruins MD, 11:13 AM 07/02/2021

## 2021-07-05 DIAGNOSIS — H903 Sensorineural hearing loss, bilateral: Secondary | ICD-10-CM | POA: Diagnosis not present

## 2021-07-08 DIAGNOSIS — G609 Hereditary and idiopathic neuropathy, unspecified: Secondary | ICD-10-CM | POA: Diagnosis not present

## 2021-07-12 DIAGNOSIS — M47816 Spondylosis without myelopathy or radiculopathy, lumbar region: Secondary | ICD-10-CM | POA: Diagnosis not present

## 2021-07-12 DIAGNOSIS — G609 Hereditary and idiopathic neuropathy, unspecified: Secondary | ICD-10-CM | POA: Diagnosis not present

## 2021-07-12 DIAGNOSIS — M5136 Other intervertebral disc degeneration, lumbar region: Secondary | ICD-10-CM | POA: Diagnosis not present

## 2021-07-26 DIAGNOSIS — E039 Hypothyroidism, unspecified: Secondary | ICD-10-CM | POA: Diagnosis not present

## 2021-07-26 DIAGNOSIS — E559 Vitamin D deficiency, unspecified: Secondary | ICD-10-CM | POA: Diagnosis not present

## 2021-07-26 DIAGNOSIS — Z79899 Other long term (current) drug therapy: Secondary | ICD-10-CM | POA: Diagnosis not present

## 2021-07-26 DIAGNOSIS — E7800 Pure hypercholesterolemia, unspecified: Secondary | ICD-10-CM | POA: Diagnosis not present

## 2021-07-30 DIAGNOSIS — E7800 Pure hypercholesterolemia, unspecified: Secondary | ICD-10-CM | POA: Diagnosis not present

## 2021-07-30 DIAGNOSIS — F419 Anxiety disorder, unspecified: Secondary | ICD-10-CM | POA: Diagnosis not present

## 2021-07-30 DIAGNOSIS — Z79899 Other long term (current) drug therapy: Secondary | ICD-10-CM | POA: Diagnosis not present

## 2021-07-30 DIAGNOSIS — E559 Vitamin D deficiency, unspecified: Secondary | ICD-10-CM | POA: Diagnosis not present

## 2021-07-30 DIAGNOSIS — M87051 Idiopathic aseptic necrosis of right femur: Secondary | ICD-10-CM | POA: Diagnosis not present

## 2021-07-30 DIAGNOSIS — K219 Gastro-esophageal reflux disease without esophagitis: Secondary | ICD-10-CM | POA: Diagnosis not present

## 2021-07-30 DIAGNOSIS — F339 Major depressive disorder, recurrent, unspecified: Secondary | ICD-10-CM | POA: Diagnosis not present

## 2021-07-30 DIAGNOSIS — N182 Chronic kidney disease, stage 2 (mild): Secondary | ICD-10-CM | POA: Diagnosis not present

## 2021-07-30 DIAGNOSIS — I10 Essential (primary) hypertension: Secondary | ICD-10-CM | POA: Diagnosis not present

## 2021-07-30 DIAGNOSIS — Z0000 Encounter for general adult medical examination without abnormal findings: Secondary | ICD-10-CM | POA: Diagnosis not present

## 2021-07-30 DIAGNOSIS — E039 Hypothyroidism, unspecified: Secondary | ICD-10-CM | POA: Diagnosis not present

## 2021-07-30 DIAGNOSIS — K589 Irritable bowel syndrome without diarrhea: Secondary | ICD-10-CM | POA: Diagnosis not present

## 2021-08-01 ENCOUNTER — Other Ambulatory Visit: Payer: Self-pay

## 2021-08-01 ENCOUNTER — Ambulatory Visit
Admission: RE | Admit: 2021-08-01 | Discharge: 2021-08-01 | Disposition: A | Discharge status: Home / Self Care | Payer: PPO | Source: Ambulatory Visit | Attending: Family Medicine | Admitting: Family Medicine

## 2021-08-01 DIAGNOSIS — Z1231 Encounter for screening mammogram for malignant neoplasm of breast: Secondary | ICD-10-CM | POA: Diagnosis not present

## 2021-08-06 DIAGNOSIS — L219 Seborrheic dermatitis, unspecified: Secondary | ICD-10-CM | POA: Diagnosis not present

## 2021-08-06 DIAGNOSIS — D485 Neoplasm of uncertain behavior of skin: Secondary | ICD-10-CM | POA: Diagnosis not present

## 2021-08-06 DIAGNOSIS — L821 Other seborrheic keratosis: Secondary | ICD-10-CM | POA: Diagnosis not present

## 2021-08-06 DIAGNOSIS — D2271 Melanocytic nevi of right lower limb, including hip: Secondary | ICD-10-CM | POA: Diagnosis not present

## 2021-08-06 DIAGNOSIS — L570 Actinic keratosis: Secondary | ICD-10-CM | POA: Diagnosis not present

## 2021-08-06 DIAGNOSIS — L820 Inflamed seborrheic keratosis: Secondary | ICD-10-CM | POA: Diagnosis not present

## 2021-08-06 DIAGNOSIS — L304 Erythema intertrigo: Secondary | ICD-10-CM | POA: Diagnosis not present

## 2021-08-06 DIAGNOSIS — L578 Other skin changes due to chronic exposure to nonionizing radiation: Secondary | ICD-10-CM | POA: Diagnosis not present

## 2021-08-06 DIAGNOSIS — L719 Rosacea, unspecified: Secondary | ICD-10-CM | POA: Diagnosis not present

## 2021-08-06 DIAGNOSIS — C44719 Basal cell carcinoma of skin of left lower limb, including hip: Secondary | ICD-10-CM | POA: Diagnosis not present

## 2021-08-06 DIAGNOSIS — D225 Melanocytic nevi of trunk: Secondary | ICD-10-CM | POA: Diagnosis not present

## 2021-09-19 DIAGNOSIS — H5213 Myopia, bilateral: Secondary | ICD-10-CM | POA: Diagnosis not present

## 2021-09-19 DIAGNOSIS — H04123 Dry eye syndrome of bilateral lacrimal glands: Secondary | ICD-10-CM | POA: Diagnosis not present

## 2021-09-19 DIAGNOSIS — H43813 Vitreous degeneration, bilateral: Secondary | ICD-10-CM | POA: Diagnosis not present

## 2021-09-19 DIAGNOSIS — Z961 Presence of intraocular lens: Secondary | ICD-10-CM | POA: Diagnosis not present

## 2021-09-24 DIAGNOSIS — M1712 Unilateral primary osteoarthritis, left knee: Secondary | ICD-10-CM | POA: Diagnosis not present

## 2021-09-24 DIAGNOSIS — M1711 Unilateral primary osteoarthritis, right knee: Secondary | ICD-10-CM | POA: Diagnosis not present

## 2021-10-04 DIAGNOSIS — M25561 Pain in right knee: Secondary | ICD-10-CM | POA: Diagnosis not present

## 2021-10-08 DIAGNOSIS — M25561 Pain in right knee: Secondary | ICD-10-CM | POA: Diagnosis not present

## 2021-10-16 DIAGNOSIS — M25561 Pain in right knee: Secondary | ICD-10-CM | POA: Diagnosis not present

## 2021-10-18 DIAGNOSIS — M25512 Pain in left shoulder: Secondary | ICD-10-CM | POA: Diagnosis not present

## 2021-10-18 DIAGNOSIS — Z23 Encounter for immunization: Secondary | ICD-10-CM | POA: Diagnosis not present

## 2021-10-18 DIAGNOSIS — L578 Other skin changes due to chronic exposure to nonionizing radiation: Secondary | ICD-10-CM | POA: Diagnosis not present

## 2021-10-18 DIAGNOSIS — C44719 Basal cell carcinoma of skin of left lower limb, including hip: Secondary | ICD-10-CM | POA: Diagnosis not present

## 2021-10-18 DIAGNOSIS — L570 Actinic keratosis: Secondary | ICD-10-CM | POA: Diagnosis not present

## 2022-01-13 DIAGNOSIS — H6123 Impacted cerumen, bilateral: Secondary | ICD-10-CM | POA: Diagnosis not present

## 2022-01-30 DIAGNOSIS — R351 Nocturia: Secondary | ICD-10-CM | POA: Diagnosis not present

## 2022-01-30 DIAGNOSIS — Z79899 Other long term (current) drug therapy: Secondary | ICD-10-CM | POA: Diagnosis not present

## 2022-01-30 DIAGNOSIS — F339 Major depressive disorder, recurrent, unspecified: Secondary | ICD-10-CM | POA: Diagnosis not present

## 2022-01-30 DIAGNOSIS — E039 Hypothyroidism, unspecified: Secondary | ICD-10-CM | POA: Diagnosis not present

## 2022-03-13 DIAGNOSIS — E039 Hypothyroidism, unspecified: Secondary | ICD-10-CM | POA: Diagnosis not present

## 2022-03-17 DIAGNOSIS — M25551 Pain in right hip: Secondary | ICD-10-CM | POA: Diagnosis not present

## 2022-04-07 DIAGNOSIS — H6121 Impacted cerumen, right ear: Secondary | ICD-10-CM | POA: Diagnosis not present

## 2022-04-07 DIAGNOSIS — H6982 Other specified disorders of Eustachian tube, left ear: Secondary | ICD-10-CM | POA: Diagnosis not present

## 2022-04-24 DIAGNOSIS — E039 Hypothyroidism, unspecified: Secondary | ICD-10-CM | POA: Diagnosis not present

## 2022-04-25 ENCOUNTER — Other Ambulatory Visit: Payer: Self-pay | Admitting: Family Medicine

## 2022-04-25 DIAGNOSIS — Z1231 Encounter for screening mammogram for malignant neoplasm of breast: Secondary | ICD-10-CM

## 2022-04-28 DIAGNOSIS — M8588 Other specified disorders of bone density and structure, other site: Secondary | ICD-10-CM | POA: Diagnosis not present

## 2022-04-28 DIAGNOSIS — E039 Hypothyroidism, unspecified: Secondary | ICD-10-CM | POA: Diagnosis not present

## 2022-04-28 DIAGNOSIS — H65192 Other acute nonsuppurative otitis media, left ear: Secondary | ICD-10-CM | POA: Diagnosis not present

## 2022-05-02 DIAGNOSIS — C44722 Squamous cell carcinoma of skin of right lower limb, including hip: Secondary | ICD-10-CM | POA: Diagnosis not present

## 2022-05-02 DIAGNOSIS — L821 Other seborrheic keratosis: Secondary | ICD-10-CM | POA: Diagnosis not present

## 2022-05-02 DIAGNOSIS — L570 Actinic keratosis: Secondary | ICD-10-CM | POA: Diagnosis not present

## 2022-05-02 DIAGNOSIS — D485 Neoplasm of uncertain behavior of skin: Secondary | ICD-10-CM | POA: Diagnosis not present

## 2022-05-05 ENCOUNTER — Other Ambulatory Visit: Payer: Self-pay | Admitting: Family Medicine

## 2022-05-05 DIAGNOSIS — E2839 Other primary ovarian failure: Secondary | ICD-10-CM

## 2022-05-15 DIAGNOSIS — M7741 Metatarsalgia, right foot: Secondary | ICD-10-CM | POA: Diagnosis not present

## 2022-05-15 DIAGNOSIS — M7742 Metatarsalgia, left foot: Secondary | ICD-10-CM | POA: Diagnosis not present

## 2022-05-15 DIAGNOSIS — M79673 Pain in unspecified foot: Secondary | ICD-10-CM | POA: Diagnosis not present

## 2022-05-27 DIAGNOSIS — C44722 Squamous cell carcinoma of skin of right lower limb, including hip: Secondary | ICD-10-CM | POA: Diagnosis not present

## 2022-05-27 DIAGNOSIS — I872 Venous insufficiency (chronic) (peripheral): Secondary | ICD-10-CM | POA: Diagnosis not present

## 2022-06-21 ENCOUNTER — Emergency Department (HOSPITAL_BASED_OUTPATIENT_CLINIC_OR_DEPARTMENT_OTHER)
Admission: EM | Admit: 2022-06-21 | Discharge: 2022-06-21 | Disposition: A | Discharge status: Home / Self Care | Payer: PPO | Attending: Emergency Medicine | Admitting: Emergency Medicine

## 2022-06-21 ENCOUNTER — Other Ambulatory Visit: Payer: Self-pay

## 2022-06-21 ENCOUNTER — Emergency Department (HOSPITAL_BASED_OUTPATIENT_CLINIC_OR_DEPARTMENT_OTHER): Payer: PPO

## 2022-06-21 DIAGNOSIS — S022XXA Fracture of nasal bones, initial encounter for closed fracture: Secondary | ICD-10-CM | POA: Insufficient documentation

## 2022-06-21 DIAGNOSIS — I129 Hypertensive chronic kidney disease with stage 1 through stage 4 chronic kidney disease, or unspecified chronic kidney disease: Secondary | ICD-10-CM | POA: Insufficient documentation

## 2022-06-21 DIAGNOSIS — W01198A Fall on same level from slipping, tripping and stumbling with subsequent striking against other object, initial encounter: Secondary | ICD-10-CM | POA: Insufficient documentation

## 2022-06-21 DIAGNOSIS — Z79899 Other long term (current) drug therapy: Secondary | ICD-10-CM | POA: Insufficient documentation

## 2022-06-21 DIAGNOSIS — N189 Chronic kidney disease, unspecified: Secondary | ICD-10-CM | POA: Diagnosis not present

## 2022-06-21 DIAGNOSIS — Z7982 Long term (current) use of aspirin: Secondary | ICD-10-CM | POA: Diagnosis not present

## 2022-06-21 DIAGNOSIS — S0992XA Unspecified injury of nose, initial encounter: Secondary | ICD-10-CM | POA: Diagnosis present

## 2022-06-21 DIAGNOSIS — S0083XA Contusion of other part of head, initial encounter: Secondary | ICD-10-CM | POA: Diagnosis not present

## 2022-06-21 MED ORDER — CEPHALEXIN 500 MG PO CAPS: 500.0000 mg | ORAL_CAPSULE | Freq: Three times a day (TID) | ORAL | 0 refills | Status: DC

## 2022-06-21 NOTE — ED Triage Notes (Signed)
Pt POV c/o mechanical fall last night appx 2230. Pt fell forward and hit face on hard wood floor. Denies blood thinners. Pt cognitively at baseline, per daughter. C/o headache at time of triage.   Pt c/o mild pain to bridge of nose. Took 2 tylenol PTA.   Pt arrives with bilateral bruising to orbital area.

## 2022-06-21 NOTE — ED Provider Notes (Signed)
[facility] EMERGENCY DEPT Provider Note   CSN: [id number] Arrival date & time: 06/21/22  1357     History  Chief Complaint  Patient presents with   Fall   Head Injury    Brittany Douglas is a 81 y.o. female.   Fall  Head Injury  Patient is an 81 year old female with past medical history significant for HTN, anxiety, osteopenia, balance issues, hard of hearing, CKD, reflux  Patient is presented emergency room today with her daughter who is a Designer, jewellery.  It seems that patient had a mechanical fall yesterday evening around 10:30 PM when she attempted to reach down and grab something off the ground and over balance and fell forwards smacking her face on the ground which is a hardwood floor.  No loss of consciousness no nausea vomiting confusion.  Per daughter patient is at baseline mentation and also normal behavior.   She does endorse having some bruising to her face but states that she can breathe well through both nostrils  She is not on any blood thinners.      Home Medications Prior to Admission medications   Medication Sig Start Date End Date Taking? Authorizing Provider  alendronate (FOSAMAX) 70 MG tablet Take 70 mg by mouth once a week. Take with a full glass of water on an empty stomach.    [provider]  amLODipine (NORVASC) 5 MG tablet Take 5 mg by mouth in the morning and at bedtime.    [provider]  aspirin 81 MG tablet Take 81 mg by mouth daily.    [provider]  CALCIUM PO Take by mouth.    [provider]  cetirizine (ZYRTEC) 10 MG tablet Take by mouth.    [provider]  Cholecalciferol (VITAMIN D PO) Take 2,000 Int'l Units by mouth.    [provider]  Coenzyme Q10 (COQ10) 100 MG CAPS Take by mouth.    [provider]  conjugated estrogens (PREMARIN) vaginal cream INSERT 1/4 APPLICATOR VAGINALLY 2 TIMES A WEEK AS DIRECTED 07/02/21   Princess Bruins, MD   diazepam (VALIUM) 2 MG tablet Take 2 mg by mouth every 6 (six) hours as needed for anxiety.    [provider]  losartan (COZAAR) 100 MG tablet Take 100 mg by mouth daily.    [provider]  Multiple Vitamin (MULTIVITAMIN) tablet Take 1 tablet by mouth daily.    [provider]  Omega-3 Fatty Acids (FISH OIL) 1200 MG CAPS Take by mouth.    [provider]  Polyethylene Glycol 3350 (MIRALAX PO) Take by mouth.    [provider]  rosuvastatin (CRESTOR) 5 MG tablet Take 5 mg by mouth daily.    [provider]  sertraline (ZOLOFT) 100 MG tablet Take 200 mg by mouth daily.    [provider]      Allergies    Nitrofurantoin, Ace inhibitors, Amlodipine, Codeine, Cymbalta [duloxetine hcl], Doxycycline, Hctz [hydrochlorothiazide], Lisinopril, Penicillins, and Sulfa antibiotics    Review of Systems   Review of Systems  Physical Exam Updated Vital Signs BP 138/64   Pulse (!) 59   Temp 97.9 F (36.6 C) (Oral)   Resp 18   SpO2 96%  Physical Exam Vitals and nursing note reviewed.  Constitutional:      General: She is not in acute distress. HENT:     Head: Normocephalic.     Comments:  Ecchymoses under each eye  EOMI, no evidence of entrapment.  No  nasal hematoma/septal hematoma    Nose: Nose normal.  Eyes:     General: No scleral icterus. Cardiovascular:     Rate and Rhythm: Normal rate and regular rhythm.     Pulses: Normal pulses.     Heart sounds: Normal heart sounds.  Pulmonary:     Effort: Pulmonary effort is normal. No respiratory distress.     Breath sounds: No wheezing.  Abdominal:     Palpations: Abdomen is soft.     Tenderness: There is no abdominal tenderness.  Musculoskeletal:     Cervical back: Normal range of motion.     Right lower leg: No edema.     Left lower leg: No edema.     Comments: No bony tenderness over joints or long bones of the upper and lower extremities.    No neck or back midline  tenderness, step-off, deformity, or bruising. Able to turn head left and right 45 degrees without difficulty.  Full range of motion of upper and lower extremity joints shown after palpation was conducted; with 5/5 symmetrical strength in upper and lower extremities. No chest wall tenderness, no facial or cranial tenderness.   Patient has intact sensation grossly in lower and upper extremities. Intact patellar and ankle reflexes. Patient able to ambulate without difficulty.  Radial and DP pulses palpated BL.    Skin:    General: Skin is warm and dry.     Capillary Refill: Capillary refill takes less than 2 seconds.  Neurological:     Mental Status: She is alert. Mental status is at baseline.  Psychiatric:        Mood and Affect: Mood normal.        Behavior: Behavior normal.     ED Results / Procedures / Treatments   Labs (all labs ordered are listed, but only abnormal results are displayed) Labs Reviewed - No data to display  EKG None  Radiology CT Head Wo Contrast  Result Date: 06/21/2022 CLINICAL DATA:  Fall, facial trauma, pain to bridge of nose EXAM: CT HEAD WITHOUT CONTRAST CT MAXILLOFACIAL WITHOUT CONTRAST TECHNIQUE: Multidetector CT imaging of the head and maxillofacial structures were performed using the standard protocol without intravenous contrast. Multiplanar CT image reconstructions of the maxillofacial structures were also generated. RADIATION DOSE REDUCTION: This exam was performed according to the departmental dose-optimization program which includes automated exposure control, adjustment of the mA and/or kV according to patient size and/or use of iterative reconstruction technique. COMPARISON:  None Available. FINDINGS: CT HEAD FINDINGS Brain: No evidence of acute infarction, hemorrhage, hydrocephalus, extra-axial collection or mass lesion/mass effect. Mild periventricular white matter hypodensity. Vascular: No hyperdense vessel or unexpected calcification. Skull: Normal.  Negative for fracture or focal lesion. Other: None. CT MAXILLOFACIAL FINDINGS Osseous: Mildly displaced fractures of the nasal bones (series 4, image 55). No other fracture or mandibular dislocation. No destructive process. Orbits: Negative. No traumatic or inflammatory finding. Sinuses: Clear. Soft tissues: Soft tissue edema of the nose. IMPRESSION: 1. No acute intracranial pathology. Small-vessel white matter disease. 2. Mildly displaced fractures of the nasal bones. No other displaced fracture or dislocation of the facial bones. Electronically Signed   By: Delanna Ahmadi M.D.   On: 06/21/2022 15:55   CT Maxillofacial Wo Contrast  Result Date: 06/21/2022 CLINICAL DATA:  Fall, facial trauma, pain to bridge of nose EXAM: CT HEAD WITHOUT CONTRAST CT MAXILLOFACIAL WITHOUT CONTRAST TECHNIQUE: Multidetector CT imaging of the head and maxillofacial structures were performed using the standard protocol without intravenous contrast. Multiplanar  CT image reconstructions of the maxillofacial structures were also generated. RADIATION DOSE REDUCTION: This exam was performed according to the departmental dose-optimization program which includes automated exposure control, adjustment of the mA and/or kV according to patient size and/or use of iterative reconstruction technique. COMPARISON:  None Available. FINDINGS: CT HEAD FINDINGS Brain: No evidence of acute infarction, hemorrhage, hydrocephalus, extra-axial collection or mass lesion/mass effect. Mild periventricular white matter hypodensity. Vascular: No hyperdense vessel or unexpected calcification. Skull: Normal. Negative for fracture or focal lesion. Other: None. CT MAXILLOFACIAL FINDINGS Osseous: Mildly displaced fractures of the nasal bones (series 4, image 55). No other fracture or mandibular dislocation. No destructive process. Orbits: Negative. No traumatic or inflammatory finding. Sinuses: Clear. Soft tissues: Soft tissue edema of the nose. IMPRESSION: 1. No acute  intracranial pathology. Small-vessel white matter disease. 2. Mildly displaced fractures of the nasal bones. No other displaced fracture or dislocation of the facial bones. Electronically Signed   By: Delanna Ahmadi M.D.   On: 06/21/2022 15:55    Procedures Procedures    Medications Ordered in ED Medications - No data to display  ED Course/ Medical Decision Making/ A&P Clinical Course as of 06/21/22 1606  Sat Jun 21, 2022  1603 IMPRESSION: 1. No acute intracranial pathology. Small-vessel white matter disease. 2. Mildly displaced fractures of the nasal bones. No other displaced fracture or dislocation of the facial bones.   Electronically Signed   By: Delanna Ahmadi M.D.   On: 06/21/2022 15:55   [WF]    Clinical Course User Index [WF] Tedd Sias, PA                           Medical Decision Making Amount and/or Complexity of Data Reviewed Radiology: ordered.   Patient is an 81 year old female with past medical history significant for HTN, anxiety, osteopenia, balance issues, hard of hearing, CKD, reflux  Patient is presented emergency room today with her daughter who is a Designer, jewellery.  It seems that patient had a mechanical fall yesterday evening around 10:30 PM when she attempted to reach down and grab something off the ground and over balance and fell forwards smacking her face on the ground which is a hardwood floor.  No loss of consciousness no nausea vomiting confusion.  Per daughter patient is at baseline mentation and also normal behavior.   She does endorse having some bruising to her face but states that she can breathe well through both nostrils  She is not on any blood thinners.   Physical exam is unremarkable apart from bilateral black eyes.  CT head and CT max face without contrast were obtained.  I personally viewed his images and agree with radiology read that there are mildly displaced fractures of the nasal bones no other displaced  fracture.  Updated patient on these results.  She is relieved to hear she does not have any intercranial bleeding.  We will provide patient with ENT follow-up information however she understands that this is generally treated with avoiding forceful blowing of nose and elevating head of bed.   Final Clinical Impression(s) / ED Diagnoses Final diagnoses:  Closed fracture of nasal bone, initial encounter    Rx / DC Orders ED Discharge Orders     None         Tedd Sias, [place] 06/21/22 1607    Varney Biles, MD 06/22/22 1524

## 2022-06-21 NOTE — Discharge Instructions (Addendum)
You do have several small and nondisplaced fractures of the nasal bones of your face.  No maxillary or other bony fractures.  Please refrain from blowing your nose if possible, I have given you the information for an ENT to follow-up with however these generally heal well on their own.  Elevating the head of the bed at night or sleeping of a wedge under the can help the swelling come down.

## 2022-07-08 ENCOUNTER — Ambulatory Visit (INDEPENDENT_AMBULATORY_CARE_PROVIDER_SITE_OTHER): Payer: PPO | Admitting: Nurse Practitioner

## 2022-07-08 ENCOUNTER — Encounter: Payer: Self-pay | Admitting: Nurse Practitioner

## 2022-07-08 VITALS — BP 108/66 | HR 66 | Ht 62.0 in | Wt 123.0 lb

## 2022-07-08 DIAGNOSIS — Z780 Asymptomatic menopausal state: Secondary | ICD-10-CM | POA: Diagnosis not present

## 2022-07-08 DIAGNOSIS — Z01419 Encounter for gynecological examination (general) (routine) without abnormal findings: Secondary | ICD-10-CM

## 2022-07-08 DIAGNOSIS — B3731 Acute candidiasis of vulva and vagina: Secondary | ICD-10-CM

## 2022-07-08 DIAGNOSIS — M810 Age-related osteoporosis without current pathological fracture: Secondary | ICD-10-CM

## 2022-07-08 DIAGNOSIS — R3915 Urgency of urination: Secondary | ICD-10-CM

## 2022-07-08 MED ORDER — FLUCONAZOLE 150 MG PO TABS: 150.0000 mg | ORAL_TABLET | ORAL | 0 refills | Status: DC

## 2022-07-08 NOTE — Progress Notes (Signed)
Brittany Douglas 10/01/1941 [id number]   History:  81 y.o. G2 P2 presents for breast and pelvic exam. Complains of urinary urgency that is not new for her. She denies dysuria, frequency, or hematuria. S/P 1972 TVH, using premarin for vaginal dryness/atrophy and UTI prevention. Osteoporosis managed by PCP, on Fosamax. HTN, HLD, MDD, CKD2, hypothyroidism, GERD managed by PCP.   Gynecologic History No LMP recorded. Patient has had a hysterectomy.   Contraception: status post hysterectomy Sexually active: No  Health maintenance Last Pap: no longer screening per guidelines and patient request Last mammogram: 08/01/2021 Results were: Normal. Scheduled next month Last colonoscopy: 2015. Results were: Normal Last Dexa: 06/19/2020. Results were: T-score -2.8. Scheduled in October  Past medical history, past surgical history, family history and social history were all reviewed and documented in the EPIC chart. Married. Moved to [place] from [place] to be close to daughter. Daughter is NP, has 2 children. Son lives in [place], has 1 son.   ROS:  A ROS was performed and pertinent positives and negatives are included.  Exam:  Vitals:   07/08/22 1141  BP: 108/66  Pulse: 66  SpO2: 96%  Weight: 123 lb (55.8 kg)  Height: '5\' 2"'$  (1.575 m)    Body mass index is 22.5 kg/m.  General appearance:  Normal Thyroid:  Symmetrical, normal in size, without palpable masses or nodularity. Respiratory  Auscultation:  Clear without wheezing or rhonchi Cardiovascular  Auscultation:  Regular rate, without rubs, murmurs or gallops  Edema/varicosities:  Not grossly evident Abdominal  Soft,nontender, without masses, guarding or rebound.  Liver/spleen:  No organomegaly noted  Hernia:  None appreciated  Skin  Inspection:  Grossly normal   Breasts: Examined lying and sitting.   Right: Without masses, retractions, discharge or axillary adenopathy.   Left: Without masses, retractions, discharge or  axillary adenopathy. Genitourinary   Inguinal/mons:  Normal without inguinal adenopathy  External genitalia:  Normal appearing vulva with no masses, tenderness, or lesions  BUS/Urethra/Skene's glands:  Normal  Vagina:  Normal appearing with normal color and discharge, no lesions. Atrophic changes  Cervix:  and uterus absent  Adnexa/parametria:     Rt: Normal in size, without masses or tenderness.   Lt: Normal in size, without masses or tenderness.  Anus and perineum: Normal  Digital rectal exam: Normal sphincter tone without palpated masses or tenderness  Patient informed chaperone available to be present for breast and pelvic exam. Patient has requested no chaperone to be present. Patient has been advised what will be completed during breast and pelvic exam.   UA: 1+ leukocytes, negative nitrites, negative blood, orangle/slightly cloudy. Microscopic: wnc 10-20, rbc none, moderate bacteria, few yeast (budding present)  Assessment/Plan:  81 y.o. G2 P2 for breast and pelvic exam.   Well female exam with routine gynecological exam - Education provided on SBEs, importance of preventative screenings, current guidelines, high calcium diet, regular exercise, and multivitamin daily. Labs with PCP.   Postmenopausal - no HRT, no bleeding.  Age-related osteoporosis without current pathological fracture - managed by PCP, on Fosamax. Does not tolerate calcium Taking Vitamin D. Limited exercise due to arthritic pain. DXA scheduled in October.   Urinary urgency - Plan: Urinalysis,Complete w/RFL Culture. Leukocytosis likely from vaginal infection. Will wait on culture to treat. Recommend voiding more often to avoid overfilling of bladder, double voiding, and no fluids 2 hours before bed.   Vaginal candidiasis - Plan: fluconazole (DIFLUCAN) 150 MG tablet today and repeat in 3 days for total of 2 doses.  Screening for cervical cancer - Normal Pap history.  No longer screening per guidelines.    Screening for breast cancer - Normal mammogram history.  Continue annual screenings.  Normal breast exam today. Scheduled next month.   Screening for colon cancer - 2015 colonoscopy. Screenings no longer recommended per GI.   Follow up in 1 year for medication follow up.       Tamela Gammon [facility], 12:39 PM 07/08/2022

## 2022-07-11 ENCOUNTER — Other Ambulatory Visit: Payer: Self-pay | Admitting: Nurse Practitioner

## 2022-07-11 DIAGNOSIS — N3000 Acute cystitis without hematuria: Secondary | ICD-10-CM

## 2022-07-11 LAB — URINALYSIS, COMPLETE W/RFL CULTURE
Bilirubin Urine: NEGATIVE
Glucose, UA: NEGATIVE
Hgb urine dipstick: NEGATIVE
Hyaline Cast: NONE SEEN /LPF
Nitrites, Initial: NEGATIVE
Protein, ur: NEGATIVE
RBC / HPF: NONE SEEN /HPF (ref 0–2)
Specific Gravity, Urine: 1.015 (ref 1.001–1.035)
pH: 7 (ref 5.0–8.0)

## 2022-07-11 LAB — URINE CULTURE
MICRO NUMBER:: 13691158
SPECIMEN QUALITY:: ADEQUATE

## 2022-07-11 LAB — CULTURE INDICATED

## 2022-07-11 MED ORDER — CIPROFLOXACIN HCL 500 MG PO TABS: 500.0000 mg | ORAL_TABLET | Freq: Two times a day (BID) | ORAL | 0 refills | Status: DC

## 2022-07-15 DIAGNOSIS — M79673 Pain in unspecified foot: Secondary | ICD-10-CM | POA: Diagnosis not present

## 2022-07-15 DIAGNOSIS — M7742 Metatarsalgia, left foot: Secondary | ICD-10-CM | POA: Diagnosis not present

## 2022-07-15 DIAGNOSIS — M7741 Metatarsalgia, right foot: Secondary | ICD-10-CM | POA: Diagnosis not present

## 2022-08-04 ENCOUNTER — Ambulatory Visit
Admission: RE | Admit: 2022-08-04 | Discharge: 2022-08-04 | Disposition: A | Discharge status: Home / Self Care | Payer: PPO | Source: Ambulatory Visit | Attending: Family Medicine | Admitting: Family Medicine

## 2022-08-04 DIAGNOSIS — Z1231 Encounter for screening mammogram for malignant neoplasm of breast: Secondary | ICD-10-CM

## 2022-08-07 DIAGNOSIS — L820 Inflamed seborrheic keratosis: Secondary | ICD-10-CM | POA: Diagnosis not present

## 2022-08-07 DIAGNOSIS — L821 Other seborrheic keratosis: Secondary | ICD-10-CM | POA: Diagnosis not present

## 2022-08-07 DIAGNOSIS — D225 Melanocytic nevi of trunk: Secondary | ICD-10-CM | POA: Diagnosis not present

## 2022-08-07 DIAGNOSIS — L578 Other skin changes due to chronic exposure to nonionizing radiation: Secondary | ICD-10-CM | POA: Diagnosis not present

## 2022-08-07 DIAGNOSIS — D2271 Melanocytic nevi of right lower limb, including hip: Secondary | ICD-10-CM | POA: Diagnosis not present

## 2022-08-28 DIAGNOSIS — R399 Unspecified symptoms and signs involving the genitourinary system: Secondary | ICD-10-CM | POA: Diagnosis not present

## 2022-08-28 DIAGNOSIS — E559 Vitamin D deficiency, unspecified: Secondary | ICD-10-CM | POA: Diagnosis not present

## 2022-08-28 DIAGNOSIS — E7800 Pure hypercholesterolemia, unspecified: Secondary | ICD-10-CM | POA: Diagnosis not present

## 2022-08-28 DIAGNOSIS — Z79899 Other long term (current) drug therapy: Secondary | ICD-10-CM | POA: Diagnosis not present

## 2022-08-28 DIAGNOSIS — E039 Hypothyroidism, unspecified: Secondary | ICD-10-CM | POA: Diagnosis not present

## 2022-09-02 DIAGNOSIS — I700 Atherosclerosis of aorta: Secondary | ICD-10-CM | POA: Diagnosis not present

## 2022-09-02 DIAGNOSIS — I10 Essential (primary) hypertension: Secondary | ICD-10-CM | POA: Diagnosis not present

## 2022-09-02 DIAGNOSIS — F339 Major depressive disorder, recurrent, unspecified: Secondary | ICD-10-CM | POA: Diagnosis not present

## 2022-09-02 DIAGNOSIS — E039 Hypothyroidism, unspecified: Secondary | ICD-10-CM | POA: Diagnosis not present

## 2022-09-02 DIAGNOSIS — Z0000 Encounter for general adult medical examination without abnormal findings: Secondary | ICD-10-CM | POA: Diagnosis not present

## 2022-09-02 DIAGNOSIS — E7800 Pure hypercholesterolemia, unspecified: Secondary | ICD-10-CM | POA: Diagnosis not present

## 2022-09-02 DIAGNOSIS — R399 Unspecified symptoms and signs involving the genitourinary system: Secondary | ICD-10-CM | POA: Diagnosis not present

## 2022-09-02 DIAGNOSIS — Z79899 Other long term (current) drug therapy: Secondary | ICD-10-CM | POA: Diagnosis not present

## 2022-09-02 DIAGNOSIS — D692 Other nonthrombocytopenic purpura: Secondary | ICD-10-CM | POA: Diagnosis not present

## 2022-09-02 DIAGNOSIS — H6123 Impacted cerumen, bilateral: Secondary | ICD-10-CM | POA: Diagnosis not present

## 2022-09-02 DIAGNOSIS — M818 Other osteoporosis without current pathological fracture: Secondary | ICD-10-CM | POA: Diagnosis not present

## 2022-09-02 DIAGNOSIS — I129 Hypertensive chronic kidney disease with stage 1 through stage 4 chronic kidney disease, or unspecified chronic kidney disease: Secondary | ICD-10-CM | POA: Diagnosis not present

## 2022-09-07 ENCOUNTER — Telehealth: Payer: PPO | Admitting: Family

## 2022-09-07 DIAGNOSIS — U071 COVID-19: Secondary | ICD-10-CM

## 2022-09-07 MED ORDER — MOLNUPIRAVIR EUA 200MG CAPSULE: 4.0000 | ORAL_CAPSULE | Freq: Two times a day (BID) | ORAL | 0 refills | Status: AC

## 2022-09-07 NOTE — Progress Notes (Signed)
Virtual Visit Consent   Brittany Douglas, you are scheduled for a virtual visit with a [facility] provider today. Just as with appointments in the office, your consent must be obtained to participate. Your consent will be active for this visit and any virtual visit you may have with one of our providers in the next 365 days. If you have a MyChart account, a copy of this consent can be sent to you electronically.  As this is a virtual visit, video technology does not allow for your provider to perform a traditional examination. This may limit your provider's ability to fully assess your condition. If your provider identifies any concerns that need to be evaluated in person or the need to arrange testing (such as labs, EKG, etc.), we will make arrangements to do so. Although advances in technology are sophisticated, we cannot ensure that it will always work on either your end or our end. If the connection with a video visit is poor, the visit may have to be switched to a telephone visit. With either a video or telephone visit, we are not always able to ensure that we have a secure connection.  By engaging in this virtual visit, you consent to the provision of healthcare and authorize for your insurance to be billed (if applicable) for the services provided during this visit. Depending on your insurance coverage, you may receive a charge related to this service.  I need to obtain your verbal consent now. Are you willing to proceed with your visit today? Brittany Douglas has provided verbal consent on 09/07/2022 for a virtual visit (video or telephone). Evelina Dun, FNP  Date: 09/07/2022 1:35 PM  Virtual Visit via Video Note   I, Evelina Dun, connected with  Brittany Douglas  ([id number], [birth year]) on 09/07/22 at  1:30 PM EDT by a video-enabled telemedicine application and verified that I am speaking with the correct person using two identifiers.  Location: Patient: Virtual Visit  Location Patient: Home Provider: Virtual Visit Location Provider: Home Office   I discussed the limitations of evaluation and management by telemedicine and the availability of in person appointments. The patient expressed understanding and agreed to proceed.    History of Present Illness: Brittany Douglas is a 81 y.o. who identifies as a female who was assigned female at birth, and is being seen today for COVID. She reports her symptoms started last night and tested positive this morning.   HPI: Cough This is a new problem. The current episode started yesterday. The problem has been gradually worsening. The problem occurs every few minutes. The cough is Non-productive. Associated symptoms include headaches, nasal congestion and a sore throat. Pertinent negatives include no chills, ear congestion, ear pain, fever, postnasal drip, shortness of breath or weight loss. She has tried rest for the symptoms.    Problems:  Patient Active Problem List   Diagnosis Date Noted   Elevated cholesterol    Hypertension    Anxiety    IBS (irritable bowel syndrome)    Reflux     Allergies:  Allergies  Allergen Reactions   Nitrofurantoin     Upset stomach   Ace Inhibitors    Amlodipine    Codeine Nausea And Vomiting   Cymbalta [Duloxetine Hcl]    Doxycycline    Gabapentin Nausea Only   Hctz [Hydrochlorothiazide]    Lisinopril     cough   Penicillins Hives   Sulfa Antibiotics Rash   Medications:  Current Outpatient Medications:  molnupiravir EUA (LAGEVRIO) 200 mg CAPS capsule, Take 4 capsules (800 mg total) by mouth 2 (two) times daily for 5 days., Disp: 40 capsule, Rfl: 0   alendronate (FOSAMAX) 70 MG tablet, Take 70 mg by mouth once a week. Take with a full glass of water on an empty stomach., Disp: , Rfl:    amLODipine (NORVASC) 5 MG tablet, Take 5 mg by mouth in the morning and at bedtime., Disp: , Rfl:    aspirin 81 MG tablet, Take 81 mg by mouth daily., Disp: , Rfl:    CALCIUM PO,  Take by mouth., Disp: , Rfl:    Cholecalciferol (VITAMIN D PO), Take 2,000 Int'l Units by mouth., Disp: , Rfl:    Coenzyme Q10 (CO Q 10) 100 MG CAPS, 1 capsule with a meal Orally Once a day, Disp: , Rfl:    conjugated estrogens (PREMARIN) vaginal cream, INSERT 1/4 APPLICATOR VAGINALLY 2 TIMES A WEEK AS DIRECTED, Disp: 30 g, Rfl: 3   diazepam (VALIUM) 2 MG tablet, Take 2 mg by mouth every 6 (six) hours as needed for anxiety., Disp: , Rfl:    diclofenac Sodium (VOLTAREN) 1 % GEL, APPLY 2 GRAMS TO THE AFFECTED AREA(S) BY TOPICAL ROUTE 4 TIMES PER DAY, Disp: , Rfl:    Flaxseed, Linseed, (FLAX SEEDS) POWD, Orally, Disp: , Rfl:    fluconazole (DIFLUCAN) 150 MG tablet, Take 1 tablet (150 mg total) by mouth every 3 (three) days., Disp: 2 tablet, Rfl: 0   levothyroxine (SYNTHROID) 25 MCG tablet, Take 25 mcg by mouth every morning., Disp: , Rfl:    LORATADINE PO, Take by mouth., Disp: , Rfl:    losartan (COZAAR) 100 MG tablet, 1 tablet Orally Once a day, Disp: , Rfl:    Multiple Vitamin (MULTIVITAMIN) tablet, Take 1 tablet by mouth daily., Disp: , Rfl:    Omega-3 Fatty Acids (FISH OIL) 1200 MG CAPS, Take by mouth., Disp: , Rfl:    Polyethylene Glycol 3350 (MIRALAX PO), Take by mouth., Disp: , Rfl:    rosuvastatin (CRESTOR) 5 MG tablet, Take 5 mg by mouth daily., Disp: , Rfl:    sertraline (ZOLOFT) 100 MG tablet, 1 tablet Orally Once a day, Disp: , Rfl:   Observations/Objective: Patient is well-developed, well-nourished in no acute distress.  Resting comfortably  at home.  Head is normocephalic, atraumatic.  No labored breathing.  Speech is clear and coherent with logical content.  Patient is alert and oriented at baseline.    Assessment and Plan: 1. COVID-19 - molnupiravir EUA (LAGEVRIO) 200 mg CAPS capsule; Take 4 capsules (800 mg total) by mouth 2 (two) times daily for 5 days.  Dispense: 40 capsule; Refill: 0  COVID positive, rest, force fluids, tylenol as needed, Quarantine for at least 5 days  and you are fever free, then must wear a mask out in public from day 2-50, report any worsening symptoms such as increased shortness of breath, swelling, or continued high fevers. Possible adverse effects discussed with antivirals.    Follow Up Instructions: I discussed the assessment and treatment plan with the patient. The patient was provided an opportunity to ask questions and all were answered. The patient agreed with the plan and demonstrated an understanding of the instructions.  A copy of instructions were sent to the patient via MyChart unless otherwise noted below.     The patient was advised to call back or seek an in-person evaluation if the symptoms worsen or if the condition fails to improve as anticipated.  Time:  I spent 8 minutes with the patient via telehealth technology discussing the above problems/concerns.    Evelina Dun, FNP

## 2022-09-23 ENCOUNTER — Ambulatory Visit
Admission: RE | Admit: 2022-09-23 | Discharge: 2022-09-23 | Disposition: A | Discharge status: Home / Self Care | Payer: PPO | Source: Ambulatory Visit | Attending: Family Medicine | Admitting: Family Medicine

## 2022-09-23 DIAGNOSIS — M810 Age-related osteoporosis without current pathological fracture: Secondary | ICD-10-CM | POA: Diagnosis not present

## 2022-09-23 DIAGNOSIS — E2839 Other primary ovarian failure: Secondary | ICD-10-CM

## 2022-09-23 DIAGNOSIS — Z780 Asymptomatic menopausal state: Secondary | ICD-10-CM | POA: Diagnosis not present

## 2022-10-11 IMAGING — MG MM DIGITAL SCREENING BILAT W/ TOMO AND CAD
8 series · 9 of 24 positions shown · non-contrast
Comparison: Previous exam(s).

CLINICAL DATA: Screening.

EXAM:
DIGITAL SCREENING BILATERAL MAMMOGRAM WITH TOMOSYNTHESIS AND CAD
TECHNIQUE: Bilateral screening digital craniocaudal and mediolateral oblique
mammograms were obtained. Bilateral screening digital breast
tomosynthesis was performed. The images were evaluated with
computer-aided detection.

[L CC synth-2D]
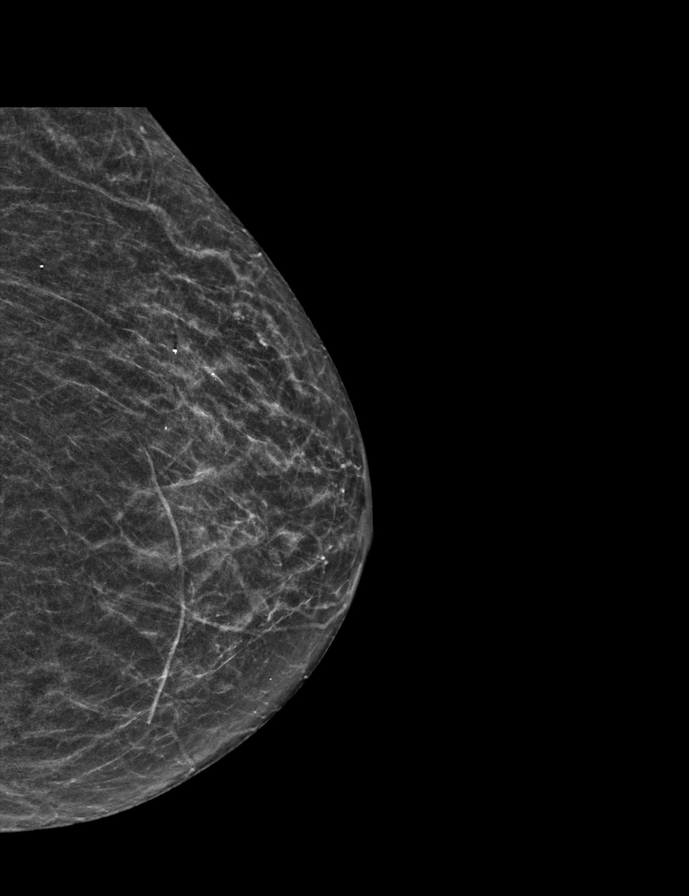

[R MLO synth-2D]
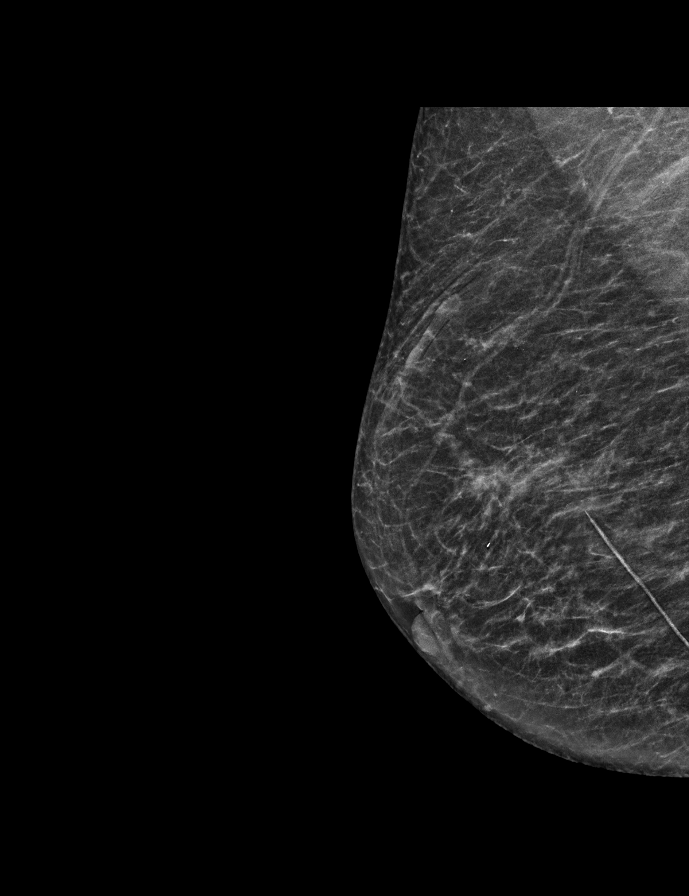

[R CC synth-2D]
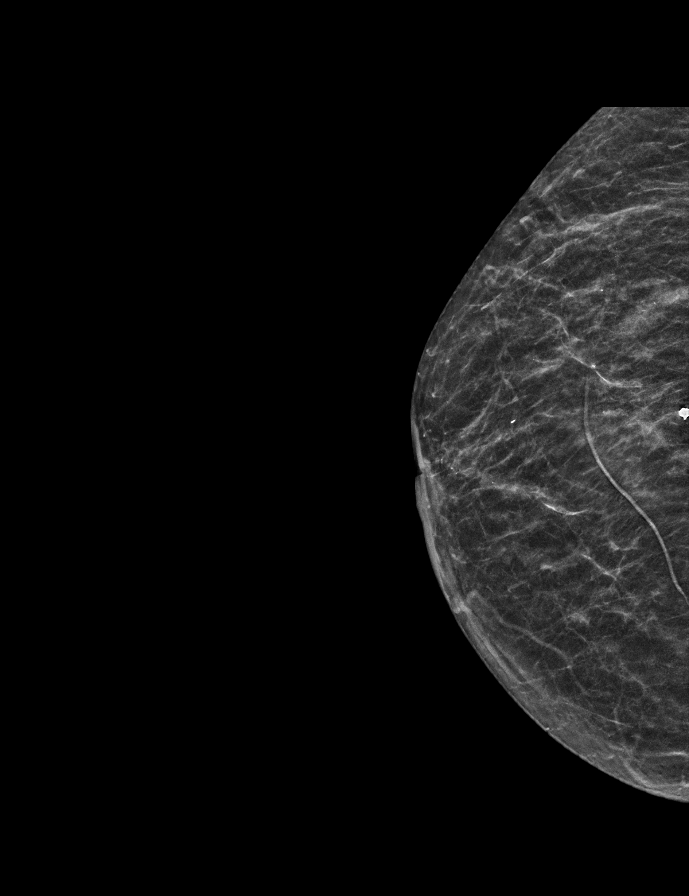

[L MLO synth-2D]
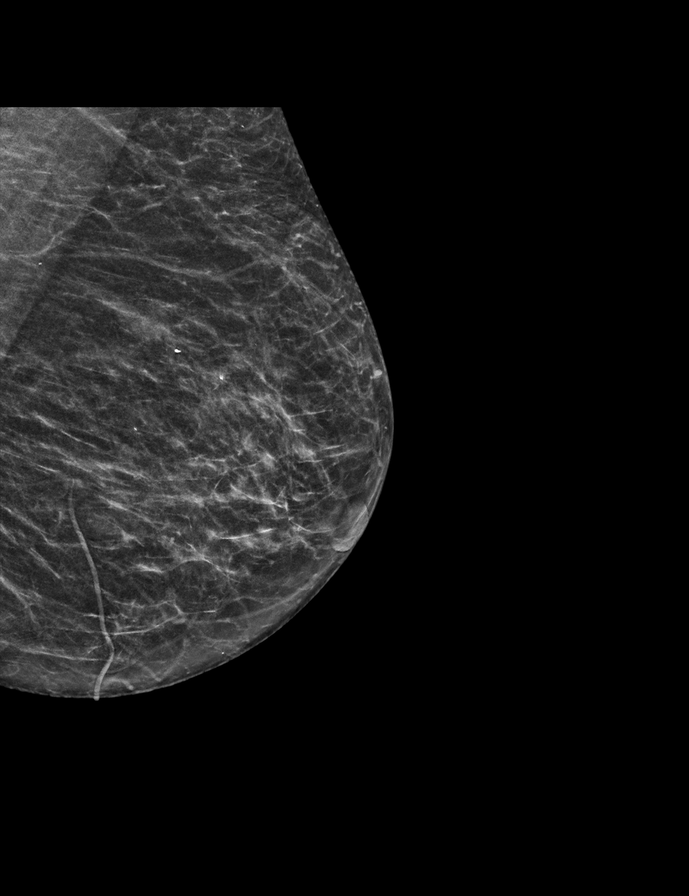

[R CC tomo · 2 of 39 frames shown]
[frame 13/39]
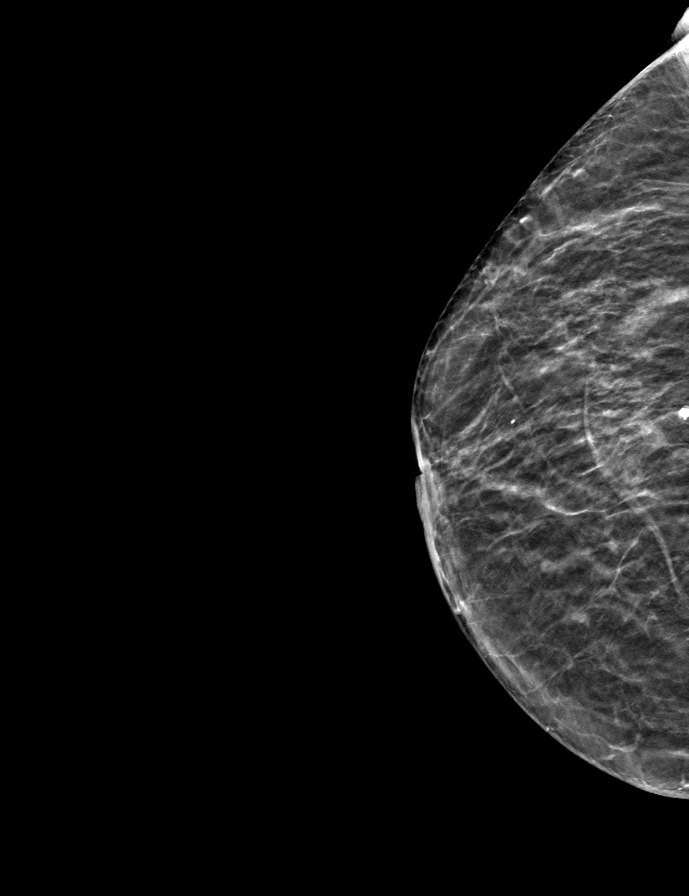
[frame 20/39]
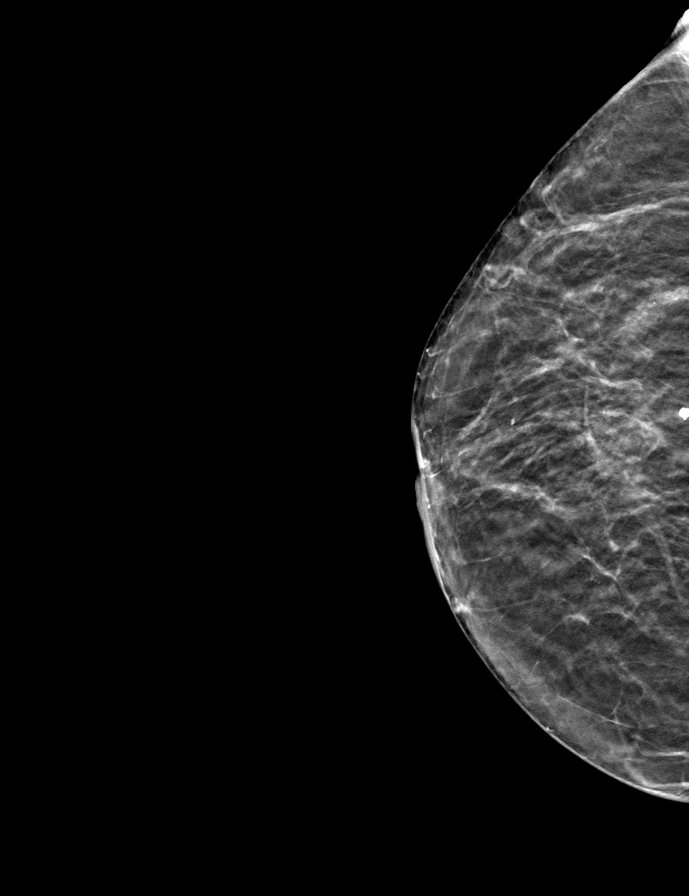

[L MLO tomo · tomo slice 25/49.0]
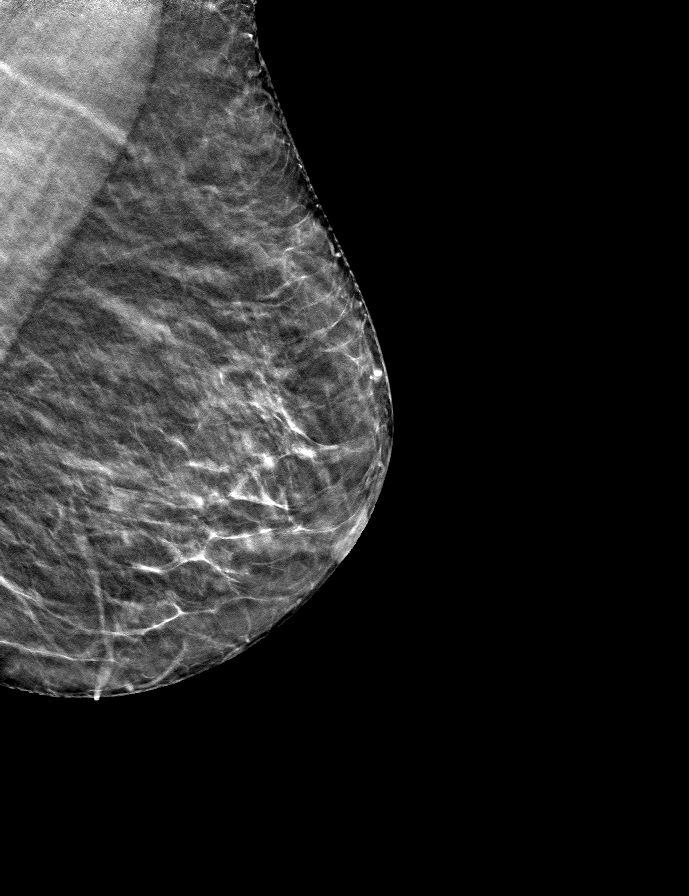

[R MLO tomo · tomo slice 23/45.0]
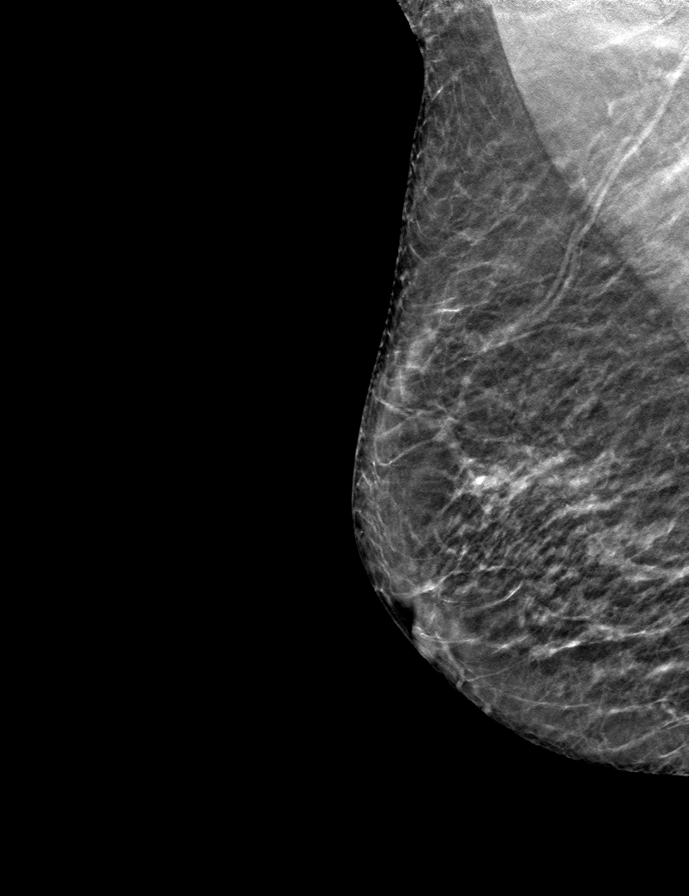

[L CC tomo · tomo slice 23/44.0]
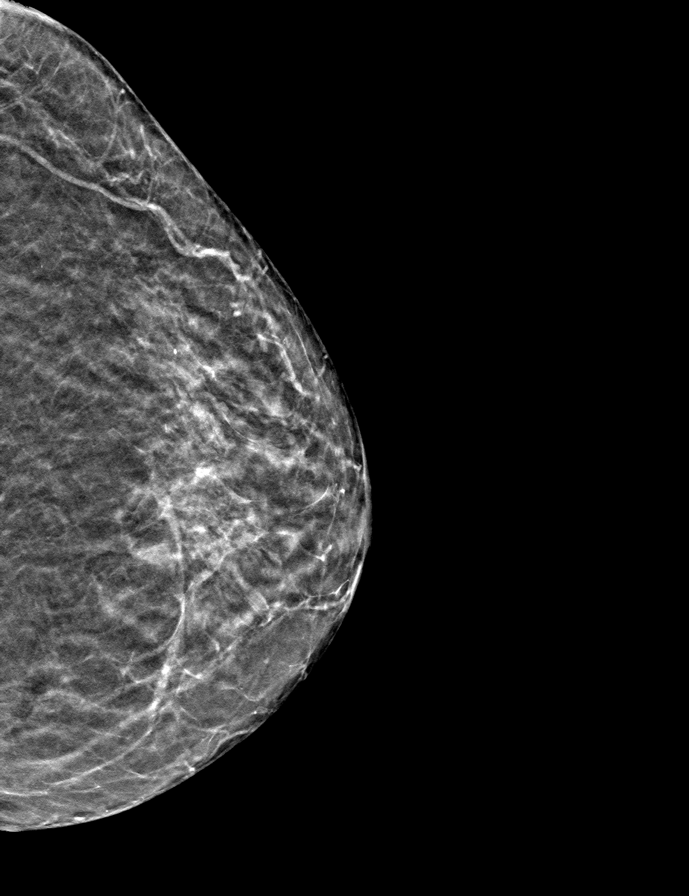

[9 of 24 positions shown; findings below may reference images not displayed]

ACR Breast Density Category b: There are scattered areas of
fibroglandular density.
FINDINGS: There are no findings suspicious for malignancy.
IMPRESSION: No mammographic evidence of malignancy. A result letter of this
screening mammogram will be mailed directly to the patient.

RECOMMENDATION:
Screening mammogram in one year. (Code:51-O-LD2)

BI-RADS CATEGORY  1: Negative.

## 2022-10-14 DIAGNOSIS — E039 Hypothyroidism, unspecified: Secondary | ICD-10-CM | POA: Diagnosis not present

## 2022-11-05 DIAGNOSIS — H0100B Unspecified blepharitis left eye, upper and lower eyelids: Secondary | ICD-10-CM | POA: Diagnosis not present

## 2022-11-05 DIAGNOSIS — H26491 Other secondary cataract, right eye: Secondary | ICD-10-CM | POA: Diagnosis not present

## 2022-11-05 DIAGNOSIS — H524 Presbyopia: Secondary | ICD-10-CM | POA: Diagnosis not present

## 2022-11-05 DIAGNOSIS — H5213 Myopia, bilateral: Secondary | ICD-10-CM | POA: Diagnosis not present

## 2022-11-05 DIAGNOSIS — H0100A Unspecified blepharitis right eye, upper and lower eyelids: Secondary | ICD-10-CM | POA: Diagnosis not present

## 2022-11-05 DIAGNOSIS — H43813 Vitreous degeneration, bilateral: Secondary | ICD-10-CM | POA: Diagnosis not present

## 2022-11-11 DIAGNOSIS — M7741 Metatarsalgia, right foot: Secondary | ICD-10-CM | POA: Diagnosis not present

## 2022-11-11 DIAGNOSIS — M79673 Pain in unspecified foot: Secondary | ICD-10-CM | POA: Diagnosis not present

## 2022-11-11 DIAGNOSIS — M7742 Metatarsalgia, left foot: Secondary | ICD-10-CM | POA: Diagnosis not present

## 2022-11-14 DIAGNOSIS — Z8781 Personal history of (healed) traumatic fracture: Secondary | ICD-10-CM | POA: Diagnosis not present

## 2022-11-14 DIAGNOSIS — M810 Age-related osteoporosis without current pathological fracture: Secondary | ICD-10-CM | POA: Diagnosis not present

## 2022-11-14 DIAGNOSIS — Z92241 Personal history of systemic steroid therapy: Secondary | ICD-10-CM | POA: Diagnosis not present

## 2022-11-20 ENCOUNTER — Other Ambulatory Visit: Payer: Self-pay | Admitting: Obstetrics & Gynecology

## 2022-11-20 NOTE — Telephone Encounter (Signed)
Last annual exam  was 06/2021 No exam scheduled

## 2022-11-21 ENCOUNTER — Other Ambulatory Visit: Payer: Self-pay | Admitting: Obstetrics & Gynecology

## 2022-11-24 NOTE — Telephone Encounter (Signed)
Med refill request: Premarin vaginal cream  Last AEX: 07/08/22/ TW Next AEX: Not scheduled  Last MMG (if hormonal med) 08/04/22, Bi Rads 1 neg  Rx pended #30 grams/ 0RF needs OV for refills.  Refill authorized: Please Advise?

## 2022-12-19 ENCOUNTER — Emergency Department (HOSPITAL_BASED_OUTPATIENT_CLINIC_OR_DEPARTMENT_OTHER): Payer: PPO | Admitting: Radiology

## 2022-12-19 ENCOUNTER — Other Ambulatory Visit: Payer: Self-pay

## 2022-12-19 ENCOUNTER — Encounter (HOSPITAL_BASED_OUTPATIENT_CLINIC_OR_DEPARTMENT_OTHER): Payer: Self-pay

## 2022-12-19 DIAGNOSIS — M810 Age-related osteoporosis without current pathological fracture: Secondary | ICD-10-CM | POA: Diagnosis present

## 2022-12-19 DIAGNOSIS — Z883 Allergy status to other anti-infective agents status: Secondary | ICD-10-CM

## 2022-12-19 DIAGNOSIS — R0902 Hypoxemia: Secondary | ICD-10-CM | POA: Diagnosis not present

## 2022-12-19 DIAGNOSIS — M1990 Unspecified osteoarthritis, unspecified site: Secondary | ICD-10-CM | POA: Diagnosis not present

## 2022-12-19 DIAGNOSIS — F411 Generalized anxiety disorder: Secondary | ICD-10-CM | POA: Diagnosis present

## 2022-12-19 DIAGNOSIS — E876 Hypokalemia: Secondary | ICD-10-CM | POA: Diagnosis present

## 2022-12-19 DIAGNOSIS — Z882 Allergy status to sulfonamides status: Secondary | ICD-10-CM

## 2022-12-19 DIAGNOSIS — Z7982 Long term (current) use of aspirin: Secondary | ICD-10-CM

## 2022-12-19 DIAGNOSIS — W010XXA Fall on same level from slipping, tripping and stumbling without subsequent striking against object, initial encounter: Secondary | ICD-10-CM | POA: Diagnosis present

## 2022-12-19 DIAGNOSIS — J309 Allergic rhinitis, unspecified: Secondary | ICD-10-CM | POA: Diagnosis not present

## 2022-12-19 DIAGNOSIS — Z833 Family history of diabetes mellitus: Secondary | ICD-10-CM

## 2022-12-19 DIAGNOSIS — Z96641 Presence of right artificial hip joint: Secondary | ICD-10-CM | POA: Diagnosis not present

## 2022-12-19 DIAGNOSIS — W19XXXA Unspecified fall, initial encounter: Secondary | ICD-10-CM | POA: Diagnosis not present

## 2022-12-19 DIAGNOSIS — J189 Pneumonia, unspecified organism: Secondary | ICD-10-CM | POA: Diagnosis not present

## 2022-12-19 DIAGNOSIS — Z83438 Family history of other disorder of lipoprotein metabolism and other lipidemia: Secondary | ICD-10-CM

## 2022-12-19 DIAGNOSIS — Z01818 Encounter for other preprocedural examination: Secondary | ICD-10-CM | POA: Diagnosis not present

## 2022-12-19 DIAGNOSIS — M1611 Unilateral primary osteoarthritis, right hip: Secondary | ICD-10-CM | POA: Diagnosis present

## 2022-12-19 DIAGNOSIS — K589 Irritable bowel syndrome without diarrhea: Secondary | ICD-10-CM | POA: Diagnosis present

## 2022-12-19 DIAGNOSIS — E039 Hypothyroidism, unspecified: Secondary | ICD-10-CM | POA: Diagnosis present

## 2022-12-19 DIAGNOSIS — R2689 Other abnormalities of gait and mobility: Secondary | ICD-10-CM | POA: Diagnosis not present

## 2022-12-19 DIAGNOSIS — K219 Gastro-esophageal reflux disease without esophagitis: Secondary | ICD-10-CM | POA: Diagnosis present

## 2022-12-19 DIAGNOSIS — Z043 Encounter for examination and observation following other accident: Secondary | ICD-10-CM | POA: Diagnosis not present

## 2022-12-19 DIAGNOSIS — S79919A Unspecified injury of unspecified hip, initial encounter: Secondary | ICD-10-CM | POA: Diagnosis not present

## 2022-12-19 DIAGNOSIS — E569 Vitamin deficiency, unspecified: Secondary | ICD-10-CM | POA: Diagnosis not present

## 2022-12-19 DIAGNOSIS — Y92002 Bathroom of unspecified non-institutional (private) residence single-family (private) house as the place of occurrence of the external cause: Secondary | ICD-10-CM

## 2022-12-19 DIAGNOSIS — S72091A Other fracture of head and neck of right femur, initial encounter for closed fracture: Principal | ICD-10-CM | POA: Diagnosis present

## 2022-12-19 DIAGNOSIS — Z7401 Bed confinement status: Secondary | ICD-10-CM | POA: Diagnosis not present

## 2022-12-19 DIAGNOSIS — I509 Heart failure, unspecified: Secondary | ICD-10-CM | POA: Diagnosis not present

## 2022-12-19 DIAGNOSIS — Z79899 Other long term (current) drug therapy: Secondary | ICD-10-CM

## 2022-12-19 DIAGNOSIS — Z8249 Family history of ischemic heart disease and other diseases of the circulatory system: Secondary | ICD-10-CM

## 2022-12-19 DIAGNOSIS — Z885 Allergy status to narcotic agent status: Secondary | ICD-10-CM

## 2022-12-19 DIAGNOSIS — I129 Hypertensive chronic kidney disease with stage 1 through stage 4 chronic kidney disease, or unspecified chronic kidney disease: Secondary | ICD-10-CM | POA: Diagnosis present

## 2022-12-19 DIAGNOSIS — S72001D Fracture of unspecified part of neck of right femur, subsequent encounter for closed fracture with routine healing: Secondary | ICD-10-CM | POA: Diagnosis not present

## 2022-12-19 DIAGNOSIS — S50311A Abrasion of right elbow, initial encounter: Secondary | ICD-10-CM | POA: Diagnosis present

## 2022-12-19 DIAGNOSIS — Z1152 Encounter for screening for COVID-19: Secondary | ICD-10-CM | POA: Diagnosis not present

## 2022-12-19 DIAGNOSIS — J90 Pleural effusion, not elsewhere classified: Secondary | ICD-10-CM | POA: Diagnosis not present

## 2022-12-19 DIAGNOSIS — S72001A Fracture of unspecified part of neck of right femur, initial encounter for closed fracture: Secondary | ICD-10-CM | POA: Diagnosis not present

## 2022-12-19 DIAGNOSIS — N182 Chronic kidney disease, stage 2 (mild): Secondary | ICD-10-CM | POA: Diagnosis present

## 2022-12-19 DIAGNOSIS — M25531 Pain in right wrist: Secondary | ICD-10-CM | POA: Diagnosis not present

## 2022-12-19 DIAGNOSIS — R52 Pain, unspecified: Secondary | ICD-10-CM | POA: Diagnosis not present

## 2022-12-19 DIAGNOSIS — Z8349 Family history of other endocrine, nutritional and metabolic diseases: Secondary | ICD-10-CM

## 2022-12-19 DIAGNOSIS — Z803 Family history of malignant neoplasm of breast: Secondary | ICD-10-CM

## 2022-12-19 DIAGNOSIS — Z7983 Long term (current) use of bisphosphonates: Secondary | ICD-10-CM | POA: Diagnosis not present

## 2022-12-19 DIAGNOSIS — M25551 Pain in right hip: Secondary | ICD-10-CM | POA: Diagnosis present

## 2022-12-19 DIAGNOSIS — Z66 Do not resuscitate: Secondary | ICD-10-CM | POA: Diagnosis present

## 2022-12-19 DIAGNOSIS — F32A Depression, unspecified: Secondary | ICD-10-CM | POA: Diagnosis present

## 2022-12-19 DIAGNOSIS — Y92009 Unspecified place in unspecified non-institutional (private) residence as the place of occurrence of the external cause: Secondary | ICD-10-CM | POA: Diagnosis not present

## 2022-12-19 DIAGNOSIS — E786 Lipoprotein deficiency: Secondary | ICD-10-CM | POA: Diagnosis not present

## 2022-12-19 DIAGNOSIS — S7291XA Unspecified fracture of right femur, initial encounter for closed fracture: Secondary | ICD-10-CM | POA: Diagnosis not present

## 2022-12-19 DIAGNOSIS — Z880 Allergy status to penicillin: Secondary | ICD-10-CM

## 2022-12-19 DIAGNOSIS — Z888 Allergy status to other drugs, medicaments and biological substances status: Secondary | ICD-10-CM

## 2022-12-19 DIAGNOSIS — E7800 Pure hypercholesterolemia, unspecified: Secondary | ICD-10-CM | POA: Diagnosis present

## 2022-12-19 DIAGNOSIS — E559 Vitamin D deficiency, unspecified: Secondary | ICD-10-CM | POA: Diagnosis present

## 2022-12-19 DIAGNOSIS — J9601 Acute respiratory failure with hypoxia: Secondary | ICD-10-CM | POA: Diagnosis not present

## 2022-12-19 DIAGNOSIS — R278 Other lack of coordination: Secondary | ICD-10-CM | POA: Diagnosis not present

## 2022-12-19 DIAGNOSIS — Z90710 Acquired absence of both cervix and uterus: Secondary | ICD-10-CM

## 2022-12-19 DIAGNOSIS — Z8049 Family history of malignant neoplasm of other genital organs: Secondary | ICD-10-CM

## 2022-12-19 DIAGNOSIS — Z79890 Hormone replacement therapy (postmenopausal): Secondary | ICD-10-CM | POA: Diagnosis not present

## 2022-12-19 DIAGNOSIS — Z471 Aftercare following joint replacement surgery: Secondary | ICD-10-CM | POA: Diagnosis not present

## 2022-12-19 DIAGNOSIS — J181 Lobar pneumonia, unspecified organism: Secondary | ICD-10-CM | POA: Diagnosis not present

## 2022-12-19 DIAGNOSIS — F418 Other specified anxiety disorders: Secondary | ICD-10-CM | POA: Diagnosis not present

## 2022-12-19 DIAGNOSIS — M6281 Muscle weakness (generalized): Secondary | ICD-10-CM | POA: Diagnosis not present

## 2022-12-19 DIAGNOSIS — E871 Hypo-osmolality and hyponatremia: Secondary | ICD-10-CM | POA: Diagnosis present

## 2022-12-19 DIAGNOSIS — S72009B Fracture of unspecified part of neck of unspecified femur, initial encounter for open fracture type I or II: Secondary | ICD-10-CM | POA: Diagnosis not present

## 2022-12-19 DIAGNOSIS — F419 Anxiety disorder, unspecified: Secondary | ICD-10-CM | POA: Diagnosis not present

## 2022-12-19 DIAGNOSIS — Z823 Family history of stroke: Secondary | ICD-10-CM

## 2022-12-19 DIAGNOSIS — J9811 Atelectasis: Secondary | ICD-10-CM | POA: Diagnosis not present

## 2022-12-19 DIAGNOSIS — I10 Essential (primary) hypertension: Secondary | ICD-10-CM | POA: Diagnosis not present

## 2022-12-19 NOTE — ED Triage Notes (Signed)
Pt was standing in front of sink in bathroom trying to wash foot. Pt lost her balance and fell onto ceramic tile floor. Pt c/o pain in right hip, right elbow and right wrist. Pt states she is unable to stand on right leg.

## 2022-12-20 ENCOUNTER — Encounter (HOSPITAL_COMMUNITY): Payer: Self-pay

## 2022-12-20 ENCOUNTER — Other Ambulatory Visit: Payer: Self-pay

## 2022-12-20 ENCOUNTER — Emergency Department (HOSPITAL_BASED_OUTPATIENT_CLINIC_OR_DEPARTMENT_OTHER): Payer: PPO

## 2022-12-20 ENCOUNTER — Inpatient Hospital Stay (HOSPITAL_BASED_OUTPATIENT_CLINIC_OR_DEPARTMENT_OTHER)
Admission: EM | Admit: 2022-12-20 | Discharge: 2022-12-25 | DRG: 521 | Disposition: A | Discharge status: Skilled Nursing Facility | Payer: PPO | Attending: Internal Medicine | Admitting: Internal Medicine

## 2022-12-20 DIAGNOSIS — F419 Anxiety disorder, unspecified: Secondary | ICD-10-CM | POA: Diagnosis present

## 2022-12-20 DIAGNOSIS — M25551 Pain in right hip: Secondary | ICD-10-CM | POA: Diagnosis present

## 2022-12-20 DIAGNOSIS — Y92009 Unspecified place in unspecified non-institutional (private) residence as the place of occurrence of the external cause: Secondary | ICD-10-CM | POA: Diagnosis not present

## 2022-12-20 DIAGNOSIS — J189 Pneumonia, unspecified organism: Secondary | ICD-10-CM | POA: Diagnosis not present

## 2022-12-20 DIAGNOSIS — Z7982 Long term (current) use of aspirin: Secondary | ICD-10-CM | POA: Diagnosis not present

## 2022-12-20 DIAGNOSIS — J9601 Acute respiratory failure with hypoxia: Secondary | ICD-10-CM | POA: Diagnosis present

## 2022-12-20 DIAGNOSIS — F411 Generalized anxiety disorder: Secondary | ICD-10-CM | POA: Diagnosis present

## 2022-12-20 DIAGNOSIS — Z043 Encounter for examination and observation following other accident: Secondary | ICD-10-CM | POA: Diagnosis not present

## 2022-12-20 DIAGNOSIS — Z882 Allergy status to sulfonamides status: Secondary | ICD-10-CM | POA: Diagnosis not present

## 2022-12-20 DIAGNOSIS — Z79899 Other long term (current) drug therapy: Secondary | ICD-10-CM | POA: Diagnosis not present

## 2022-12-20 DIAGNOSIS — Z888 Allergy status to other drugs, medicaments and biological substances status: Secondary | ICD-10-CM | POA: Diagnosis not present

## 2022-12-20 DIAGNOSIS — S72001A Fracture of unspecified part of neck of right femur, initial encounter for closed fracture: Principal | ICD-10-CM | POA: Diagnosis present

## 2022-12-20 DIAGNOSIS — F32A Depression, unspecified: Secondary | ICD-10-CM | POA: Diagnosis present

## 2022-12-20 DIAGNOSIS — Y92002 Bathroom of unspecified non-institutional (private) residence single-family (private) house as the place of occurrence of the external cause: Secondary | ICD-10-CM | POA: Diagnosis not present

## 2022-12-20 DIAGNOSIS — S50311A Abrasion of right elbow, initial encounter: Secondary | ICD-10-CM | POA: Diagnosis present

## 2022-12-20 DIAGNOSIS — I10 Essential (primary) hypertension: Secondary | ICD-10-CM | POA: Diagnosis present

## 2022-12-20 DIAGNOSIS — Z66 Do not resuscitate: Secondary | ICD-10-CM | POA: Diagnosis present

## 2022-12-20 DIAGNOSIS — I129 Hypertensive chronic kidney disease with stage 1 through stage 4 chronic kidney disease, or unspecified chronic kidney disease: Secondary | ICD-10-CM | POA: Diagnosis present

## 2022-12-20 DIAGNOSIS — M25531 Pain in right wrist: Secondary | ICD-10-CM | POA: Diagnosis not present

## 2022-12-20 DIAGNOSIS — Z885 Allergy status to narcotic agent status: Secondary | ICD-10-CM | POA: Diagnosis not present

## 2022-12-20 DIAGNOSIS — Z1152 Encounter for screening for COVID-19: Secondary | ICD-10-CM | POA: Diagnosis not present

## 2022-12-20 DIAGNOSIS — E039 Hypothyroidism, unspecified: Secondary | ICD-10-CM | POA: Diagnosis present

## 2022-12-20 DIAGNOSIS — M810 Age-related osteoporosis without current pathological fracture: Secondary | ICD-10-CM | POA: Diagnosis present

## 2022-12-20 DIAGNOSIS — E559 Vitamin D deficiency, unspecified: Secondary | ICD-10-CM | POA: Diagnosis present

## 2022-12-20 DIAGNOSIS — I509 Heart failure, unspecified: Secondary | ICD-10-CM | POA: Diagnosis not present

## 2022-12-20 DIAGNOSIS — E871 Hypo-osmolality and hyponatremia: Secondary | ICD-10-CM | POA: Diagnosis present

## 2022-12-20 DIAGNOSIS — Z880 Allergy status to penicillin: Secondary | ICD-10-CM | POA: Diagnosis not present

## 2022-12-20 DIAGNOSIS — S7291XA Unspecified fracture of right femur, initial encounter for closed fracture: Secondary | ICD-10-CM | POA: Diagnosis not present

## 2022-12-20 DIAGNOSIS — F418 Other specified anxiety disorders: Secondary | ICD-10-CM | POA: Diagnosis not present

## 2022-12-20 DIAGNOSIS — E876 Hypokalemia: Secondary | ICD-10-CM | POA: Diagnosis present

## 2022-12-20 DIAGNOSIS — E7800 Pure hypercholesterolemia, unspecified: Secondary | ICD-10-CM | POA: Diagnosis present

## 2022-12-20 DIAGNOSIS — K219 Gastro-esophageal reflux disease without esophagitis: Secondary | ICD-10-CM | POA: Diagnosis present

## 2022-12-20 DIAGNOSIS — M1611 Unilateral primary osteoarthritis, right hip: Secondary | ICD-10-CM | POA: Diagnosis not present

## 2022-12-20 DIAGNOSIS — W19XXXA Unspecified fall, initial encounter: Secondary | ICD-10-CM | POA: Diagnosis not present

## 2022-12-20 DIAGNOSIS — Z7983 Long term (current) use of bisphosphonates: Secondary | ICD-10-CM | POA: Diagnosis not present

## 2022-12-20 DIAGNOSIS — S72091A Other fracture of head and neck of right femur, initial encounter for closed fracture: Secondary | ICD-10-CM | POA: Diagnosis present

## 2022-12-20 DIAGNOSIS — J188 Other pneumonia, unspecified organism: Secondary | ICD-10-CM | POA: Diagnosis present

## 2022-12-20 DIAGNOSIS — Z01818 Encounter for other preprocedural examination: Secondary | ICD-10-CM | POA: Diagnosis not present

## 2022-12-20 DIAGNOSIS — Z96641 Presence of right artificial hip joint: Secondary | ICD-10-CM | POA: Diagnosis not present

## 2022-12-20 DIAGNOSIS — N182 Chronic kidney disease, stage 2 (mild): Secondary | ICD-10-CM | POA: Diagnosis present

## 2022-12-20 DIAGNOSIS — Z79890 Hormone replacement therapy (postmenopausal): Secondary | ICD-10-CM | POA: Diagnosis not present

## 2022-12-20 DIAGNOSIS — W010XXA Fall on same level from slipping, tripping and stumbling without subsequent striking against object, initial encounter: Secondary | ICD-10-CM | POA: Diagnosis present

## 2022-12-20 LAB — BASIC METABOLIC PANEL
Anion gap: 13 (ref 5–15)
BUN: 27 mg/dL — ABNORMAL HIGH (ref 8–23)
CO2: 26 mmol/L (ref 22–32)
Calcium: 9.8 mg/dL (ref 8.9–10.3)
Chloride: 95 mmol/L — ABNORMAL LOW (ref 98–111)
Creatinine, Ser: 0.95 mg/dL (ref 0.44–1.00)
GFR, Estimated: 60 mL/min — ABNORMAL LOW (ref >60)
Glucose, Bld: 122 mg/dL — ABNORMAL HIGH (ref 70–99)
Potassium: 4.8 mmol/L (ref 3.5–5.1)
Sodium: 134 mmol/L — ABNORMAL LOW (ref 135–145)

## 2022-12-20 LAB — CBC WITH DIFFERENTIAL/PLATELET
Abs Immature Granulocytes: 0.05 10*3/uL (ref 0.00–0.07)
Basophils Absolute: 0 10*3/uL (ref 0.0–0.1)
Basophils Relative: 0 %
Eosinophils Absolute: 0 10*3/uL (ref 0.0–0.5)
Eosinophils Relative: 0 %
HCT: 36.7 % (ref 36.0–46.0)
Hemoglobin: 12.7 g/dL (ref 12.0–15.0)
Immature Granulocytes: 0 %
Lymphocytes Relative: 5 %
Lymphs Abs: 0.7 10*3/uL (ref 0.7–4.0)
MCH: 33.5 pg (ref 26.0–34.0)
MCHC: 34.6 g/dL (ref 30.0–36.0)
MCV: 96.8 fL (ref 80.0–100.0)
Monocytes Absolute: 0.8 10*3/uL (ref 0.1–1.0)
Monocytes Relative: 6 %
Neutro Abs: 12 10*3/uL — ABNORMAL HIGH (ref 1.7–7.7)
Neutrophils Relative %: 89 %
Platelets: 176 10*3/uL (ref 150–400)
RBC: 3.79 MIL/uL — ABNORMAL LOW (ref 3.87–5.11)
RDW: 12.8 % (ref 11.5–15.5)
WBC: 13.6 10*3/uL — ABNORMAL HIGH (ref 4.0–10.5)
nRBC: 0 % (ref 0.0–0.2)

## 2022-12-20 LAB — PROTIME-INR
INR: 1 (ref 0.8–1.2)
Prothrombin Time: 12.7 seconds (ref 11.4–15.2)

## 2022-12-20 MED ORDER — ENOXAPARIN SODIUM 40 MG/0.4ML IJ SOSY
40.0000 mg | PREFILLED_SYRINGE | INTRAMUSCULAR | Status: DC
  Administered 2022-12-20: 40 mg via SUBCUTANEOUS
  Filled 2022-12-20: qty 0.4

## 2022-12-20 MED ORDER — ONDANSETRON HCL 4 MG/2ML IJ SOLN
4.0000 mg | Freq: Three times a day (TID) | INTRAMUSCULAR | Status: DC | PRN
  Administered 2022-12-20: 4 mg via INTRAVENOUS
  Filled 2022-12-20: qty 2

## 2022-12-20 MED ORDER — LACTATED RINGERS IV SOLN
INTRAVENOUS | Status: DC
  Administered 2022-12-20: 1000 mL via INTRAVENOUS

## 2022-12-20 MED ORDER — MORPHINE SULFATE (PF) 2 MG/ML IV SOLN
2.0000 mg | INTRAVENOUS | Status: DC | PRN
  Administered 2022-12-21: 2 mg via INTRAVENOUS
  Filled 2022-12-20: qty 1

## 2022-12-20 MED ORDER — ONDANSETRON HCL 4 MG/2ML IJ SOLN
4.0000 mg | Freq: Once | INTRAMUSCULAR | Status: AC
  Administered 2022-12-20: 4 mg via INTRAVENOUS
  Filled 2022-12-20: qty 2

## 2022-12-20 MED ORDER — MORPHINE SULFATE (PF) 4 MG/ML IV SOLN
4.0000 mg | INTRAVENOUS | Status: DC | PRN
  Administered 2022-12-20 (×2): 4 mg via INTRAVENOUS
  Administered 2022-12-20: 2 mg via INTRAVENOUS
  Administered 2022-12-20: 4 mg via INTRAVENOUS
  Administered 2022-12-20: 2 mg via INTRAVENOUS
  Filled 2022-12-20 (×6): qty 1

## 2022-12-20 MED ORDER — HYDROCODONE-ACETAMINOPHEN 5-325 MG PO TABS
1.0000 | ORAL_TABLET | Freq: Four times a day (QID) | ORAL | Status: DC | PRN
  Administered 2022-12-20 (×2): 2 via ORAL
  Filled 2022-12-20 (×2): qty 2

## 2022-12-20 MED ORDER — MORPHINE SULFATE (PF) 4 MG/ML IV SOLN
4.0000 mg | INTRAVENOUS | Status: AC | PRN
  Administered 2022-12-20 (×2): 4 mg via INTRAVENOUS
  Filled 2022-12-20 (×2): qty 1

## 2022-12-20 NOTE — Anesthesia Preprocedure Evaluation (Addendum)
Anesthesia Evaluation  Patient identified by MRN, date of birth, ID band Patient awake    Reviewed: Allergy & Precautions, NPO status , Patient's Chart, lab work & pertinent test results  Airway Mallampati: II  TM Distance: >3 FB Neck ROM: Full    Dental no notable dental hx. (+) Dental Advisory Given   Pulmonary neg pulmonary ROS   Pulmonary exam normal breath sounds clear to auscultation       Cardiovascular hypertension, Pt. on medications Normal cardiovascular exam Rhythm:Regular Rate:Normal     Neuro/Psych  PSYCHIATRIC DISORDERS Anxiety Depression       GI/Hepatic ,GERD  ,,  Endo/Other  Hypothyroidism    Renal/GU Renal InsufficiencyRenal diseaseLab Results      Component                Value               Date                      CREATININE               0.95                12/20/2022                BUN                      27 (H)              12/20/2022                NA                       134 (L)             12/20/2022                K                        4.8                 12/20/2022                   Musculoskeletal  (+) Arthritis , Osteoarthritis,    Abdominal   Peds  Hematology Lab Results      Component                Value               Date                      WBC                      13.6 (H)            12/20/2022                HGB                      12.7                12/20/2022                HCT                      36.7  12/20/2022                       PLT                      176                 12/20/2022              Anesthesia Other Findings ALL: See list  Reproductive/Obstetrics                             Anesthesia Physical Anesthesia Plan  ASA: 3  Anesthesia Plan: Spinal   Post-op Pain Management: Regional block* and Minimal or no pain anticipated   Induction:   PONV Risk Score and Plan: 2 and Treatment may vary due to age  or medical condition and Propofol infusion  Airway Management Planned: Natural Airway and Nasal Cannula  Additional Equipment: None  Intra-op Plan:   Post-operative Plan:   Informed Consent: I have reviewed the patients History and Physical, chart, labs and discussed the procedure including the risks, benefits and alternatives for the proposed anesthesia with the patient or authorized representative who has indicated his/her understanding and acceptance.   Patient has DNR.   Dental advisory given  Plan Discussed with:   Anesthesia Plan Comments: (Spinal)       Anesthesia Quick Evaluation

## 2022-12-20 NOTE — Plan of Care (Signed)
   Patient Name: Brittany Douglas, kozinski DOB: 08-19-1941 MRN: [id number] Transferring facility: [facility] Requesting provider: glick, md Reason for transfer: right hip fracture 82 yo WF with mechanical fall, has right femoral neck fracture. EDP spoke with Dr. Raeanne Barry ortho). pt not on anticoagulation. advised EDP to keep pt NPO in case ortho case operate on her today. Going to: [facility] Admission Status: inpatient Bed Type: med/surg To Do:  [facility] will assume care on arrival to accepting facility. Until arrival, medical decision making responsibilities remain with the EDP.  However, [facility] available 24/7 for questions and assistance.   Nursing staff please page [place] and Consults [phone]) as soon as the patient arrives to the hospital.  Kristopher Oppenheim, DO [facility]

## 2022-12-20 NOTE — ED Provider Notes (Signed)
[place] EMERGENCY DEPT Provider Note   CSN: [id number] Arrival date & time: 12/19/22  2259     History  Chief Complaint  Patient presents with   [name]    Brittany Douglas is a 82 y.o. female.  The history is provided by the patient.  Fall  She has a history of hypertension, hyperlipidemia, and lost her balance at home and fell and is complaining of pain in her right hip.  She also suffered an abrasion to her right elbow.  She has been unable to bear weight since this.   Home Medications Prior to Admission medications   Medication Sig Start Date End Date Taking? Authorizing Provider  alendronate (FOSAMAX) 70 MG tablet Take 70 mg by mouth once a week. Take with a full glass of water on an empty stomach.    [provider]  amLODipine (NORVASC) 5 MG tablet Take 5 mg by mouth in the morning and at bedtime.    [provider]  aspirin 81 MG tablet Take 81 mg by mouth daily.    [provider]  CALCIUM PO Take by mouth.    [provider]  Cholecalciferol (VITAMIN D PO) Take 2,000 Int'l Units by mouth.    [provider]  Coenzyme Q10 (CO Q 10) 100 MG CAPS 1 capsule with a meal Orally Once a day    [provider]  conjugated estrogens (PREMARIN) vaginal cream INSERT 1/4 APPLICATOR VAGINALLY 2 TIMES A WEEK AS DIRECTED 11/25/22   Marny Lowenstein A, NP  diazepam (VALIUM) 2 MG tablet Take 2 mg by mouth every 6 (six) hours as needed for anxiety.    [provider]  diclofenac Sodium (VOLTAREN) 1 % GEL APPLY 2 GRAMS TO THE AFFECTED AREA(S) BY TOPICAL ROUTE 4 TIMES PER DAY 09/25/21   [provider]  Flaxseed, Linseed, (FLAX SEEDS) POWD Orally    [provider]  fluconazole (DIFLUCAN) 150 MG tablet Take 1 tablet (150 mg total) by mouth every 3 (three) days. 07/08/22   Tamela Gammon, NP  levothyroxine (SYNTHROID) 25 MCG tablet Take 25 mcg by mouth every morning. 05/22/22   [provider]  LORATADINE PO Take by mouth.    [provider]  losartan (COZAAR) 100 MG tablet 1 tablet Orally Once a day    [provider]  Multiple Vitamin (MULTIVITAMIN) tablet Take 1 tablet by mouth daily.    [provider]  Omega-3 Fatty Acids (FISH OIL) 1200 MG CAPS Take by mouth.    [provider]  Polyethylene Glycol 3350 (MIRALAX PO) Take by mouth.    [provider]  rosuvastatin (CRESTOR) 5 MG tablet Take 5 mg by mouth daily.    [provider]  sertraline (ZOLOFT) 100 MG tablet 1 tablet Orally Once a day    [provider]      Allergies    Nitrofurantoin, Ace inhibitors, Amlodipine, Codeine, Cymbalta [duloxetine hcl], Doxycycline, Gabapentin, Hctz [hydrochlorothiazide], Lisinopril, Penicillins, and Sulfa antibiotics    Review of Systems   Review of Systems  All other systems reviewed and are negative.   Physical Exam Updated Vital Signs BP (!) 145/65 (BP Location: Left Arm)   Pulse 71   Temp 98.1 F (36.7 C) (Oral)   Resp 16   Ht '5\' 2"'$  (1.575 m)   Wt 56.7 kg   SpO2 94%   BMI 22.86 kg/m  Physical Exam Vitals and nursing note reviewed.   82 year old female,  resting comfortably and in no acute distress. Vital signs are significant for mildly elevated blood pressure. Oxygen saturation is 94%, which is normal. Head is normocephalic and atraumatic. PERRLA, EOMI. Oropharynx is clear. Neck is nontender and supple without adenopathy or JVD. Back is nontender and there is no CVA tenderness. Lungs are clear without rales, wheezes, or rhonchi. Chest is nontender. Heart has regular rate and rhythm without murmur. Abdomen is soft, flat, nontender. Extremities: Right leg is shortened and slightly internally rotated.  There is tenderness palpation over the region of the right hip and marked pain with any passive movement of the right hip.  There is a minor abrasion present over the posterior aspect of the right  elbow, no significant swelling.  There is full range of motion of her right wrist and elbow without pain, full range of motion all other joints without pain. Skin is warm and dry without rash. Neurologic: Mental status is normal, cranial nerves are intact, no focal motor deficits identified.  ED Results / Procedures / Treatments   Labs (all labs ordered are listed, but only abnormal results are displayed) Labs Reviewed  BASIC METABOLIC PANEL - Abnormal; Notable for the following components:      Result Value   Sodium 134 (*)    Chloride 95 (*)    Glucose, Bld 122 (*)    BUN 27 (*)    GFR, Estimated 60 (*)    All other components within normal limits  CBC WITH DIFFERENTIAL/PLATELET - Abnormal; Notable for the following components:   WBC 13.6 (*)    RBC 3.79 (*)    Neutro Abs 12.0 (*)    All other components within normal limits  PROTIME-INR    EKG None  Radiology DG Chest Port 1 View  Result Date: 12/20/2022 CLINICAL DATA:  Fall.  Preoperative exam. EXAM: PORTABLE CHEST 1 VIEW COMPARISON:  07/26/2019. FINDINGS: The heart size and mediastinal contours are within normal limits. There is atherosclerotic calcification of the aorta. No consolidation, effusion, or pneumothorax. No acute osseous abnormality. IMPRESSION: No active disease. Electronically Signed   By: Brett Fairy M.D.   On: 12/20/2022 01:41   DG Hip Unilat  With Pelvis 2-3 Views Right  Result Date: 12/20/2022 CLINICAL DATA:  Status post fall. EXAM: DG HIP (WITH OR WITHOUT PELVIS) 2-3V RIGHT COMPARISON:  None Available. FINDINGS: An acute fracture deformity is seen extending through the neck of the proximal right femur. There is no evidence of dislocation. Moderate severity degenerative changes are seen in the form of joint space narrowing and acetabular sclerosis. IMPRESSION: Acute fracture of the proximal right femur. Electronically Signed   By: Virgina Norfolk M.D.   On: 12/20/2022 00:10   DG Wrist Complete  Right  Result Date: 12/20/2022 CLINICAL DATA:  Status post fall. EXAM: RIGHT WRIST - COMPLETE 3+ VIEW COMPARISON:  None Available. FINDINGS: There is no evidence of an acute fracture or dislocation. Marked severity degenerative changes are seen involving the first right carpometacarpal articulation. Soft tissues are unremarkable. IMPRESSION: Degenerative changes without evidence of an acute osseous abnormality. Electronically Signed   By: Virgina Norfolk M.D.   On: 12/20/2022 00:07   DG Elbow Complete Right  Result Date: 12/20/2022 CLINICAL DATA:  Status post fall. EXAM: RIGHT ELBOW - COMPLETE 3+ VIEW COMPARISON:  None Available. FINDINGS: There is no evidence of fracture, dislocation, or joint effusion. There is no evidence of arthropathy or other focal bone abnormality. Soft tissues are unremarkable. IMPRESSION: Negative. Electronically Signed  By: Virgina Norfolk M.D.   On: 12/20/2022 00:06    Procedures Procedures    Medications Ordered in ED Medications  morphine (PF) 4 MG/ML injection 4 mg (has no administration in time range)  ondansetron (ZOFRAN) injection 4 mg (has no administration in time range)    ED Course/ Medical Decision Making/ A&P                           Medical Decision Making Amount and/or Complexity of Data Reviewed Labs: ordered. Radiology: ordered.  Risk Prescription drug management. Decision regarding hospitalization.   Fall at home with injury to right hip concerning for fracture.  Minor abrasion noted right elbow.  Right wrist and elbow x-ray showed no evidence of fracture.  Right hip x-ray shows femoral neck fracture with angulation.  Have independently viewed the images, and agree with the radiologist's interpretation.  I have ordered morphine for pain, ondansetron for nausea.  I have ordered routine screening labs of CBC, basic metabolic panel.  I have ordered a preop chest x-ray and electrocardiogram.  Chest x-ray shows no acute cardiopulmonary  process.  Have independently viewed the image, and agree with radiologist interpretation.  I have reviewed and interpreted the electrocardiogram, and my interpretation is wandering atrial pacemaker, nonspecific ST and T changes which are slightly more pronounced than prior ECG.  I have reviewed and interpreted the laboratory test, and my interpretation is mild hyponatremia which is not clinically significant, mild elevation of BUN suggesting possible dehydration, mild leukocytosis which is felt to be stress related.  I have discussed the case with Dr. Rolena Infante of orthopedics who agrees to see the patient in consultation.  I have discussed case with Dr. Vallarie Mare of [facility], who agrees to admit the patient.  Final Clinical Impression(s) / ED Diagnoses Final diagnoses:  Closed fracture of neck of right femur, initial encounter ([place])  Hyponatremia    Rx / DC Orders ED Discharge Orders     None         Delora Fuel, MD 98/26/41 0214

## 2022-12-20 NOTE — ED Notes (Signed)
Pt. Placed on O2 at 3L/min when O2 sats dropped to mid 80's after receiving morphine '4mg'$  IV

## 2022-12-20 NOTE — ED Notes (Signed)
Pt given Morphine '2mg'$  ('4mg'$  ordered) prior to departure. IV infiltrated. New IV restarted. '2mg'$  given again prior to departure.

## 2022-12-20 NOTE — Plan of Care (Signed)
  Problem: Activity: Goal: Risk for activity intolerance will decrease Outcome: Progressing   Problem: Safety: Goal: Ability to remain free from injury will improve Outcome: Progressing   

## 2022-12-20 NOTE — Consult Note (Signed)
Reason for Consult: Right femoral neck fracture Referring Physician: EDP  HPI: Brittany Douglas is an 82 y.o. female with a number of underlying chronic medical conditions as documented in the recent hospitalist service history and physical, status post ground-level fall with complaints of right hip pain and inability to bear weight.  She was initially evaluated at the [facility] and upon findings of right femoral neck fracture has been transferred to [facility] for definitive management.  Past Medical History:  Diagnosis Date   Anxiety    Aortic atherosclerosis (HCC)    Avascular necrosis of femur, right (HCC)    Balance problems    Chronic kidney disease, stage II (mild)    DDD (degenerative disc disease), lumbosacral    Depression    DJD (degenerative joint disease), lumbar    Elevated cholesterol    GERD (gastroesophageal reflux disease)    HOH (hard of hearing)    Hypertension    Hypertensive renal disease, benign    Hypokalemia    Hyponatremia    Hypothyroidism    IBS (irritable bowel syndrome)    Major depression, recurrent (HCC)    Osteopenia    Osteoporosis    Post-menopausal    Reflux    Subclinical hypothyroidism    Varicose vein of leg    Vitamin D deficiency     Past Surgical History:  Procedure Laterality Date   BREAST EXCISIONAL BIOPSY Bilateral    BREAST SURGERY     Benign breast lumps   CATARACT EXTRACTION     CHOLECYSTECTOMY     FOOT SURGERY     VAGINAL HYSTERECTOMY     A & P repair    Family History  Problem Relation Age of Onset   Stroke Mother    Hypertension Mother    Uterine cancer Mother    Stroke Father    Heart attack Brother    Stroke Brother    Diabetes Maternal Grandmother    Heart attack Paternal Grandmother    Breast cancer Maternal Aunt    Hyperlipidemia Sister    Thyroid disease Sister    Colon polyps Daughter    Colon polyps Son    Other Grandson        NEOFIBROTOSIS    Social History:   reports that she has never smoked. She has never used smokeless tobacco. She reports that she does not drink alcohol and does not use drugs.  Allergies:  Allergies  Allergen Reactions   Nitrofurantoin     Upset stomach   Ace Inhibitors    Amlodipine    Codeine Nausea And Vomiting   Cymbalta [Duloxetine Hcl]    Doxycycline    Gabapentin Nausea Only   Hctz [Hydrochlorothiazide]    Lisinopril     cough   Penicillins Hives   Sulfa Antibiotics Rash    Medications: Prior to Admission:  Medications Prior to Admission  Medication Sig Dispense Refill Last Dose   alendronate (FOSAMAX) 70 MG tablet Take 70 mg by mouth once a week. Take with a full glass of water on an empty stomach.      amLODipine (NORVASC) 5 MG tablet Take 5 mg by mouth in the morning and at bedtime.      aspirin 81 MG tablet Take 81 mg by mouth daily.      CALCIUM PO Take by mouth.      Cholecalciferol (VITAMIN D PO) Take 2,000 Int'l Units by mouth.      Coenzyme Q10 (CO  Q 10) 100 MG CAPS 1 capsule with a meal Orally Once a day      conjugated estrogens (PREMARIN) vaginal cream INSERT 1/4 APPLICATOR VAGINALLY 2 TIMES A WEEK AS DIRECTED 30 g 1    diazepam (VALIUM) 2 MG tablet Take 2 mg by mouth every 6 (six) hours as needed for anxiety.      diclofenac Sodium (VOLTAREN) 1 % GEL APPLY 2 GRAMS TO THE AFFECTED AREA(S) BY TOPICAL ROUTE 4 TIMES PER DAY      Flaxseed, Linseed, (FLAX SEEDS) POWD Orally      fluconazole (DIFLUCAN) 150 MG tablet Take 1 tablet (150 mg total) by mouth every 3 (three) days. 2 tablet 0    levothyroxine (SYNTHROID) 25 MCG tablet Take 25 mcg by mouth every morning.      LORATADINE PO Take by mouth.      losartan (COZAAR) 100 MG tablet 1 tablet Orally Once a day      Multiple Vitamin (MULTIVITAMIN) tablet Take 1 tablet by mouth daily.      Omega-3 Fatty Acids (FISH OIL) 1200 MG CAPS Take by mouth.      Polyethylene Glycol 3350 (MIRALAX PO) Take by mouth.      rosuvastatin (CRESTOR) 5 MG tablet Take 5  mg by mouth daily.      sertraline (ZOLOFT) 100 MG tablet 1 tablet Orally Once a day       Results for orders placed or performed during the hospital encounter of 12/20/22 (from the past 48 hour(s))  Basic metabolic panel     Status: Abnormal   Collection Time: 12/20/22 12:45 AM  Result Value Ref Range   Sodium 134 (L) 135 - 145 mmol/L   Potassium 4.8 3.5 - 5.1 mmol/L   Chloride 95 (L) 98 - 111 mmol/L   CO2 26 22 - 32 mmol/L   Glucose, Bld 122 (H) 70 - 99 mg/dL    Comment: Glucose reference range applies only to samples taken after fasting for at least 8 hours.   BUN 27 (H) 8 - 23 mg/dL   Creatinine, Ser 0.95 0.44 - 1.00 mg/dL   Calcium 9.8 8.9 - 10.3 mg/dL   GFR, Estimated 60 (L) >60 mL/min    Comment: (NOTE) Calculated using the CKD-EPI Creatinine Equation (2021)    Anion gap 13 5 - 15    Comment: Performed at [facility], [address], [place], [place] [address]  CBC WITH DIFFERENTIAL     Status: Abnormal   Collection Time: 12/20/22 12:45 AM  Result Value Ref Range   WBC 13.6 (H) 4.0 - 10.5 K/uL   RBC 3.79 (L) 3.87 - 5.11 MIL/uL   Hemoglobin 12.7 12.0 - 15.0 g/dL   HCT 36.7 36.0 - 46.0 %   MCV 96.8 80.0 - 100.0 fL   MCH 33.5 26.0 - 34.0 pg   MCHC 34.6 30.0 - 36.0 g/dL   RDW 12.8 11.5 - 15.5 %   Platelets 176 150 - 400 K/uL   nRBC 0.0 0.0 - 0.2 %   Neutrophils Relative % 89 %   Neutro Abs 12.0 (H) 1.7 - 7.7 K/uL   Lymphocytes Relative 5 %   Lymphs Abs 0.7 0.7 - 4.0 K/uL   Monocytes Relative 6 %   Monocytes Absolute 0.8 0.1 - 1.0 K/uL   Eosinophils Relative 0 %   Eosinophils Absolute 0.0 0.0 - 0.5 K/uL   Basophils Relative 0 %   Basophils Absolute 0.0 0.0 - 0.1 K/uL   Immature  Granulocytes 0 %   Abs Immature Granulocytes 0.05 0.00 - 0.07 K/uL    Comment: Performed at [facility], [address], [place], [place] [address]  Protime-INR     Status: None   Collection Time: 12/20/22 12:45 AM  Result Value Ref Range    Prothrombin Time 12.7 11.4 - 15.2 seconds   INR 1.0 0.8 - 1.2    Comment: (NOTE) INR goal varies based on device and disease states. Performed at [facility], [address], [place], [place] [address]     DG Chest [place] 1 View  Result Date: 12/20/2022 CLINICAL DATA:  Fall.  Preoperative exam. EXAM: PORTABLE CHEST 1 VIEW COMPARISON:  07/26/2019. FINDINGS: The heart size and mediastinal contours are within normal limits. There is atherosclerotic calcification of the aorta. No consolidation, effusion, or pneumothorax. No acute osseous abnormality. IMPRESSION: No active disease. Electronically Signed   By: Brett Fairy M.D.   On: 12/20/2022 01:41   DG Hip Unilat  With Pelvis 2-3 Views Right  Result Date: 12/20/2022 CLINICAL DATA:  Status post fall. EXAM: DG HIP (WITH OR WITHOUT PELVIS) 2-3V RIGHT COMPARISON:  None Available. FINDINGS: An acute fracture deformity is seen extending through the neck of the proximal right femur. There is no evidence of dislocation. Moderate severity degenerative changes are seen in the form of joint space narrowing and acetabular sclerosis. IMPRESSION: Acute fracture of the proximal right femur. Electronically Signed   By: Virgina Norfolk M.D.   On: 12/20/2022 00:10   DG Wrist Complete Right  Result Date: 12/20/2022 CLINICAL DATA:  Status post fall. EXAM: RIGHT WRIST - COMPLETE 3+ VIEW COMPARISON:  None Available. FINDINGS: There is no evidence of an acute fracture or dislocation. Marked severity degenerative changes are seen involving the first right carpometacarpal articulation. Soft tissues are unremarkable. IMPRESSION: Degenerative changes without evidence of an acute osseous abnormality. Electronically Signed   By: Virgina Norfolk M.D.   On: 12/20/2022 00:07   DG Elbow Complete Right  Result Date: 12/20/2022 CLINICAL DATA:  Status post fall. EXAM: RIGHT ELBOW - COMPLETE 3+ VIEW COMPARISON:  None Available. FINDINGS: There is no evidence of  fracture, dislocation, or joint effusion. There is no evidence of arthropathy or other focal bone abnormality. Soft tissues are unremarkable. IMPRESSION: Negative. Electronically Signed   By: Virgina Norfolk M.D.   On: 12/20/2022 00:06     Vitals Temp:  [98.1 F (36.7 C)-98.4 F (36.9 C)] 98.4 F (36.9 C) (01/06 1400) Pulse Rate:  [46-90] 76 (01/06 1400) Resp:  [10-19] 14 (01/06 1400) BP: (127-147)/(48-67) 137/48 (01/06 1400) SpO2:  [93 %-98 %] 96 % (01/06 1400) Weight:  [56.7 kg] 56.7 kg (01/05 2321) Body mass index is 22.86 kg/m.  Physical Exam: Patient is an elderly white female sitting comfortably in her hospital bed reporting some mild to moderate right hip pain.  Daughter is at the bedside.  She is normocephalic with no obvious facial trauma.  She is nontender with palpation over the shoulder girdles and upper extremities with good grip strength.  Left lower extremity shows no gross bone or joint instability.  Right lower extremity demonstrates severe pain with attempts at right hip motion.  Compartments are soft.  She is grossly neurovascular intact both lower extremities.  Plain films of the right hip show a right femoral neck fracture with moderate displacement     Assessment/Plan: Impression:  Right femoral neck fracture Treatment:  I have counseled the patient and her daughter regarding treatment options.  I have reached out to Dr. Adriana Mccallum who is on-call for our group tomorrow and I am hopeful that he will be available for surgical management.  At this time plan for n.p.o. after midnight.  Further surgical details per Dr. Arminda Resides M Kilyn Maragh 12/20/2022, 5:05 PM  Contact # [phone]

## 2022-12-20 NOTE — H&P (Signed)
History and Physical    Patient: Brittany Douglas DXI:[id number] DOB: 01/31/1941 DOA: 12/20/2022 DOS: the patient was seen and examined on 12/20/2022 PCP: [name], MD  Patient coming from: Home  Chief Complaint:  Chief Complaint  Patient presents with   Fall   HPI: Brittany Douglas is a 82 y.o. female with medical history significant of hypothyroidism, HTN, anxiety/depression, HLD. Presenting with right hip pain. History is from daughter at bedside. She reports that last night the patient was in her bathroom trying clean up when she lost her balance and fell to the floor. She hit her right side. She did not hit her head. She did not suffer LOC. She was unable to get up on her own. Her family was able to assist her up a few minutes after her fall. She found it difficult to bear weight on her right leg. Her family had her brought to the ED for evaluation. She denies any other aggravating or alleviating factors.   Review of Systems: As mentioned in the history of present illness. All other systems reviewed and are negative. Past Medical History:  Diagnosis Date   Anxiety    Aortic atherosclerosis (HCC)    Avascular necrosis of femur, right (HCC)    Balance problems    Chronic kidney disease, stage II (mild)    DDD (degenerative disc disease), lumbosacral    Depression    DJD (degenerative joint disease), lumbar    Elevated cholesterol    GERD (gastroesophageal reflux disease)    HOH (hard of hearing)    Hypertension    Hypertensive renal disease, benign    Hypokalemia    Hyponatremia    Hypothyroidism    IBS (irritable bowel syndrome)    Major depression, recurrent (HCC)    Osteopenia    Osteoporosis    Post-menopausal    Reflux    Subclinical hypothyroidism    Varicose vein of leg    Vitamin D deficiency    Past Surgical History:  Procedure Laterality Date   BREAST EXCISIONAL BIOPSY Bilateral    BREAST SURGERY     Benign breast lumps   CATARACT EXTRACTION      CHOLECYSTECTOMY     FOOT SURGERY     VAGINAL HYSTERECTOMY     A & P repair   Social History:  reports that she has never smoked. She has never used smokeless tobacco. She reports that she does not drink alcohol and does not use drugs.  Allergies  Allergen Reactions   Nitrofurantoin     Upset stomach   Ace Inhibitors    Amlodipine    Codeine Nausea And Vomiting   Cymbalta [Duloxetine Hcl]    Doxycycline    Gabapentin Nausea Only   Hctz [Hydrochlorothiazide]    Lisinopril     cough   Penicillins Hives   Sulfa Antibiotics Rash    Family History  Problem Relation Age of Onset   Stroke Mother    Hypertension Mother    Uterine cancer Mother    Stroke Father    Heart attack Brother    Stroke Brother    Diabetes Maternal Grandmother    Heart attack Paternal Grandmother    Breast cancer Maternal Aunt    Hyperlipidemia Sister    Thyroid disease Sister    Colon polyps Daughter    Colon polyps Son    Other Grandson        NEOFIBROTOSIS    Prior to Admission medications   Medication Sig  Start Date End Date Taking? Authorizing Provider  alendronate (FOSAMAX) 70 MG tablet Take 70 mg by mouth once a week. Take with a full glass of water on an empty stomach.    [provider]  amLODipine (NORVASC) 5 MG tablet Take 5 mg by mouth in the morning and at bedtime.    [provider]  aspirin 81 MG tablet Take 81 mg by mouth daily.    [provider]  CALCIUM PO Take by mouth.    [provider]  Cholecalciferol (VITAMIN D PO) Take 2,000 Int'l Units by mouth.    [provider]  Coenzyme Q10 (CO Q 10) 100 MG CAPS 1 capsule with a meal Orally Once a day    [provider]  conjugated estrogens (PREMARIN) vaginal cream INSERT 1/4 APPLICATOR VAGINALLY 2 TIMES A WEEK AS DIRECTED 11/25/22   Marny Lowenstein A, NP  diazepam (VALIUM) 2 MG tablet Take 2 mg by mouth every 6 (six) hours as needed for anxiety.    [provider]   diclofenac Sodium (VOLTAREN) 1 % GEL APPLY 2 GRAMS TO THE AFFECTED AREA(S) BY TOPICAL ROUTE 4 TIMES PER DAY 09/25/21   [provider]  Flaxseed, Linseed, (FLAX SEEDS) POWD Orally    [provider]  fluconazole (DIFLUCAN) 150 MG tablet Take 1 tablet (150 mg total) by mouth every 3 (three) days. 07/08/22   Tamela Gammon, NP  levothyroxine (SYNTHROID) 25 MCG tablet Take 25 mcg by mouth every morning. 05/22/22   [provider]  LORATADINE PO Take by mouth.    [provider]  losartan (COZAAR) 100 MG tablet 1 tablet Orally Once a day    [provider]  Multiple Vitamin (MULTIVITAMIN) tablet Take 1 tablet by mouth daily.    [provider]  Omega-3 Fatty Acids (FISH OIL) 1200 MG CAPS Take by mouth.    [provider]  Polyethylene Glycol 3350 (MIRALAX PO) Take by mouth.    [provider]  rosuvastatin (CRESTOR) 5 MG tablet Take 5 mg by mouth daily.    [provider]  sertraline (ZOLOFT) 100 MG tablet 1 tablet Orally Once a day    [provider]    Physical Exam: Vitals:   12/20/22 1230 12/20/22 1245 12/20/22 1300 12/20/22 1400  BP:   (!) 145/54 (!) 137/48  Pulse: 80 74 78 76  Resp: '14 15 14 14  '$ Temp:    98.4 F (36.9 C)  TempSrc:    Oral  SpO2: 96% 94% 93% 96%  Weight:      Height:       General: 82 y.o. female resting in bed in NAD Eyes: PERRL, normal sclera ENMT: Nares patent w/o discharge, orophaynx clear, dentition normal, ears w/o discharge/lesions/ulcers Neck: Supple, trachea midline Cardiovascular: RRR, +S1, S2, no m/g/r, equal pulses throughout Respiratory: CTABL, no w/r/r, normal WOB GI: BS+, NDNT, no masses noted, no organomegaly noted MSK: No e/c/c; limited ROM right hip d/t pain Neuro: A&O x 3, other than HoH no focal deficits Psyc: Appropriate interaction and affect, calm/cooperative  Data Reviewed:  Results for orders placed or performed during the hospital encounter of  12/20/22 (from the past 24 hour(s))  Basic metabolic panel     Status: Abnormal   Collection Time: 12/20/22 12:45 AM  Result Value Ref Range   Sodium 134 (L) 135 - 145 mmol/L   Potassium 4.8 3.5 - 5.1 mmol/L   Chloride 95 (L) 98 - 111 mmol/L  CO2 26 22 - 32 mmol/L   Glucose, Bld 122 (H) 70 - 99 mg/dL   BUN 27 (H) 8 - 23 mg/dL   Creatinine, Ser 0.95 0.44 - 1.00 mg/dL   Calcium 9.8 8.9 - 10.3 mg/dL   GFR, Estimated 60 (L) >60 mL/min   Anion gap 13 5 - 15  CBC WITH DIFFERENTIAL     Status: Abnormal   Collection Time: 12/20/22 12:45 AM  Result Value Ref Range   WBC 13.6 (H) 4.0 - 10.5 K/uL   RBC 3.79 (L) 3.87 - 5.11 MIL/uL   Hemoglobin 12.7 12.0 - 15.0 g/dL   HCT 36.7 36.0 - 46.0 %   MCV 96.8 80.0 - 100.0 fL   MCH 33.5 26.0 - 34.0 pg   MCHC 34.6 30.0 - 36.0 g/dL   RDW 12.8 11.5 - 15.5 %   Platelets 176 150 - 400 K/uL   nRBC 0.0 0.0 - 0.2 %   Neutrophils Relative % 89 %   Neutro Abs 12.0 (H) 1.7 - 7.7 K/uL   Lymphocytes Relative 5 %   Lymphs Abs 0.7 0.7 - 4.0 K/uL   Monocytes Relative 6 %   Monocytes Absolute 0.8 0.1 - 1.0 K/uL   Eosinophils Relative 0 %   Eosinophils Absolute 0.0 0.0 - 0.5 K/uL   Basophils Relative 0 %   Basophils Absolute 0.0 0.0 - 0.1 K/uL   Immature Granulocytes 0 %   Abs Immature Granulocytes 0.05 0.00 - 0.07 K/uL  Protime-INR     Status: None   Collection Time: 12/20/22 12:45 AM  Result Value Ref Range   Prothrombin Time 12.7 11.4 - 15.2 seconds   INR 1.0 0.8 - 1.2   CXR: No active disease.   XR Right elbow Negative.   XR right wrist Degenerative changes without evidence of an acute osseous abnormality.  XR right hip Acute fracture of the proximal right femur.   Assessment and Plan: Acute right femur fracture Fall     - admitted to inpt, med-surg     - [facility] onboard, appreciate assistance, no surgery today     - will have NPOpMN in case procedure scheduled for tomorrow     - HipFx orderset     - PT/OT after  procedure  Hypothyroidism     - continue home regimen when confirmed  HTN     - continue home regimen when confirmed  Anxiety Depression     - continue home regimen when confirmed  HLD     - continue home regimen when confirmed  Advance Care Planning:   Code Status: DNR confirmed w/ dtr at bedside  Consults: [facility]  Family Communication: w/ dtr at bedside  Severity of Illness: The appropriate patient status for this patient is INPATIENT. Inpatient status is judged to be reasonable and necessary in order to provide the required intensity of service to ensure the patient's safety. The patient's presenting symptoms, physical exam findings, and initial radiographic and laboratory data in the context of their chronic comorbidities is felt to place them at high risk for further clinical deterioration. Furthermore, it is not anticipated that the patient will be medically stable for discharge from the hospital within 2 midnights of admission.   * I certify that at the point of admission it is my clinical judgment that the patient will require inpatient hospital care spanning beyond 2 midnights from the point of admission due to high intensity of service, high risk for further deterioration and high  frequency of surveillance required.*  Author: Jonnie Finner, DO 12/20/2022 2:33 PM  For on call review [email or web address].

## 2022-12-21 ENCOUNTER — Inpatient Hospital Stay (HOSPITAL_COMMUNITY): Payer: PPO

## 2022-12-21 ENCOUNTER — Encounter (HOSPITAL_COMMUNITY)
Admission: EM | Disposition: A | Discharge status: Skilled Nursing Facility | Payer: Self-pay | Source: Home / Self Care | Attending: Family Medicine

## 2022-12-21 ENCOUNTER — Inpatient Hospital Stay (HOSPITAL_COMMUNITY): Payer: PPO | Admitting: Anesthesiology

## 2022-12-21 ENCOUNTER — Other Ambulatory Visit: Payer: Self-pay

## 2022-12-21 DIAGNOSIS — S72001A Fracture of unspecified part of neck of right femur, initial encounter for closed fracture: Secondary | ICD-10-CM | POA: Diagnosis not present

## 2022-12-21 DIAGNOSIS — M1611 Unilateral primary osteoarthritis, right hip: Secondary | ICD-10-CM

## 2022-12-21 DIAGNOSIS — F418 Other specified anxiety disorders: Secondary | ICD-10-CM

## 2022-12-21 DIAGNOSIS — I10 Essential (primary) hypertension: Secondary | ICD-10-CM

## 2022-12-21 DIAGNOSIS — Z96641 Presence of right artificial hip joint: Secondary | ICD-10-CM

## 2022-12-21 HISTORY — PX: TOTAL HIP ARTHROPLASTY: SHX124

## 2022-12-21 SURGERY — ARTHROPLASTY, HIP, TOTAL, ANTERIOR APPROACH
Anesthesia: Spinal | Site: Hip | Laterality: Right

## 2022-12-21 MED ORDER — ADULT MULTIVITAMIN W/MINERALS CH
1.0000 | ORAL_TABLET | Freq: Every day | ORAL | Status: DC
  Filled 2022-12-21: qty 1

## 2022-12-21 MED ORDER — ONDANSETRON HCL 4 MG PO TABS: 4.0000 mg | ORAL_TABLET | Freq: Four times a day (QID) | ORAL | Status: DC | PRN

## 2022-12-21 MED ORDER — CHLORHEXIDINE GLUCONATE 4 % EX LIQD: 60.0000 mL | Freq: Once | CUTANEOUS | Status: DC

## 2022-12-21 MED ORDER — FENTANYL CITRATE PF 50 MCG/ML IJ SOSY: 25.0000 ug | PREFILLED_SYRINGE | INTRAMUSCULAR | Status: DC | PRN

## 2022-12-21 MED ORDER — DIPHENHYDRAMINE HCL 12.5 MG/5ML PO ELIX: 12.5000 mg | ORAL_SOLUTION | ORAL | Status: DC | PRN

## 2022-12-21 MED ORDER — BISACODYL 10 MG RE SUPP: 10.0000 mg | Freq: Every day | RECTAL | Status: DC | PRN

## 2022-12-21 MED ORDER — TRANEXAMIC ACID-NACL 1000-0.7 MG/100ML-% IV SOLN
INTRAVENOUS | Status: AC
  Filled 2022-12-21: qty 100

## 2022-12-21 MED ORDER — PROPOFOL 1000 MG/100ML IV EMUL
INTRAVENOUS | Status: AC
  Filled 2022-12-21: qty 100

## 2022-12-21 MED ORDER — DEXAMETHASONE SODIUM PHOSPHATE 10 MG/ML IJ SOLN
10.0000 mg | Freq: Once | INTRAMUSCULAR | Status: AC
  Administered 2022-12-22: 10 mg via INTRAVENOUS
  Filled 2022-12-21: qty 1

## 2022-12-21 MED ORDER — DIAZEPAM 2 MG PO TABS
2.0000 mg | ORAL_TABLET | Freq: Four times a day (QID) | ORAL | Status: DC | PRN
  Administered 2022-12-21 – 2022-12-23 (×3): 2 mg via ORAL
  Filled 2022-12-21 (×3): qty 1

## 2022-12-21 MED ORDER — AMLODIPINE BESYLATE 5 MG PO TABS
5.0000 mg | ORAL_TABLET | Freq: Every day | ORAL | Status: DC
  Filled 2022-12-21: qty 1

## 2022-12-21 MED ORDER — CEFAZOLIN SODIUM-DEXTROSE 2-4 GM/100ML-% IV SOLN
2.0000 g | Freq: Four times a day (QID) | INTRAVENOUS | Status: AC
  Administered 2022-12-21 (×2): 2 g via INTRAVENOUS
  Filled 2022-12-21 (×2): qty 100

## 2022-12-21 MED ORDER — KETAMINE HCL 50 MG/5ML IJ SOSY
PREFILLED_SYRINGE | INTRAMUSCULAR | Status: AC
  Filled 2022-12-21: qty 5

## 2022-12-21 MED ORDER — DOCUSATE SODIUM 100 MG PO CAPS
100.0000 mg | ORAL_CAPSULE | Freq: Two times a day (BID) | ORAL | Status: DC
  Administered 2022-12-21 – 2022-12-25 (×8): 100 mg via ORAL
  Filled 2022-12-21 (×8): qty 1

## 2022-12-21 MED ORDER — DEXAMETHASONE SODIUM PHOSPHATE 10 MG/ML IJ SOLN
INTRAMUSCULAR | Status: AC
  Filled 2022-12-21: qty 1

## 2022-12-21 MED ORDER — POVIDONE-IODINE 10 % EX SWAB: 2.0000 | Freq: Once | CUTANEOUS | Status: DC

## 2022-12-21 MED ORDER — SODIUM CHLORIDE (PF) 0.9 % IJ SOLN
INTRAMUSCULAR | Status: AC
  Filled 2022-12-21: qty 50

## 2022-12-21 MED ORDER — SODIUM CHLORIDE (PF) 0.9 % IJ SOLN
INTRAMUSCULAR | Status: DC | PRN
  Administered 2022-12-21: 30 mL

## 2022-12-21 MED ORDER — HYDROCODONE-ACETAMINOPHEN 5-325 MG PO TABS
1.0000 | ORAL_TABLET | ORAL | Status: DC | PRN
  Administered 2022-12-21 – 2022-12-23 (×7): 2 via ORAL
  Filled 2022-12-21 (×7): qty 2

## 2022-12-21 MED ORDER — TRANEXAMIC ACID-NACL 1000-0.7 MG/100ML-% IV SOLN
1000.0000 mg | Freq: Once | INTRAVENOUS | Status: AC
  Administered 2022-12-21: 1000 mg via INTRAVENOUS
  Filled 2022-12-21: qty 100

## 2022-12-21 MED ORDER — PHENYLEPHRINE HCL (PRESSORS) 10 MG/ML IV SOLN
INTRAVENOUS | Status: AC
  Filled 2022-12-21: qty 1

## 2022-12-21 MED ORDER — MORPHINE SULFATE (PF) 2 MG/ML IV SOLN: 0.5000 mg | INTRAVENOUS | Status: DC | PRN

## 2022-12-21 MED ORDER — METOCLOPRAMIDE HCL 5 MG/ML IJ SOLN: 5.0000 mg | Freq: Three times a day (TID) | INTRAMUSCULAR | Status: DC | PRN

## 2022-12-21 MED ORDER — BUPIVACAINE HCL (PF) 0.5 % IJ SOLN
INTRAMUSCULAR | Status: DC | PRN
  Administered 2022-12-21: 12 mg via INTRATHECAL

## 2022-12-21 MED ORDER — METOCLOPRAMIDE HCL 5 MG PO TABS: 5.0000 mg | ORAL_TABLET | Freq: Three times a day (TID) | ORAL | Status: DC | PRN

## 2022-12-21 MED ORDER — CEFAZOLIN SODIUM-DEXTROSE 2-4 GM/100ML-% IV SOLN
INTRAVENOUS | Status: AC
  Filled 2022-12-21: qty 100

## 2022-12-21 MED ORDER — BUPIVACAINE HCL (PF) 0.25 % IJ SOLN
INTRAMUSCULAR | Status: AC
  Filled 2022-12-21: qty 30

## 2022-12-21 MED ORDER — MENTHOL 3 MG MT LOZG: 1.0000 | LOZENGE | OROMUCOSAL | Status: DC | PRN

## 2022-12-21 MED ORDER — CEFAZOLIN SODIUM-DEXTROSE 2-4 GM/100ML-% IV SOLN: 2.0000 g | INTRAVENOUS | Status: DC

## 2022-12-21 MED ORDER — ONDANSETRON HCL 4 MG/2ML IJ SOLN: 4.0000 mg | Freq: Once | INTRAMUSCULAR | Status: DC | PRN

## 2022-12-21 MED ORDER — METHOCARBAMOL 1000 MG/10ML IJ SOLN: 500.0000 mg | Freq: Four times a day (QID) | INTRAVENOUS | Status: DC | PRN

## 2022-12-21 MED ORDER — ACETAMINOPHEN 10 MG/ML IV SOLN
INTRAVENOUS | Status: AC
  Filled 2022-12-21: qty 100

## 2022-12-21 MED ORDER — SODIUM CHLORIDE 0.9 % IV SOLN: INTRAVENOUS | Status: DC

## 2022-12-21 MED ORDER — LEVOTHYROXINE SODIUM 25 MCG PO TABS
25.0000 ug | ORAL_TABLET | Freq: Every day | ORAL | Status: DC
  Administered 2022-12-22 – 2022-12-25 (×4): 25 ug via ORAL
  Filled 2022-12-21 (×4): qty 1

## 2022-12-21 MED ORDER — ONDANSETRON HCL 4 MG/2ML IJ SOLN: 4.0000 mg | Freq: Four times a day (QID) | INTRAMUSCULAR | Status: DC | PRN

## 2022-12-21 MED ORDER — ONDANSETRON HCL 4 MG/2ML IJ SOLN
INTRAMUSCULAR | Status: DC | PRN
  Administered 2022-12-21: 4 mg via INTRAVENOUS

## 2022-12-21 MED ORDER — LOSARTAN POTASSIUM 50 MG PO TABS
100.0000 mg | ORAL_TABLET | Freq: Every day | ORAL | Status: DC
  Administered 2022-12-22 – 2022-12-25 (×4): 100 mg via ORAL
  Filled 2022-12-21 (×5): qty 2

## 2022-12-21 MED ORDER — PHENOL 1.4 % MT LIQD
1.0000 | OROMUCOSAL | Status: DC | PRN
  Administered 2022-12-21: 1 via OROMUCOSAL
  Filled 2022-12-21: qty 177

## 2022-12-21 MED ORDER — ONDANSETRON HCL 4 MG/2ML IJ SOLN
INTRAMUSCULAR | Status: AC
  Filled 2022-12-21: qty 2

## 2022-12-21 MED ORDER — SERTRALINE HCL 100 MG PO TABS
100.0000 mg | ORAL_TABLET | Freq: Every day | ORAL | Status: DC
  Administered 2022-12-22 – 2022-12-25 (×4): 100 mg via ORAL
  Filled 2022-12-21 (×5): qty 1

## 2022-12-21 MED ORDER — KETOROLAC TROMETHAMINE 30 MG/ML IJ SOLN
INTRAMUSCULAR | Status: DC | PRN
  Administered 2022-12-21: 30 mg

## 2022-12-21 MED ORDER — ENSURE ENLIVE PO LIQD
237.0000 mL | Freq: Two times a day (BID) | ORAL | Status: DC
  Administered 2022-12-22 – 2022-12-24 (×6): 237 mL via ORAL

## 2022-12-21 MED ORDER — ASPIRIN 81 MG PO CHEW
81.0000 mg | CHEWABLE_TABLET | Freq: Two times a day (BID) | ORAL | Status: DC
  Administered 2022-12-21 – 2022-12-25 (×8): 81 mg via ORAL
  Filled 2022-12-21 (×8): qty 1

## 2022-12-21 MED ORDER — TRANEXAMIC ACID-NACL 1000-0.7 MG/100ML-% IV SOLN: 1000.0000 mg | INTRAVENOUS | Status: DC

## 2022-12-21 MED ORDER — PROPOFOL 10 MG/ML IV BOLUS
INTRAVENOUS | Status: AC
  Filled 2022-12-21: qty 20

## 2022-12-21 MED ORDER — BUPIVACAINE-EPINEPHRINE (PF) 0.25% -1:200000 IJ SOLN
INTRAMUSCULAR | Status: DC | PRN
  Administered 2022-12-21: 30 mL

## 2022-12-21 MED ORDER — STERILE WATER FOR IRRIGATION IR SOLN
Status: DC | PRN
  Administered 2022-12-21: 2000 mL

## 2022-12-21 MED ORDER — 0.9 % SODIUM CHLORIDE (POUR BTL) OPTIME
TOPICAL | Status: DC | PRN
  Administered 2022-12-21: 1000 mL

## 2022-12-21 MED ORDER — PROPOFOL 500 MG/50ML IV EMUL
INTRAVENOUS | Status: DC | PRN
  Administered 2022-12-21: 50 ug/kg/min via INTRAVENOUS

## 2022-12-21 MED ORDER — ACETAMINOPHEN 10 MG/ML IV SOLN
INTRAVENOUS | Status: DC | PRN
  Administered 2022-12-21: 1000 mg via INTRAVENOUS

## 2022-12-21 MED ORDER — POLYETHYLENE GLYCOL 3350 17 G PO PACK
17.0000 g | PACK | Freq: Two times a day (BID) | ORAL | Status: DC
  Administered 2022-12-21 – 2022-12-25 (×6): 17 g via ORAL
  Filled 2022-12-21 (×8): qty 1

## 2022-12-21 MED ORDER — ACETAMINOPHEN 10 MG/ML IV SOLN: 1000.0000 mg | Freq: Once | INTRAVENOUS | Status: DC | PRN

## 2022-12-21 MED ORDER — LACTATED RINGERS IV SOLN: INTRAVENOUS | Status: DC

## 2022-12-21 MED ORDER — ADULT MULTIVITAMIN W/MINERALS CH
1.0000 | ORAL_TABLET | Freq: Every day | ORAL | Status: DC
  Administered 2022-12-22 – 2022-12-25 (×4): 1 via ORAL
  Filled 2022-12-21 (×3): qty 1

## 2022-12-21 MED ORDER — KETAMINE HCL 10 MG/ML IJ SOLN
INTRAMUSCULAR | Status: DC | PRN
  Administered 2022-12-21: 10 mg via INTRAVENOUS

## 2022-12-21 MED ORDER — PHENYLEPHRINE 80 MCG/ML (10ML) SYRINGE FOR IV PUSH (FOR BLOOD PRESSURE SUPPORT)
PREFILLED_SYRINGE | INTRAVENOUS | Status: DC | PRN
  Administered 2022-12-21 (×2): 80 ug via INTRAVENOUS

## 2022-12-21 MED ORDER — KETOROLAC TROMETHAMINE 30 MG/ML IJ SOLN
INTRAMUSCULAR | Status: AC
  Filled 2022-12-21: qty 1

## 2022-12-21 MED ORDER — PHENYLEPHRINE HCL-NACL 20-0.9 MG/250ML-% IV SOLN
INTRAVENOUS | Status: DC | PRN
  Administered 2022-12-21: 40 ug/min via INTRAVENOUS

## 2022-12-21 MED ORDER — ACETAMINOPHEN 325 MG PO TABS
325.0000 mg | ORAL_TABLET | Freq: Four times a day (QID) | ORAL | Status: DC | PRN
  Administered 2022-12-24 – 2022-12-25 (×2): 650 mg via ORAL
  Filled 2022-12-21 (×3): qty 2

## 2022-12-21 MED ORDER — PROPOFOL 10 MG/ML IV BOLUS
INTRAVENOUS | Status: DC | PRN
  Administered 2022-12-21 (×2): 10 mg via INTRAVENOUS
  Administered 2022-12-21: 20 mg via INTRAVENOUS
  Administered 2022-12-21 (×2): 10 mg via INTRAVENOUS

## 2022-12-21 MED ORDER — METHOCARBAMOL 500 MG PO TABS
500.0000 mg | ORAL_TABLET | Freq: Four times a day (QID) | ORAL | Status: DC | PRN
  Administered 2022-12-22 – 2022-12-25 (×5): 500 mg via ORAL
  Filled 2022-12-21 (×5): qty 1

## 2022-12-21 MED ORDER — CEFAZOLIN SODIUM-DEXTROSE 2-4 GM/100ML-% IV SOLN
2.0000 g | INTRAVENOUS | Status: AC
  Administered 2022-12-21: 2 g via INTRAVENOUS

## 2022-12-21 MED ORDER — TRANEXAMIC ACID-NACL 1000-0.7 MG/100ML-% IV SOLN
1000.0000 mg | INTRAVENOUS | Status: AC
  Administered 2022-12-21: 1000 mg via INTRAVENOUS

## 2022-12-21 SURGICAL SUPPLY — 40 items
BAG COUNTER SPONGE SURGICOUNT (BAG) IMPLANT
BAG DECANTER FOR FLEXI CONT (MISCELLANEOUS) IMPLANT
BAG ZIPLOCK 12X15 (MISCELLANEOUS) IMPLANT
BLADE SAG 18X100X1.27 (BLADE) ×1 IMPLANT
COVER PERINEAL POST (MISCELLANEOUS) ×1 IMPLANT
COVER SURGICAL LIGHT HANDLE (MISCELLANEOUS) ×1 IMPLANT
CUP ACET PINNACLE SECTR 50MM (Hips) IMPLANT
DERMABOND ADVANCED .7 DNX12 (GAUZE/BANDAGES/DRESSINGS) ×1 IMPLANT
DRAPE FOOT SWITCH (DRAPES) ×1 IMPLANT
DRAPE STERI IOBAN 125X83 (DRAPES) ×1 IMPLANT
DRAPE U-SHAPE 47X51 STRL (DRAPES) ×2 IMPLANT
DRESSING AQUACEL AG SP 3.5X10 (GAUZE/BANDAGES/DRESSINGS) ×1 IMPLANT
DRSG AQUACEL AG ADV 3.5X10 (GAUZE/BANDAGES/DRESSINGS) IMPLANT
DRSG AQUACEL AG SP 3.5X10 (GAUZE/BANDAGES/DRESSINGS) ×1
DURAPREP 26ML APPLICATOR (WOUND CARE) ×1 IMPLANT
ELECT REM PT RETURN 15FT ADLT (MISCELLANEOUS) ×1 IMPLANT
GLOVE BIO SURGEON STRL SZ 6 (GLOVE) ×1 IMPLANT
GLOVE BIOGEL PI IND STRL 6.5 (GLOVE) ×1 IMPLANT
GLOVE BIOGEL PI IND STRL 7.5 (GLOVE) ×1 IMPLANT
GLOVE ORTHO TXT STRL SZ7.5 (GLOVE) ×2 IMPLANT
GOWN STRL REUS W/ TWL LRG LVL3 (GOWN DISPOSABLE) ×2 IMPLANT
GOWN STRL REUS W/TWL LRG LVL3 (GOWN DISPOSABLE) ×2
HEAD FEM STD 32X+1 STRL (Hips) IMPLANT
HOLDER FOLEY CATH W/STRAP (MISCELLANEOUS) ×1 IMPLANT
KIT TURNOVER KIT A (KITS) IMPLANT
LINER ACET PNNCL PLUS4 NEUTRAL (Hips) IMPLANT
PACK ANTERIOR HIP CUSTOM (KITS) ×1 IMPLANT
PINNACLE PLUS 4 NEUTRAL (Hips) ×1 IMPLANT
PINNACLE SECTOR CUP 50MM (Hips) ×1 IMPLANT
SCREW 6.5MMX30MM (Screw) IMPLANT
STEM FEMORAL SZ6 HIGH ACTIS (Stem) IMPLANT
SUT MNCRL AB 4-0 PS2 18 (SUTURE) ×1 IMPLANT
SUT STRATAFIX 0 PDS 27 VIOLET (SUTURE) ×1
SUT VIC AB 1 CT1 36 (SUTURE) ×3 IMPLANT
SUT VIC AB 2-0 CT1 27 (SUTURE) ×2
SUT VIC AB 2-0 CT1 TAPERPNT 27 (SUTURE) ×2 IMPLANT
SUTURE STRATFX 0 PDS 27 VIOLET (SUTURE) ×1 IMPLANT
TRAY FOLEY MTR SLVR 16FR STAT (SET/KITS/TRAYS/PACK) IMPLANT
TUBE SUCTION HIGH CAP CLEAR NV (SUCTIONS) ×1 IMPLANT
WATER STERILE IRR 1000ML POUR (IV SOLUTION) ×1 IMPLANT

## 2022-12-21 NOTE — Progress Notes (Signed)
Initial Nutrition Assessment RD working remotely.   DOCUMENTATION CODES:   Not applicable  INTERVENTION:  - ordered Ensure Plus High Protein BID, each supplement provides 350 kcal and 20 grams of protein.;  - ordered 1 tablet multivitamin with minerals/day.  - complete NFPE when feasible.   NUTRITION DIAGNOSIS:   Increased nutrient needs related to hip fracture, post-op healing as evidenced by estimated needs.  GOAL:   Patient will meet greater than or equal to 90% of their needs  MONITOR:   Diet advancement, PO intake, Supplement acceptance, Labs, Weight trends  REASON FOR ASSESSMENT:   Consult Hip fracture protocol  ASSESSMENT:   82 y.o. female with medical history of hypothyroidism, HTN, anxiety, depression, HLD, IBS, osteopenia, GERD, vitamin D deficiency, stage 2 CKD, osteoporosis. She presented to the ED due to a fall with subsequent R hip pain. She was unable to get up on her own after the fall.  Patient has been NPO since admission pending OR for fixation of R femur fracture.  She has not been assessed by a [place] RD at any time in the past. MST score of 0. Weight on 1/5 was documented as 125 lb and appears to possibly be a stated weight. Weight is +2 lb compared to weight on 07/08/22. Prior to that, the most recently documented weight was 130 lb on 07/02/21.   Labs reviewed; Na: 134 mmol/l, Cl: 95 mmol/l, BUN: 27 mg/dl, GFR: 60 ml/min. Medications reviewed. IVF; LR @ 100 ml/hr.    NUTRITION - FOCUSED PHYSICAL EXAM:  RD working remotely.  Diet Order:   Diet Order             Diet NPO time specified Except for: Sips with Meds  Diet effective now                   EDUCATION NEEDS:   No education needs have been identified at this time  Skin:  Skin Assessment: Reviewed RN Assessment  Last BM:  PTA/unknown  Height:   Ht Readings from Last 1 Encounters:  12/19/22 '5\' 2"'$  (1.575 m)    Weight:   Wt Readings from Last 1 Encounters:   12/19/22 56.7 kg    BMI:  Body mass index is 22.86 kg/m.  Estimated Nutritional Needs:  Kcal:  1450-1650 kcal Protein:  70-85 grams Fluid:  >/= 1.6 L/day     Jarome Matin, MS, RD, LDN, CNSC Clinical Dietitian PRN/Relief staff On-call/weekend pager # available in [facility]

## 2022-12-21 NOTE — Anesthesia Procedure Notes (Addendum)
Spinal  Patient location during procedure: OR Start time: 12/21/2022 10:13 AM End time: 12/21/2022 10:18 AM Reason for block: surgical anesthesia Staffing Performed: anesthesiologist  Anesthesiologist: Barnet Glasgow, MD Performed by: Barnet Glasgow, MD Authorized by: Barnet Glasgow, MD   Preanesthetic Checklist Completed: patient identified, IV checked, risks and benefits discussed, surgical consent, monitors and equipment checked, pre-op evaluation and timeout performed Spinal Block Patient position: left lateral decubitus Prep: DuraPrep and site prepped and draped Patient monitoring: heart rate, cardiac monitor, continuous pulse ox and blood pressure Approach: midline Location: L3-4 Injection technique: single-shot Needle Needle type: Pencan  Needle gauge: 24 G Needle length: 10 cm Needle insertion depth: 6 cm Assessment Sensory level: T4 Events: CSF return Additional Notes  1 Attempt (s). Pt tolerated procedure well.

## 2022-12-21 NOTE — Op Note (Signed)
NAME:  Brittany Douglas                ACCOUNT NO.: [id number]      MEDICAL RECORD NO.: [id number]      FACILITY:  [facility]      PHYSICIAN:  Mauri Pole  DATE OF BIRTH:  06/23/1941     DATE OF PROCEDURE:  12/21/2022                                 OPERATIVE REPORT         PREOPERATIVE DIAGNOSIS: Right  hip osteoarthritis.      POSTOPERATIVE DIAGNOSIS:  Right hip osteoarthritis.      PROCEDURE:  Right total hip replacement through an anterior approach   utilizing DePuy THR system, component size 50 mm pinnacle cup, a size 32+4 neutral   Altrex liner, a size 6 Hi Actis stem with a 32+1 Articuleze metal head ball.      SURGEON:  Pietro Cassis. Alvan Dame, M.D.      ASSISTANT:  Costella Hatcher, PA-C     ANESTHESIA:  Spinal.      SPECIMENS:  None.      COMPLICATIONS:  None.      BLOOD LOSS:  300 cc     DRAINS:  None.      INDICATION OF THE PROCEDURE:  Brittany Douglas is a 82 y.o. female who presented to the emergency room after a fall in her bathroom.  She had immediate onset of right hip pain with inability to bear weight.  Radiographs revealed a displaced right femoral neck fracture.  We reviewed the indications for the procedure and the plan to proceed with total hip arthroplasty as opposed to hemiarthroplasty or percutaneous screw fixation.  Risks of infection DVT dislocation neurovascular injury were reviewed.  Consent was obtained for management of the fracture and her functional quality life.     PROCEDURE IN DETAIL:  The patient was brought to operative theater.   Once adequate anesthesia, preoperative antibiotics, 2 gm of Ancef, 1 gm of Tranexamic Acid, and 10 mg of Decadron were administered, the patient was positioned supine on the [employer] table.  Once the patient was safely positioned with adequate padding of boney prominences we predraped out the hip, and used fluoroscopy to confirm orientation of the pelvis.      The right hip was then prepped  and draped from proximal iliac crest to   mid thigh with a shower curtain technique.      Time-out was performed identifying the patient, planned procedure, and the appropriate extremity.     An incision was then made 2 cm lateral to the   anterior superior iliac spine extending over the orientation of the   tensor fascia lata muscle and sharp dissection was carried down to the   fascia of the muscle.      The fascia was then incised.  The muscle belly was identified and swept   laterally and retractor placed along the superior neck.  Following   cauterization of the circumflex vessels and removing some pericapsular   fat, a second cobra retractor was placed on the inferior neck.  A T-capsulotomy was made along the line of the   superior neck to the trochanteric fossa, then extended proximally and   distally.  Tag sutures were placed and the retractors were then placed   intracapsular.  We  then identified the trochanteric fossa and   orientation of my neck cut and then made a neck osteotomy with the femur on traction.  The fractured femoral neck segment and then the femoral head were removed without difficulty or complication.  Traction was let   off and retractors were placed posterior and anterior around the   acetabulum.      The labrum and foveal tissue were debrided.  I began reaming with a 45 mm   reamer and reamed up to 49 mm reamer with good bony bed preparation and a 50 mm  cup was chosen.  The final 50 mm Pinnacle cup was then impacted under fluoroscopy to confirm the depth of penetration and orientation with respect to   Abduction and forward flexion.  A screw was placed into the ilium followed by the hole eliminator.  The final   32+4 neutral Altrex liner was impacted with good visualized rim fit.  The cup was positioned anatomically within the acetabular portion of the pelvis.      At this point, the femur was rolled to 100 degrees.  Further capsule was   released off the  inferior aspect of the femoral neck.  I then   released the superior capsule proximally.  With the leg in a neutral position the hook was placed laterally   along the femur under the vastus lateralis origin and elevated manually and then held in position using the hook attachment on the bed.  The leg was then extended and adducted with the leg rolled to 100   degrees of external rotation.  Retractors were placed along the medial calcar and posteriorly over the greater trochanter.  Once the proximal femur was fully   exposed, I used a box osteotome to set orientation.  I then began   broaching with the starting chili pepper broach and passed this by hand and then broached up to 6.  With the 6 broach in place I chose a high offset neck and did several trial reductions.  The offset was appropriate, leg lengths   appeared to be equal best matched with the +1 head ball trial confirmed radiographically.   Given these findings, I went ahead and dislocated the hip, repositioned all   retractors and positioned the right hip in the extended and abducted position.  The final 6 Hi Actis stem was   chosen and it was impacted down to the level of neck cut.  Based on this   and the trial reductions, a final 32+1 Articuleze metal head ball was chosen and   impacted onto a clean and dry trunnion, and the hip was reduced.  The   hip had been irrigated throughout the case again at this point.  I did   reapproximate the superior capsular leaflet to the anterior leaflet   using #1 Vicryl.  The fascia of the   tensor fascia lata muscle was then reapproximated using #1 Vicryl and #0 Stratafix sutures.  The   remaining wound was closed with 2-0 Vicryl and running 4-0 Monocryl.   The hip was cleaned, dried, and dressed sterilely using Dermabond and   Aquacel dressing.  The patient was then brought   to recovery room in stable condition tolerating the procedure well.    Costella Hatcher, PA-C was present for the entirety  of the case involved from   preoperative positioning, perioperative retractor management, general   facilitation of the case, as well as primary wound closure as assistant.  Pietro Cassis Alvan Dame, M.D.        12/21/2022 10:02 AM

## 2022-12-21 NOTE — Discharge Instructions (Signed)

## 2022-12-21 NOTE — Progress Notes (Signed)
PROGRESS NOTE   Brittany Douglas  QPY:[id number] DOB: 1941/07/25 DOA: 12/20/2022 PCP: [name], MD  Brief Narrative:  69 home dwell known HTN HLD IBS anxiety lost balance at home fell with pain in right hip Found to have acute proximal R femoral #  Orthopedics Dr. Alvan Dame consulted, right total hip replacement performed 1/7   Hospital-Problem based course  Right hip fracture status post repair - Follow orthopedic protocol, Tylenol + Norco for moderate pain IV morphine for severe pain, adjunctive Robaxin as needed - WBAT as per Ortho, DVT prophylaxis ASA 81 twice daily  Osteopenia/osteoporosis - Holding alendronate, is supposed to get Reclast injections this will be held for now  Hypothyroidism - Resume Synthroid 25, continue specialized dosing  HTN - Continue losartan 100 daily, amlodipine 5 daily - Monitor trends  Anxiety - Continue Zoloft 100, diazepam 2 mg every 6 as needed   DVT prophylaxis: Lovenox at this time Code Status: DNR noted Family Communication: Discussed with daughter at bedside Disposition:  Status is: Inpatient Remains inpatient appropriate because:   Requires skilled facility placement  Subjective: Awake coherent pleasant no distress Just back from surgery is still on oxygen Daughter is at the bedside Patient endorses no severe pain nausea   Objective: Vitals:   12/20/22 1300 12/20/22 1400 12/20/22 2035 12/21/22 0516  BP: (!) 145/54 (!) 137/48 (!) 122/57 (!) 141/66  Pulse: 78 76 65 65  Resp: '14 14 16 18  '$ Temp:  98.4 F (36.9 C) 98.2 F (36.8 C) 98.4 F (36.9 C)  TempSrc:  Oral Oral Oral  SpO2: 93% 96% 95% 97%  Weight:      Height:        Intake/Output Summary (Last 24 hours) at 12/21/2022 0734 Last data filed at 12/21/2022 0600 Gross per 24 hour  Intake 1090 ml  Output 300 ml  Net 790 ml   Filed Weights   12/19/22 2321  Weight: 56.7 kg    Examination:  EOMI NCAT quite slim white female no distress Chest is clear no  added sound no rales no rhonchi no wheeze S1-S2 no murmur sinus arrhythmia Abdomen soft no rebound No lower extremity edema Affect is pleasant  Data Reviewed: personally reviewed   CBC    Component Value Date/Time   WBC 13.6 (H) 12/20/2022 0045   RBC 3.79 (L) 12/20/2022 0045   HGB 12.7 12/20/2022 0045   HCT 36.7 12/20/2022 0045   PLT 176 12/20/2022 0045   MCV 96.8 12/20/2022 0045   MCH 33.5 12/20/2022 0045   MCHC 34.6 12/20/2022 0045   RDW 12.8 12/20/2022 0045   LYMPHSABS 0.7 12/20/2022 0045   MONOABS 0.8 12/20/2022 0045   EOSABS 0.0 12/20/2022 0045   BASOSABS 0.0 12/20/2022 0045      Latest Ref Rng & Units 12/20/2022   12:45 AM 07/26/2019    9:04 PM 06/27/2008    3:23 AM  CMP  Glucose 70 - 99 mg/dL 122  114  123   BUN 8 - 23 mg/dL '27  18  5   '$ Creatinine 0.44 - 1.00 mg/dL 0.95  0.63  0.73   Sodium 135 - 145 mmol/L 134  135  135   Potassium 3.5 - 5.1 mmol/L 4.8  4.2  3.3   Chloride 98 - 111 mmol/L 95  97  104   CO2 22 - 32 mmol/L '26  27  28   '$ Calcium 8.9 - 10.3 mg/dL 9.8  9.3  8.1   Total Protein    4.9  Total Bilirubin    1.2   Alkaline Phos    39   AST    18   ALT    12      Radiology Studies: DG Chest Port 1 View  Result Date: 12/20/2022 CLINICAL DATA:  Fall.  Preoperative exam. EXAM: PORTABLE CHEST 1 VIEW COMPARISON:  07/26/2019. FINDINGS: The heart size and mediastinal contours are within normal limits. There is atherosclerotic calcification of the aorta. No consolidation, effusion, or pneumothorax. No acute osseous abnormality. IMPRESSION: No active disease. Electronically Signed   By: Brett Fairy M.D.   On: 12/20/2022 01:41   DG Hip Unilat  With Pelvis 2-3 Views Right  Result Date: 12/20/2022 CLINICAL DATA:  Status post fall. EXAM: DG HIP (WITH OR WITHOUT PELVIS) 2-3V RIGHT COMPARISON:  None Available. FINDINGS: An acute fracture deformity is seen extending through the neck of the proximal right femur. There is no evidence of dislocation. Moderate severity  degenerative changes are seen in the form of joint space narrowing and acetabular sclerosis. IMPRESSION: Acute fracture of the proximal right femur. Electronically Signed   By: Virgina Norfolk M.D.   On: 12/20/2022 00:10   DG Wrist Complete Right  Result Date: 12/20/2022 CLINICAL DATA:  Status post fall. EXAM: RIGHT WRIST - COMPLETE 3+ VIEW COMPARISON:  None Available. FINDINGS: There is no evidence of an acute fracture or dislocation. Marked severity degenerative changes are seen involving the first right carpometacarpal articulation. Soft tissues are unremarkable. IMPRESSION: Degenerative changes without evidence of an acute osseous abnormality. Electronically Signed   By: Virgina Norfolk M.D.   On: 12/20/2022 00:07   DG Elbow Complete Right  Result Date: 12/20/2022 CLINICAL DATA:  Status post fall. EXAM: RIGHT ELBOW - COMPLETE 3+ VIEW COMPARISON:  None Available. FINDINGS: There is no evidence of fracture, dislocation, or joint effusion. There is no evidence of arthropathy or other focal bone abnormality. Soft tissues are unremarkable. IMPRESSION: Negative. Electronically Signed   By: Virgina Norfolk M.D.   On: 12/20/2022 00:06     Scheduled Meds:  enoxaparin (LOVENOX) injection  40 mg Subcutaneous Q24H   Continuous Infusions:  lactated ringers 100 mL/hr at 12/21/22 0021     LOS: 1 day   Time spent:33  Nita Sells, MD [facility] To contact the attending provider between 7A-7P or the covering provider during after hours 7P-7A, please log into the web site [email or web address] and access using universal [place] password for that web site. If you do not have the password, please call the hospital operator.  12/21/2022, 7:34 AM

## 2022-12-21 NOTE — H&P (View-Only) (Signed)
Patient ID: Brittany Douglas, female   DOB: 09-Mar-1941, 82 y.o.   MRN: [id number] Subjective: Day of Surgery    Daughter at bedside  Patient reports pain as significant with any movement. No other injuries noted  Objective:   VITALS:   Vitals:   12/20/22 2035 12/21/22 0516  BP: (!) 122/57 (!) 141/66  Pulse: 65 65  Resp: 16 18  Temp: 98.2 F (36.8 C) 98.4 F (36.9 C)  SpO2: 95% 97%    Neurovascular intact Deferred any ROM assessment  LABS Recent Labs    12/20/22 0045  HGB 12.7  HCT 36.7  WBC 13.6*  PLT 176    Recent Labs    12/20/22 0045  NA 134*  K 4.8  BUN 27*  CREATININE 0.95  GLUCOSE 122*    Recent Labs    12/20/22 0045  INR 1.0   Narrative & Impression  CLINICAL DATA:  Status post fall.   EXAM: DG HIP (WITH OR WITHOUT PELVIS) 2-3V RIGHT   COMPARISON:  None Available.   FINDINGS: An acute fracture deformity is seen extending through the neck of the proximal right femur. There is no evidence of dislocation. Moderate severity degenerative changes are seen in the form of joint space narrowing and acetabular sclerosis.   IMPRESSION: Acute fracture of the proximal right femur.     Electronically Signed   By: Virgina Norfolk M.D.    Assessment/Plan: Day of Surgery   Displaced right hip femoral neck fracture  Plan: To OR today for right THR Consent form filled out to be signed Post op course and expectations reviewed

## 2022-12-21 NOTE — Anesthesia Postprocedure Evaluation (Signed)
Anesthesia Post Note  Patient: Brittany Douglas  Procedure(s) Performed: TOTAL HIP ARTHROPLASTY ANTERIOR APPROACH (Right: Hip)     Patient location during evaluation: Nursing Unit Anesthesia Type: Spinal Level of consciousness: oriented and awake and alert Pain management: pain level controlled Vital Signs Assessment: post-procedure vital signs reviewed and stable Respiratory status: spontaneous breathing and respiratory function stable Cardiovascular status: blood pressure returned to baseline and stable Postop Assessment: no headache, no backache, no apparent nausea or vomiting and patient able to bend at knees Anesthetic complications: no  No notable events documented.  Last Vitals:  Vitals:   12/21/22 1316 12/21/22 1452  BP: (!) 155/63 (!) 133/49  Pulse: 75 80  Resp: 16 18  Temp: 36.4 C 36.9 C  SpO2: 94% 97%    Last Pain:  Vitals:   12/21/22 1452  TempSrc: Oral  PainSc:                  Barnet Glasgow

## 2022-12-21 NOTE — Transfer of Care (Signed)
Immediate Anesthesia Transfer of Care Note  Patient: Brittany Douglas  Procedure(s) Performed: TOTAL HIP ARTHROPLASTY ANTERIOR APPROACH (Right: Hip)  Patient Location: PACU  Anesthesia Type:Spinal  Level of Consciousness: drowsy and patient cooperative  Airway & Oxygen Therapy: Patient Spontanous Breathing and Patient connected to face mask oxygen  Post-op Assessment: Report given to RN and Post -op Vital signs reviewed and stable  Post vital signs: Reviewed and stable  Last Vitals:  Vitals Value Taken Time  BP    Temp    Pulse    Resp    SpO2      Last Pain:  Vitals:   12/21/22 0753  TempSrc:   PainSc: 2       Patients Stated Pain Goal: 2 (74/14/23 9532)  Complications: No notable events documented.

## 2022-12-21 NOTE — Interval H&P Note (Signed)
History and Physical Interval Note:  12/21/2022 10:02 AM  Brittany Douglas  has presented today for surgery, with the diagnosis of RIGHT HIP FRACTURE.  The various methods of treatment have been discussed with the patient and family. After consideration of risks, benefits and other options for treatment, the patient has consented to  Procedure(s): TOTAL HIP ARTHROPLASTY ANTERIOR APPROACH (Right) as a surgical intervention.  The patient's history has been reviewed, patient examined, no change in status, stable for surgery.  I have reviewed the patient's chart and labs.  Questions were answered to the patient's satisfaction.     Mauri Pole

## 2022-12-21 NOTE — Plan of Care (Signed)
  Problem: Education: Goal: Knowledge of General Education information will improve Description: Including pain rating scale, medication(s)/side effects and non-pharmacologic comfort measures Outcome: Progressing   Problem: Activity: Goal: Risk for activity intolerance will decrease Outcome: Progressing   Problem: Pain Managment: Goal: General experience of comfort will improve Outcome: Progressing   

## 2022-12-21 NOTE — Progress Notes (Signed)
Patient ID: Brittany Douglas, female   DOB: [birth year], 82 y.o.   MRN: [id number] Subjective: Day of Surgery    Daughter at bedside  Patient reports pain as significant with any movement. No other injuries noted  Objective:   VITALS:   Vitals:   12/20/22 2035 12/21/22 0516  BP: (!) 122/57 (!) 141/66  Pulse: 65 65  Resp: 16 18  Temp: 98.2 F (36.8 C) 98.4 F (36.9 C)  SpO2: 95% 97%    Neurovascular intact Deferred any ROM assessment  LABS Recent Labs    12/20/22 0045  HGB 12.7  HCT 36.7  WBC 13.6*  PLT 176    Recent Labs    12/20/22 0045  NA 134*  K 4.8  BUN 27*  CREATININE 0.95  GLUCOSE 122*    Recent Labs    12/20/22 0045  INR 1.0   Narrative & Impression  CLINICAL DATA:  Status post fall.   EXAM: DG HIP (WITH OR WITHOUT PELVIS) 2-3V RIGHT   COMPARISON:  None Available.   FINDINGS: An acute fracture deformity is seen extending through the neck of the proximal right femur. There is no evidence of dislocation. Moderate severity degenerative changes are seen in the form of joint space narrowing and acetabular sclerosis.   IMPRESSION: Acute fracture of the proximal right femur.     Electronically Signed   By: Virgina Norfolk M.D.    Assessment/Plan: Day of Surgery   Displaced right hip femoral neck fracture  Plan: To OR today for right THR Consent form filled out to be signed Post op course and expectations reviewed

## 2022-12-22 ENCOUNTER — Inpatient Hospital Stay (HOSPITAL_COMMUNITY): Payer: PPO

## 2022-12-22 DIAGNOSIS — S72001A Fracture of unspecified part of neck of right femur, initial encounter for closed fracture: Secondary | ICD-10-CM | POA: Diagnosis not present

## 2022-12-22 LAB — BASIC METABOLIC PANEL
Anion gap: 5 (ref 5–15)
BUN: 9 mg/dL (ref 8–23)
CO2: 30 mmol/L (ref 22–32)
Calcium: 7.8 mg/dL — ABNORMAL LOW (ref 8.9–10.3)
Chloride: 99 mmol/L (ref 98–111)
Creatinine, Ser: 0.65 mg/dL (ref 0.44–1.00)
GFR, Estimated: >60 mL/min (ref >60)
Glucose, Bld: 147 mg/dL — ABNORMAL HIGH (ref 70–99)
Potassium: 3.5 mmol/L (ref 3.5–5.1)
Sodium: 134 mmol/L — ABNORMAL LOW (ref 135–145)

## 2022-12-22 LAB — CBC
HCT: 29.2 % — ABNORMAL LOW (ref 36.0–46.0)
Hemoglobin: 9.9 g/dL — ABNORMAL LOW (ref 12.0–15.0)
MCH: 33.7 pg (ref 26.0–34.0)
MCHC: 33.9 g/dL (ref 30.0–36.0)
MCV: 99.3 fL (ref 80.0–100.0)
Platelets: 118 10*3/uL — ABNORMAL LOW (ref 150–400)
RBC: 2.94 MIL/uL — ABNORMAL LOW (ref 3.87–5.11)
RDW: 12.5 % (ref 11.5–15.5)
WBC: 9 10*3/uL (ref 4.0–10.5)
nRBC: 0 % (ref 0.0–0.2)

## 2022-12-22 MED ORDER — FUROSEMIDE 10 MG/ML IJ SOLN
40.0000 mg | Freq: Once | INTRAMUSCULAR | Status: AC
  Administered 2022-12-22: 40 mg via INTRAVENOUS
  Filled 2022-12-22: qty 4

## 2022-12-22 NOTE — TOC Initial Note (Signed)
Transition of Care [facility]) - Initial/Assessment Note    Patient Details  Name: Brittany Douglas MRN: [id number] Date of Birth: [birth year]  Transition of Care [facility]) CM/SW Contact:    Lennart Pall, LCSW Phone Number: 12/22/2022, 3:57 PM  Clinical Narrative:                 Have spoken with pt and daughter and both are in agreement with therapy recommendations for SNF rehab. They note that pt's spouse is in the home, however, cannot meet her current assistance needs.  We reviewed facility preferences.  Bed search and insurance authorization begun.  Expected Discharge Plan: Skilled Nursing Facility Barriers to Discharge: Insurance Authorization, SNF Pending bed offer, Continued Medical Work up   Patient Goals and CMS Choice Patient states their goals for this hospitalization and ongoing recovery are:: return home following SNF rehab          Expected Discharge Plan and Services In-house Referral: Clinical Social Work   Post Acute Care Choice: [place] Living arrangements for the past 2 months: [place]                 DME Arranged: N/A DME Agency: NA                  Prior Living Arrangements/Services Living arrangements for the past 2 months: Single Family Home Lives with:: Spouse Patient language and need for interpreter reviewed:: Yes Do you feel safe going back to the place where you live?: Yes      Need for Family Participation in Patient Care: Yes (Comment) Care giver support system in place?: Yes (comment)   Criminal Activity/Legal Involvement Pertinent to Current Situation/Hospitalization: No - Comment as needed  Activities of Daily Living Home Assistive Devices/Equipment: Walker (specify type) ADL Screening (condition at time of admission) Patient's cognitive ability adequate to safely complete daily activities?: Yes Is the patient deaf or have difficulty hearing?: Yes Does the patient have difficulty seeing, even when wearing  glasses/contacts?: No Does the patient have difficulty concentrating, remembering, or making decisions?: No Patient able to express need for assistance with ADLs?: Yes Does the patient have difficulty dressing or bathing?: No Independently performs ADLs?: Yes (appropriate for developmental age) Does the patient have difficulty walking or climbing stairs?: Yes Weakness of Legs: Both Weakness of Arms/Hands: None  Permission Sought/Granted Permission sought to share information with : Family Supports Permission granted to share information with : Yes, Verbal Permission Granted  Share Information with NAME: [name]     Permission granted to share info w Relationship: daughter  Permission granted to share info w Contact Information: [phone]  Emotional Assessment Appearance:: Appears stated age Attitude/Demeanor/Rapport: Engaged Affect (typically observed): Accepting Orientation: : Oriented to Self, Oriented to Place, Oriented to  Time, Oriented to Situation Alcohol / Substance Use: Not Applicable Psych Involvement: No (comment)  Admission diagnosis:  Hyponatremia [E87.1] Fracture of femoral neck, right, closed (HCC) [S72.001A] Closed fracture of neck of right femur, initial encounter ([place]) [S72.001A] Patient Active Problem List   Diagnosis Date Noted   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 [place] [address] Phone: [phone] Fax: [phone]     Social Determinants of Health (SDOH) Social History: [place]: Low Risk  (12/20/2022)   Tobacco Use: Low Risk  (12/19/2022)   SDOH Interventions: Housing Interventions: Patient Refused   Readmission Risk Interventions    12/22/2022    3:33 PM  Readmission Risk Prevention Plan  Post Dischage Appt Complete  Medication Screening Complete  Transportation Screening Complete

## 2022-12-22 NOTE — Progress Notes (Signed)
Patient ID: Brittany Douglas, female   DOB: 1941/04/30, 82 y.o.   MRN: [id number] Subjective: 1 Day Post-Op Procedure(s) (LRB): TOTAL HIP ARTHROPLASTY ANTERIOR APPROACH (Right)    Patient reports pain as moderate.  She can appreciate an improvement in her pain from yesterday and the acute fracture pain. No events noted  Objective:   VITALS:   Vitals:   12/22/22 0133 12/22/22 0501  BP: 139/68 (!) 156/62  Pulse: 86 73  Resp: 17 17  Temp: 98.6 F (37 C) 98.5 F (36.9 C)  SpO2: 97% 97%    Neurovascular intact Incision: dressing C/D/I  LABS Recent Labs    12/20/22 0045 12/22/22 0322  HGB 12.7 9.9*  HCT 36.7 29.2*  WBC 13.6* 9.0  PLT 176 118*    Recent Labs    12/20/22 0045 12/22/22 0322  NA 134* 134*  K 4.8 3.5  BUN 27* 9  CREATININE 0.95 0.65  GLUCOSE 122* 147*    Recent Labs    12/20/22 0045  INR 1.0     Assessment/Plan: 1 Day Post-Op Procedure(s) (LRB): TOTAL HIP ARTHROPLASTY ANTERIOR APPROACH (Right)   Advance diet Up with therapy - WBAT RLE Aspirin for DVT prophylaxis in addition to OOB activity Norco for pain RTC in 2 weeks

## 2022-12-22 NOTE — Progress Notes (Signed)
PROGRESS NOTE   Brittany Douglas  WYO:[id number] DOB: 10-07-1941 DOA: 12/20/2022 PCP: [name], MD  Brief Narrative:  35 home dwell known HTN HLD IBS anxiety lost balance at home fell with pain in right hip Found to have acute proximal R femoral #  Orthopedics Dr. Alvan Dame consulted, right total hip replacement performed 1/7   Hospital-Problem based course  Right hip fracture status post repair - Follow orthopedic protocol, Tylenol + Norco for moderate pain IV morphine for severe pain, adjunctive Robaxin as needed - WBAT as per Ortho, DVT prophylaxis ASA 81 twice daily -Therapy has evaluated patient will need skilled placement  Hypoxia -Probably secondary to perioperative fluids given, stop IV fluid, give Lasix 40 X1, desat screen in a.m. - Hopeful to avoid need for oxygen but will reevaluate  Osteopenia/osteoporosis - Holding alendronate, is supposed to get Reclast injections this will be held for now  Hypothyroidism - Resume Synthroid 25, continue specialized dosing  HTN - Continue losartan 100 daily, amlodipine 5 daily - Monitor trends  Anxiety - Continue Zoloft 100, diazepam 2 mg every 6 as needed   DVT prophylaxis: Lovenox at this time Code Status: DNR noted Family Communication: Discussed with daughter at bedside Disposition:  Status is: Inpatient Remains inpatient appropriate because:   Requires skilled facility placement  Subjective:  Pleasant coherent when seen at the bedside looks comfortable Pain is moderate-needed 2+ assist to get from bed to chair No nausea no vomiting Has not had a stool Nursing informed me dropped to low 80s off oxygen   Objective: Vitals:   12/22/22 0133 12/22/22 0501 12/22/22 1012 12/22/22 1351  BP: 139/68 (!) 156/62 (!) 111/48 (!) 118/55  Pulse: 86 73 77 85  Resp: '17 17 17 18  '$ Temp: 98.6 F (37 C) 98.5 F (36.9 C) 97.7 F (36.5 C) (!) 97.5 F (36.4 C)  TempSrc: Oral Oral Oral Oral  SpO2: 97% 97% 98% 94%   Weight:      Height:        Intake/Output Summary (Last 24 hours) at 12/22/2022 1536 Last data filed at 12/22/2022 1400 Gross per 24 hour  Intake 906.25 ml  Output 2075 ml  Net -1168.75 ml    Filed Weights   12/19/22 2321  Weight: 56.7 kg    Examination:  Pleasant coherent no distress EOMI NCAT No rales rhonchi or wheeze Abdomen is soft No lower extremity edema, ROM limited secondary to fracture Psych euthymic  Data Reviewed: personally reviewed   CBC    Component Value Date/Time   WBC 9.0 12/22/2022 0322   RBC 2.94 (L) 12/22/2022 0322   HGB 9.9 (L) 12/22/2022 0322   HCT 29.2 (L) 12/22/2022 0322   PLT 118 (L) 12/22/2022 0322   MCV 99.3 12/22/2022 0322   MCH 33.7 12/22/2022 0322   MCHC 33.9 12/22/2022 0322   RDW 12.5 12/22/2022 0322   LYMPHSABS 0.7 12/20/2022 0045   MONOABS 0.8 12/20/2022 0045   EOSABS 0.0 12/20/2022 0045   BASOSABS 0.0 12/20/2022 0045      Latest Ref Rng & Units 12/22/2022    3:22 AM 12/20/2022   12:45 AM 07/26/2019    9:04 PM  CMP  Glucose 70 - 99 mg/dL 147  122  114   BUN 8 - 23 mg/dL '9  27  18   '$ Creatinine 0.44 - 1.00 mg/dL 0.65  0.95  0.63   Sodium 135 - 145 mmol/L 134  134  135   Potassium 3.5 - 5.1 mmol/L 3.5  4.8  4.2   Chloride 98 - 111 mmol/L 99  95  97   CO2 22 - 32 mmol/L '30  26  27   '$ Calcium 8.9 - 10.3 mg/dL 7.8  9.8  9.3      Radiology Studies: DG HIP UNILAT WITH PELVIS 2-3 VIEWS RIGHT  Result Date: 12/22/2022 CLINICAL DATA:  Status post total right hip arthroplasty. EXAM: DG HIP (WITH OR WITHOUT PELVIS) 2-3V RIGHT COMPARISON:  AP pelvis 12/21/2022 FINDINGS: Redemonstration of recent total right hip arthroplasty. No perihardware lucency is seen to indicate hardware failure or loosening. Expected postoperative subcutaneous air about the proximal right thigh is slightly decreased from prior. Mild left femoroacetabular joint space narrowing and superolateral left acetabular degenerative osteophytes. The bilateral sacroiliac joint  spaces are maintained. Mild pubic symphysis joint space narrowing and subchondral sclerosis. IMPRESSION: Status post total right hip arthroplasty without evidence of hardware failure. Electronically Signed   By: Yvonne Kendall M.D.   On: 12/22/2022 08:27   DG Pelvis Portable  Result Date: 12/21/2022 CLINICAL DATA:  Status post hip arthroplasty. EXAM: PORTABLE PELVIS 1-2 VIEWS COMPARISON:  Same day hip radiographs FINDINGS: The patient is status post a total hip arthroplasty on the right. The hardware appears intact and well aligned. Soft tissue gas overlies the operative site. Degenerative changes are seen in the spine and left hip. IMPRESSION: Postoperative appearance of total hip arthroplasty on the right. Electronically Signed   By: Zerita Boers M.D.   On: 12/21/2022 17:48   DG HIP UNILAT WITH PELVIS 1V RIGHT  Result Date: 12/21/2022 CLINICAL DATA:  Right hip replacement. EXAM: DG HIP (WITH OR WITHOUT PELVIS) 1V RIGHT COMPARISON:  12/20/2022 FINDINGS: 2 intraoperative spot fluoro images obtained in AP projection of the right hip. Status post arthroplasty. No evidence for immediate hardware complication. IMPRESSION: Status post right hip arthroplasty. No evidence for immediate hardware complication. Electronically Signed   By: Misty Stanley M.D.   On: 12/21/2022 11:36   DG C-Arm 1-60 Min-No Report  Result Date: 12/21/2022 Fluoroscopy was utilized by the requesting physician.  No radiographic interpretation.     Scheduled Meds:  aspirin  81 mg Oral BID   docusate sodium  100 mg Oral BID   feeding supplement  237 mL Oral BID BM   levothyroxine  25 mcg Oral QAC breakfast   losartan  100 mg Oral Daily   multivitamin with minerals  1 tablet Oral Daily   polyethylene glycol  17 g Oral BID   sertraline  100 mg Oral Daily   Continuous Infusions:  lactated ringers 100 mL/hr at 12/21/22 1007   methocarbamol (ROBAXIN) IV       LOS: 2 days   Time spent:33  Nita Sells, MD [facility] To contact the attending provider between 7A-7P or the covering provider during after hours 7P-7A, please log into the web site [email or web address] and access using universal [place] password for that web site. If you do not have the password, please call the hospital operator.  12/22/2022, 3:36 PM

## 2022-12-22 NOTE — Evaluation (Signed)
Physical Therapy Evaluation Patient Details Name: Brittany Douglas MRN: [id number] DOB: [birth year] Today's Date: 12/22/2022  History of Present Illness  Pt is an 82 year old female admitted after fall at home and sustained displaced right hip femoral neck fracture.  Pt s/p R THA on 12/21/22.  PHMx: osteopenia, osteoporosis, DDD, DJD, depression, balance problems.  Clinical Impression  Pt admitted with above diagnosis.  Pt currently with functional limitations due to the deficits listed below (see PT Problem List). Pt will benefit from skilled PT to increase their independence and safety with mobility to allow discharge to the venue listed below.   Pt assisted with mobilizing and only able to tolerate ambulating short distance in room.  Pt overall min assist at this time.  Pt prefers to d/c home however uncertain pt will have assist required for safety so will recommend SNF at this time.  SPO2 also dropped to 83% on room air with mobilizing so reapplied 2L O2 [place] and improved to 93%.  Pt left in recliner and encouraged to use incentive spirometer and perform ankle pumps.        Recommendations for follow up therapy are one component of a multi-disciplinary discharge planning process, led by the attending physician.  Recommendations may be updated based on patient status, additional functional criteria and insurance authorization.  Follow Up Recommendations Skilled nursing-short term rehab (<3 hours/day) Can patient physically be transported by private vehicle: No    Assistance Recommended at Discharge Frequent or constant Supervision/Assistance  Patient can return home with the following  A little help with walking and/or transfers;A little help with bathing/dressing/bathroom;Assistance with cooking/housework;Help with stairs or ramp for entrance;Assist for transportation    Equipment Recommendations Rolling walker (2 wheels)  Recommendations for Other Services       Functional Status  Assessment Patient has had a recent decline in their functional status and demonstrates the ability to make significant improvements in function in a reasonable and predictable amount of time.     Precautions / Restrictions Precautions Precautions: Fall Restrictions Weight Bearing Restrictions: No Other Position/Activity Restrictions: WBAT      Mobility  Bed Mobility Overal bed mobility: Needs Assistance Bed Mobility: Supine to Sit     Supine to sit: Min assist     General bed mobility comments: assist for Rt LE, increased time and effort    Transfers Overall transfer level: Needs assistance Equipment used: Rolling walker (2 wheels) Transfers: Sit to/from Stand Sit to Stand: Min assist, From elevated surface           General transfer comment: verbal cues for UE and LE positioning, assist to rise and control descent    Ambulation/Gait Ambulation/Gait assistance: Min assist Gait Distance (Feet): 16 Feet Assistive device: Rolling walker (2 wheels) Gait Pattern/deviations: Step-to pattern, Decreased stance time - right, Antalgic       General Gait Details: verbal cues for sequence, RW positioning, step length; pt limited by pain and fatigue  Stairs            Wheelchair Mobility    Modified Rankin (Stroke Patients Only)       Balance Overall balance assessment: History of Falls                                           Pertinent Vitals/Pain Pain Assessment Pain Assessment: 0-10 Pain Score: 3  Pain Location: right hip  Pain Descriptors / Indicators: Sore, Guarding, Aching Pain Intervention(s): Repositioned, Monitored during session, Premedicated before session    Home Living Family/patient expects to be discharged to:: Private residence Living Arrangements: Spouse/significant other Available Help at Discharge: Family Type of Home: House Home Access: Stairs to enter   [employer] of Steps: has some steps but also  portable ramp   Home Layout: One level Home Equipment: Rollator (4 wheels)      Prior Function Prior Level of Function : Independent/Modified Independent                     Hand Dominance        Extremity/Trunk Assessment        Lower Extremity Assessment Lower Extremity Assessment: Generalized weakness;RLE deficits/detail RLE Deficits / Details: requiring assist due to pain, able to perform ankle pumps       Communication   Communication: No difficulties  Cognition Arousal/Alertness: Awake/alert Behavior During Therapy: WFL for tasks assessed/performed Overall Cognitive Status: Within Functional Limits for tasks assessed                                          General Comments      Exercises     Assessment/Plan    PT Assessment Patient needs continued PT services  PT Problem List Decreased strength;Pain;Decreased mobility;Decreased knowledge of precautions;Decreased balance;Decreased activity tolerance;Decreased knowledge of use of DME;Decreased range of motion;Cardiopulmonary status limiting activity       PT Treatment Interventions Gait training;DME instruction;Therapeutic exercise;Functional mobility training;Therapeutic activities;Patient/family education;Balance training    PT Goals (Current goals can be found in the Care Plan section)  Acute Rehab PT Goals PT Goal Formulation: With patient Time For Goal Achievement: 01/05/23 Potential to Achieve Goals: Good    Frequency Min 3X/week     Co-evaluation               AM-PAC PT "6 Clicks" Mobility  Outcome Measure Help needed turning from your back to your side while in a flat bed without using bedrails?: A Little Help needed moving from lying on your back to sitting on the side of a flat bed without using bedrails?: A Lot Help needed moving to and from a bed to a chair (including a wheelchair)?: A Lot Help needed standing up from a chair using your arms (e.g.,  wheelchair or bedside chair)?: A Lot Help needed to walk in hospital room?: A Lot Help needed climbing 3-5 steps with a railing? : Total 6 Click Score: 12    End of Session Equipment Utilized During Treatment: Gait belt Activity Tolerance: Patient tolerated treatment well Patient left: in chair;with call bell/phone within reach;with chair alarm set;with family/visitor present Nurse Communication: Mobility status PT Visit Diagnosis: Difficulty in walking, not elsewhere classified (R26.2);Pain Pain - Right/Left: Right Pain - part of body: Hip    Time: 7588-3254 PT Time Calculation (min) (ACUTE ONLY): 22 min   Charges:   PT Evaluation $PT Eval Low Complexity: 1 Low        Kati PT, DPT Physical Therapist Acute Rehabilitation Services Preferred contact method: Secure Chat Weekend Pager Only: [phone] Office: [place] 12/22/2022, 11:35 AM

## 2022-12-22 NOTE — NC FL2 (Signed)
[place] LEVEL OF CARE FORM     IDENTIFICATION  Patient Name: Brittany Douglas Birthdate: 1941-05-29 Sex: female Admission Date (Current Location): 12/20/2022  [facility] and [place] Number:  Herbalist and Address:  [facility],  [place], [place]      Provider Number: [id number]  Attending Physician Name and Address:  Nita Sells, MD  Relative Name and Phone Number:  daughter, [name] @ [phone]    Current Level of Care: Hospital Recommended Level of Care: [place] Prior Approval Number:    Date Approved/Denied:   PASRR Number: [id number]  Discharge Plan: SNF    Current Diagnoses: Patient Active Problem List   Diagnosis Date Noted   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   Fracture of femoral neck, right, closed ([place]) 12/20/2022   Depression 12/20/2022   Hypothyroidism 12/20/2022   Fall at home, initial encounter 12/20/2022   Elevated cholesterol    Hypertension    Anxiety    IBS (irritable bowel syndrome)    Reflux     Orientation RESPIRATION BLADDER Height & Weight     Self, Situation, Place, Time  O2 Continent, External catheter (currently with purewick) Weight: 125 lb (56.7 kg) Height:  '5\' 2"'$  (157.5 cm)  BEHAVIORAL SYMPTOMS/MOOD NEUROLOGICAL BOWEL NUTRITION STATUS      Continent Diet (regular)  AMBULATORY STATUS COMMUNICATION OF NEEDS Skin   Limited Assist Verbally Other (Comment) (surgical incision only)                       Personal Care Assistance Level of Assistance  Bathing, Dressing Bathing Assistance: Limited assistance   Dressing Assistance: Limited assistance     Functional Limitations Info             Jordan  OT (By licensed OT), PT (By licensed PT)     PT Frequency: 5x/wk OT Frequency: 5x/wk            Contractures Contractures Info: Not present    Additional Factors Info  Code Status, Allergies  Code Status Info: DNR Allergies Info: Nitrofurantoin, Ace Inhibitors, Amlodipine, Codeine, Cymbalta (Duloxetine Hcl), Doxycycline, Gabapentin, Hctz (Hydrochlorothiazide), Lisinopril, Penicillins, Sulfa Antibiotics           Current Medications (12/22/2022):  This is the current hospital active medication list Current Facility-Administered Medications  Medication Dose Route Frequency Provider Last Rate Last Admin   0.9 %  sodium chloride infusion   Intravenous Continuous Irving Copas, PA-C 75 mL/hr at 12/22/22 0325 New Bag at 12/22/22 0325   acetaminophen (TYLENOL) tablet 325-650 mg  325-650 mg Oral Q6H PRN Irving Copas, PA-C       aspirin chewable tablet 81 mg  81 mg Oral BID Irving Copas, PA-C   81 mg at 12/22/22 1015   bisacodyl (DULCOLAX) suppository 10 mg  10 mg Rectal Daily PRN Irving Copas, PA-C       diazepam (VALIUM) tablet 2 mg  2 mg Oral Q6H PRN Nita Sells, MD   2 mg at 12/21/22 2305   diphenhydrAMINE (BENADRYL) 12.5 MG/5ML elixir 12.5-25 mg  12.5-25 mg Oral Q4H PRN Irving Copas, PA-C       docusate sodium (COLACE) capsule 100 mg  100 mg Oral BID Irving Copas, PA-C   100 mg at 12/22/22 1015   feeding supplement (ENSURE ENLIVE / ENSURE PLUS) liquid 237 mL  237 mL Oral BID BM Samtani,  Jai-Gurmukh, MD   237 mL at 12/22/22 1053   HYDROcodone-acetaminophen (NORCO/VICODIN) 5-325 MG per tablet 1-2 tablet  1-2 tablet Oral Q4H PRN Irving Copas, PA-C   2 tablet at 12/22/22 1014   lactated ringers infusion   Intravenous Continuous Kyle, Tyrone A, DO 100 mL/hr at 12/21/22 1007 Restarted at 12/21/22 1137   levothyroxine (SYNTHROID) tablet 25 mcg  25 mcg Oral QAC breakfast Nita Sells, MD   25 mcg at 12/22/22 0513   losartan (COZAAR) tablet 100 mg  100 mg Oral Daily Nita Sells, MD   100 mg at 12/22/22 1015   menthol-cetylpyridinium (CEPACOL) lozenge 3 mg  1 lozenge Oral PRN Irving Copas, PA-C       Or   phenol (CHLORASEPTIC) mouth  spray 1 spray  1 spray Mouth/Throat PRN Irving Copas, PA-C   1 spray at 12/21/22 2301   methocarbamol (ROBAXIN) tablet 500 mg  500 mg Oral Q6H PRN Irving Copas, PA-C   500 mg at 12/22/22 8676   Or   methocarbamol (ROBAXIN) 500 mg in dextrose 5 % 50 mL IVPB  500 mg Intravenous Q6H PRN Irving Copas, PA-C       metoCLOPramide (REGLAN) tablet 5-10 mg  5-10 mg Oral Q8H PRN Irving Copas, PA-C       Or   metoCLOPramide (REGLAN) injection 5-10 mg  5-10 mg Intravenous Q8H PRN Irving Copas, PA-C       morphine (PF) 2 MG/ML injection 0.5-1 mg  0.5-1 mg Intravenous Q2H PRN Irving Copas, PA-C       multivitamin with minerals tablet 1 tablet  1 tablet Oral Daily Nita Sells, MD   1 tablet at 12/22/22 1015   ondansetron (ZOFRAN) tablet 4 mg  4 mg Oral Q6H PRN Irving Copas, PA-C       Or   ondansetron [facility]) injection 4 mg  4 mg Intravenous Q6H PRN Costella Hatcher R, PA-C       polyethylene glycol (MIRALAX / GLYCOLAX) packet 17 g  17 g Oral BID Irving Copas, PA-C   17 g at 12/22/22 1013   sertraline (ZOLOFT) tablet 100 mg  100 mg Oral Daily Nita Sells, MD   100 mg at 12/22/22 1014     Discharge Medications: Please see discharge summary for a list of discharge medications.  Relevant Imaging Results:  Relevant Lab Results:   Additional Information SS# [id number]  Lennart Pall, LCSW

## 2022-12-22 NOTE — Plan of Care (Signed)
°  Problem: Education: °Goal: Knowledge of General Education information will improve °Description: Including pain rating scale, medication(s)/side effects and non-pharmacologic comfort measures °Outcome: Progressing °  °Problem: Activity: °Goal: Risk for activity intolerance will decrease °Outcome: Progressing °  °Problem: Elimination: °Goal: Will not experience complications related to urinary retention °Outcome: Progressing °  °

## 2022-12-22 NOTE — Plan of Care (Signed)
Plan of care reviewed and discussed with the patient and her daughter. 

## 2022-12-23 ENCOUNTER — Inpatient Hospital Stay (HOSPITAL_COMMUNITY): Payer: PPO

## 2022-12-23 ENCOUNTER — Encounter (HOSPITAL_COMMUNITY): Payer: Self-pay | Admitting: Orthopedic Surgery

## 2022-12-23 DIAGNOSIS — S72001A Fracture of unspecified part of neck of right femur, initial encounter for closed fracture: Secondary | ICD-10-CM | POA: Diagnosis not present

## 2022-12-23 LAB — CBC
HCT: 28.9 % — ABNORMAL LOW (ref 36.0–46.0)
Hemoglobin: 10 g/dL — ABNORMAL LOW (ref 12.0–15.0)
MCH: 33.8 pg (ref 26.0–34.0)
MCHC: 34.6 g/dL (ref 30.0–36.0)
MCV: 97.6 fL (ref 80.0–100.0)
Platelets: 136 10*3/uL — ABNORMAL LOW (ref 150–400)
RBC: 2.96 MIL/uL — ABNORMAL LOW (ref 3.87–5.11)
RDW: 12.3 % (ref 11.5–15.5)
WBC: 10.9 10*3/uL — ABNORMAL HIGH (ref 4.0–10.5)
nRBC: 0 % (ref 0.0–0.2)

## 2022-12-23 LAB — BASIC METABOLIC PANEL
Anion gap: 8 (ref 5–15)
BUN: 12 mg/dL (ref 8–23)
CO2: 30 mmol/L (ref 22–32)
Calcium: 8 mg/dL — ABNORMAL LOW (ref 8.9–10.3)
Chloride: 96 mmol/L — ABNORMAL LOW (ref 98–111)
Creatinine, Ser: 0.61 mg/dL (ref 0.44–1.00)
GFR, Estimated: >60 mL/min (ref >60)
Glucose, Bld: 132 mg/dL — ABNORMAL HIGH (ref 70–99)
Potassium: 3.1 mmol/L — ABNORMAL LOW (ref 3.5–5.1)
Sodium: 134 mmol/L — ABNORMAL LOW (ref 135–145)

## 2022-12-23 MED ORDER — POTASSIUM CHLORIDE CRYS ER 20 MEQ PO TBCR
40.0000 meq | EXTENDED_RELEASE_TABLET | Freq: Every day | ORAL | Status: DC
  Administered 2022-12-23 – 2022-12-25 (×3): 40 meq via ORAL
  Filled 2022-12-23 (×3): qty 2

## 2022-12-23 MED ORDER — SALINE SPRAY 0.65 % NA SOLN
1.0000 | Freq: Four times a day (QID) | NASAL | Status: DC
  Administered 2022-12-23 – 2022-12-25 (×10): 1 via NASAL
  Filled 2022-12-23: qty 44

## 2022-12-23 MED ORDER — FUROSEMIDE 10 MG/ML IJ SOLN
20.0000 mg | Freq: Once | INTRAMUSCULAR | Status: AC
  Administered 2022-12-23: 20 mg via INTRAVENOUS
  Filled 2022-12-23: qty 2

## 2022-12-23 MED ORDER — LORATADINE 10 MG PO TABS
10.0000 mg | ORAL_TABLET | Freq: Every day | ORAL | Status: DC
  Administered 2022-12-23 – 2022-12-24 (×2): 10 mg via ORAL
  Filled 2022-12-23 (×2): qty 1

## 2022-12-23 NOTE — TOC Progression Note (Signed)
Transition of Care [facility]) - Progression Note   Patient Details  Name: Brittany Douglas MRN: [id number] Date of Birth: December 30, 1940  Transition of Care [facility]) CM/SW [place], LCSW Phone Number: 12/23/2022, 3:16 PM  Clinical Narrative: CSW received call from HTA. Patient has been approved for SNF [facility] #[id number]) and [facility] (auth# [id number]). CSW provided patient with SNF bed offers and she requested that CSW follow up with her daughter, [name]. CSW spoke with daughter, who requested [name]. CSW reached out to admissions with [name] twice today, but was unable to reach anyone or receive call back. Daughter chose [facility]. CSW confirmed with [name] in admissions at [facility] that the facility can accept her tomorrow. CSW updated [name] with HTA regarding patient's bed choice.   Expected Discharge Plan: [place] Barriers to Discharge: Insurance Authorization, SNF Pending bed offer, Continued Medical Work up  Expected Discharge Plan and Services In-house Referral: Clinical Social Work Post Acute Care Choice: [place] Living arrangements for the past 2 months: Single Family Home           DME Arranged: N/A DME Agency: NA  Social Determinants of Health (SDOH) Interventions [place]: Low Risk  (12/20/2022)  Tobacco Use: Low Risk  (12/19/2022)   Readmission Risk Interventions    12/22/2022    3:33 PM  Readmission Risk Prevention Plan  Post Dischage Appt Complete  Medication Screening Complete  Transportation Screening Complete

## 2022-12-23 NOTE — Progress Notes (Signed)
PROGRESS NOTE   Brittany Douglas  JQB:[id number] DOB: 1941-03-29 DOA: 12/20/2022 PCP: [name], MD  Brief Narrative:  30 home dwell known HTN HLD IBS anxiety lost balance at home fell with pain in right hip Found to have acute proximal R femoral #  Orthopedics Dr. Alvan Dame consulted, right total hip replacement performed 1/7   Hospital-Problem based course  Right hip fracture status post repair - Follow orthopedic protocol, Tylenol + Norco for moderate pain IV morphine for severe pain, adjunctive Robaxin as needed - WBAT as per Ortho, DVT prophylaxis ASA 81 twice daily -Therapy has evaluated patient will need skilled placement  Hypoxia, low-grade fever overnight 1/8 and mild leukocytosis - Chest x-ray ordered 1/9 shows no overt pneumonia Given Lasix 40 IV X1 1/8 will repeat 20 X1 x 1 now - Hopeful to avoid need for oxygen but will reevaluate-May need to discharge on oxygen  Osteopenia/osteoporosis - Holding alendronate, is supposed to get Reclast injections this will be held for now  Hypokalemia - Replace with K-Dur 40 - Check magnesium in a.m. along with labs  Hypothyroidism - Resume Synthroid 25, continue specialized dosing  HTN - Continue losartan 100 daily, amlodipine 5 daily - Monitor trends  Anxiety - Continue Zoloft 100, diazepam 2 mg every 6 as needed   DVT prophylaxis: Lovenox at this time Code Status: DNR noted Family Communication: Discussed with daughter on telephone earlier Disposition:  Status is: Inpatient Remains inpatient appropriate because:   Requires skilled facility placement  Subjective:  Looks comfortable but has some discomfort in hip No fever no chills although low-grade temps overnight ROM is intact she is out in the chair today but it was somewhat uncomfortable   Objective: Vitals:   12/22/22 2142 12/23/22 0436 12/23/22 0621 12/23/22 1313  BP: (!) 149/62 (!) 159/81 (!) 158/65 (!) 112/55  Pulse: 85 91 74 80  Resp: '17 16 17  16  '$ Temp: 99.1 F (37.3 C) 99.4 F (37.4 C) 98.2 F (36.8 C) 98.4 F (36.9 C)  TempSrc: Oral Oral Oral Oral  SpO2: 99% 95% 96% 97%  Weight:      Height:        Intake/Output Summary (Last 24 hours) at 12/23/2022 1357 Last data filed at 12/23/2022 1318 Gross per 24 hour  Intake 960 ml  Output 1600 ml  Net -640 ml    Filed Weights   12/19/22 2321  Weight: 56.7 kg    Examination:  No icterus no pallor ROM is intact No wheeze but chest is overall clear S1-S2 no murmur no rub no gallop ROM intact Abdomen is soft no rebound  Data Reviewed: personally reviewed   CBC    Component Value Date/Time   WBC 10.9 (H) 12/23/2022 0348   RBC 2.96 (L) 12/23/2022 0348   HGB 10.0 (L) 12/23/2022 0348   HCT 28.9 (L) 12/23/2022 0348   PLT 136 (L) 12/23/2022 0348   MCV 97.6 12/23/2022 0348   MCH 33.8 12/23/2022 0348   MCHC 34.6 12/23/2022 0348   RDW 12.3 12/23/2022 0348   LYMPHSABS 0.7 12/20/2022 0045   MONOABS 0.8 12/20/2022 0045   EOSABS 0.0 12/20/2022 0045   BASOSABS 0.0 12/20/2022 0045      Latest Ref Rng & Units 12/23/2022    3:48 AM 12/22/2022    3:22 AM 12/20/2022   12:45 AM  CMP  Glucose 70 - 99 mg/dL 132  147  122   BUN 8 - 23 mg/dL '12  9  27   '$ Creatinine  0.44 - 1.00 mg/dL 0.61  0.65  0.95   Sodium 135 - 145 mmol/L 134  134  134   Potassium 3.5 - 5.1 mmol/L 3.1  3.5  4.8   Chloride 98 - 111 mmol/L 96  99  95   CO2 22 - 32 mmol/L '30  30  26   '$ Calcium 8.9 - 10.3 mg/dL 8.0  7.8  9.8      Radiology Studies: DG Chest 2 View  Result Date: 12/23/2022 CLINICAL DATA:  Hospital-acquired pneumonia. EXAM: CHEST - 2 VIEW COMPARISON:  12/20/2022 x-ray and older FINDINGS: Calcified aorta. Underinflation with diffuse interstitial changes again noted. Subtle left lung base opacity. Minimal patchy right midlung opacity as well. Subtle infiltrate is possible. No pneumothorax or edema. Small effusions on the lateral view. Stable cardiopericardial silhouette. Degenerative changes. IMPRESSION:  Worsening inflation with tiny effusions. Subtle areas of opacity right mid lung and left lung base. Recommend follow-up Electronically Signed   By: Jill Side M.D.   On: 12/23/2022 13:07   DG HIP UNILAT WITH PELVIS 2-3 VIEWS RIGHT  Result Date: 12/22/2022 CLINICAL DATA:  Status post total right hip arthroplasty. EXAM: DG HIP (WITH OR WITHOUT PELVIS) 2-3V RIGHT COMPARISON:  AP pelvis 12/21/2022 FINDINGS: Redemonstration of recent total right hip arthroplasty. No perihardware lucency is seen to indicate hardware failure or loosening. Expected postoperative subcutaneous air about the proximal right thigh is slightly decreased from prior. Mild left femoroacetabular joint space narrowing and superolateral left acetabular degenerative osteophytes. The bilateral sacroiliac joint spaces are maintained. Mild pubic symphysis joint space narrowing and subchondral sclerosis. IMPRESSION: Status post total right hip arthroplasty without evidence of hardware failure. Electronically Signed   By: Yvonne Kendall M.D.   On: 12/22/2022 08:27     Scheduled Meds:  aspirin  81 mg Oral BID   docusate sodium  100 mg Oral BID   feeding supplement  237 mL Oral BID BM   furosemide  20 mg Intravenous Once   levothyroxine  25 mcg Oral QAC breakfast   loratadine  10 mg Oral QHS   losartan  100 mg Oral Daily   multivitamin with minerals  1 tablet Oral Daily   polyethylene glycol  17 g Oral BID   potassium chloride  40 mEq Oral Daily   sertraline  100 mg Oral Daily   sodium chloride  1 spray Each Nare QID   Continuous Infusions:  lactated ringers 100 mL/hr at 12/21/22 1007   methocarbamol (ROBAXIN) IV       LOS: 3 days   Time spent:33  Nita Sells, MD [facility] To contact the attending provider between 7A-7P or the covering provider during after hours 7P-7A, please log into the web site [email or web address] and access using universal [place] password for that web site. If you do not have the password,  please call the hospital operator.  12/23/2022, 1:57 PM

## 2022-12-23 NOTE — Progress Notes (Signed)
Patient ID: Brittany Douglas, female   DOB: 1941/09/15, 82 y.o.   MRN: [id number] Subjective: 2 Days Post-Op Procedure(s) (LRB): TOTAL HIP ARTHROPLASTY ANTERIOR APPROACH (Right)    Patient reports pain as moderate. Has expected complaints of right thigh soreness with attempted motion Raises concerns that she has not been taking her "Claritin" which she uses nightly as well as saline nasal spray  Objective:   VITALS:   Vitals:   12/23/22 0436 12/23/22 0621  BP: (!) 159/81 (!) 158/65  Pulse: 91 74  Resp: 16 17  Temp: 99.4 F (37.4 C) 98.2 F (36.8 C)  SpO2: 95% 96%    Neurovascular intact Incision: dressing C/D/I  LABS Recent Labs    12/22/22 0322 12/23/22 0348  HGB 9.9* 10.0*  HCT 29.2* 28.9*  WBC 9.0 10.9*  PLT 118* 136*    Recent Labs    12/22/22 0322 12/23/22 0348  NA 134* 134*  K 3.5 3.1*  BUN 9 12  CREATININE 0.65 0.61  GLUCOSE 147* 132*    No results for input(s): "LABPT", "INR" in the last 72 hours.   Assessment/Plan: 2 Days Post-Op Procedure(s) (LRB): TOTAL HIP ARTHROPLASTY ANTERIOR APPROACH (Right)   Up with therapy Continue to work with therapy with OOB activity to work towards discharge Consider requests for antihistamine and saline nasal spray

## 2022-12-23 NOTE — Plan of Care (Signed)
  Problem: Clinical Measurements: Goal: Ability to maintain clinical measurements within normal limits will improve Outcome: Progressing   Problem: Activity: Goal: Risk for activity intolerance will decrease Outcome: Progressing   Problem: Coping: Goal: Level of anxiety will decrease Outcome: Progressing   

## 2022-12-24 ENCOUNTER — Inpatient Hospital Stay (HOSPITAL_COMMUNITY): Payer: PPO

## 2022-12-24 DIAGNOSIS — E876 Hypokalemia: Secondary | ICD-10-CM | POA: Diagnosis not present

## 2022-12-24 DIAGNOSIS — I10 Essential (primary) hypertension: Secondary | ICD-10-CM

## 2022-12-24 DIAGNOSIS — E039 Hypothyroidism, unspecified: Secondary | ICD-10-CM

## 2022-12-24 DIAGNOSIS — S72001A Fracture of unspecified part of neck of right femur, initial encounter for closed fracture: Secondary | ICD-10-CM | POA: Diagnosis not present

## 2022-12-24 DIAGNOSIS — J9601 Acute respiratory failure with hypoxia: Secondary | ICD-10-CM | POA: Diagnosis not present

## 2022-12-24 DIAGNOSIS — F411 Generalized anxiety disorder: Secondary | ICD-10-CM

## 2022-12-24 LAB — RESPIRATORY PANEL BY PCR

## 2022-12-24 LAB — BASIC METABOLIC PANEL
Anion gap: 7 (ref 5–15)
BUN: 20 mg/dL (ref 8–23)
CO2: 31 mmol/L (ref 22–32)
Calcium: 8 mg/dL — ABNORMAL LOW (ref 8.9–10.3)
Chloride: 98 mmol/L (ref 98–111)
Creatinine, Ser: 0.66 mg/dL (ref 0.44–1.00)
GFR, Estimated: >60 mL/min (ref >60)
Glucose, Bld: 127 mg/dL — ABNORMAL HIGH (ref 70–99)
Potassium: 3.3 mmol/L — ABNORMAL LOW (ref 3.5–5.1)
Sodium: 136 mmol/L (ref 135–145)

## 2022-12-24 LAB — CBC WITH DIFFERENTIAL/PLATELET
Abs Immature Granulocytes: 0.07 10*3/uL (ref 0.00–0.07)
Basophils Absolute: 0 10*3/uL (ref 0.0–0.1)
Basophils Relative: 1 %
Eosinophils Absolute: 0.3 10*3/uL (ref 0.0–0.5)
Eosinophils Relative: 4 %
HCT: 29.8 % — ABNORMAL LOW (ref 36.0–46.0)
Hemoglobin: 10 g/dL — ABNORMAL LOW (ref 12.0–15.0)
Immature Granulocytes: 1 %
Lymphocytes Relative: 12 %
Lymphs Abs: 1 10*3/uL (ref 0.7–4.0)
MCH: 33.2 pg (ref 26.0–34.0)
MCHC: 33.6 g/dL (ref 30.0–36.0)
MCV: 99 fL (ref 80.0–100.0)
Monocytes Absolute: 0.7 10*3/uL (ref 0.1–1.0)
Monocytes Relative: 9 %
Neutro Abs: 6.1 10*3/uL (ref 1.7–7.7)
Neutrophils Relative %: 73 %
Platelets: 145 10*3/uL — ABNORMAL LOW (ref 150–400)
RBC: 3.01 MIL/uL — ABNORMAL LOW (ref 3.87–5.11)
RDW: 12.7 % (ref 11.5–15.5)
WBC: 8.2 10*3/uL (ref 4.0–10.5)
nRBC: 0 % (ref 0.0–0.2)

## 2022-12-24 LAB — BRAIN NATRIURETIC PEPTIDE: B Natriuretic Peptide: 306.9 pg/mL — ABNORMAL HIGH (ref 0.0–100.0)

## 2022-12-24 LAB — RESP PANEL BY RT-PCR (RSV, FLU A&B, COVID)  RVPGX2
Influenza A by PCR: NEGATIVE
Influenza B by PCR: NEGATIVE
Resp Syncytial Virus by PCR: NEGATIVE
SARS Coronavirus 2 by RT PCR: NEGATIVE

## 2022-12-24 MED ORDER — HYDRALAZINE HCL 20 MG/ML IJ SOLN: 10.0000 mg | Freq: Four times a day (QID) | INTRAMUSCULAR | Status: DC | PRN

## 2022-12-24 MED ORDER — IOHEXOL 350 MG/ML SOLN
100.0000 mL | Freq: Once | INTRAVENOUS | Status: AC | PRN
  Administered 2022-12-24: 75 mL via INTRAVENOUS

## 2022-12-24 MED ORDER — ACETAMINOPHEN 325 MG PO TABS
650.0000 mg | ORAL_TABLET | Freq: Two times a day (BID) | ORAL | Status: DC
  Administered 2022-12-24 – 2022-12-25 (×2): 650 mg via ORAL
  Filled 2022-12-24 (×2): qty 2

## 2022-12-24 MED ORDER — SODIUM CHLORIDE 0.9 % IV SOLN
500.0000 mg | Freq: Every day | INTRAVENOUS | Status: DC
  Administered 2022-12-24: 500 mg via INTRAVENOUS
  Filled 2022-12-24 (×2): qty 5

## 2022-12-24 MED ORDER — SODIUM CHLORIDE (PF) 0.9 % IJ SOLN
INTRAMUSCULAR | Status: AC
  Filled 2022-12-24: qty 50

## 2022-12-24 MED ORDER — SODIUM CHLORIDE 0.9 % IV SOLN
2.0000 g | Freq: Every day | INTRAVENOUS | Status: DC
  Administered 2022-12-24: 2 g via INTRAVENOUS
  Filled 2022-12-24: qty 20

## 2022-12-24 NOTE — Assessment & Plan Note (Signed)
Patient/family report mechanical fall with no loss of consciousness It is possible that an underlying illness such as a pneumonia may have predisposed the patient to this fall Remainder of assessment and plan as above

## 2022-12-24 NOTE — TOC Progression Note (Signed)
Transition of Care [facility]) - Progression Note   Patient Details  Name: Brittany Douglas MRN: [id number] Date of Birth: 1940-12-25  Transition of Care [facility]) CM/SW [place], LCSW Phone Number: 12/24/2022, 1:16 PM  Clinical Narrative: Patient was expected to discharge to [facility] for rehab today, but discharge will now be delayed as additional labs are needed. CSW notified [name] in admissions at [place]. Per [name], the patient can be admitted tomorrow if medically stable and the COVID test is negative. TOC to follow.  Expected Discharge Plan: [place] Barriers to Discharge: Insurance Authorization, SNF Pending bed offer, Continued Medical Work up  Expected Discharge Plan and Services In-house Referral: Clinical Social Work Post Acute Care Choice: [place] Living arrangements for the past 2 months: Single Family Home           DME Arranged: N/A DME Agency: NA  Social Determinants of Health (SDOH) Interventions [place]: Low Risk  (12/20/2022)  Tobacco Use: Low Risk  (12/23/2022)   Readmission Risk Interventions    12/22/2022    3:33 PM  Readmission Risk Prevention Plan  Post Dischage Appt Complete  Medication Screening Complete  Transportation Screening Complete

## 2022-12-24 NOTE — ED Notes (Signed)
Pt needs a new 20g IV in the forearm or higher for PE study in CT. RN messaged.

## 2022-12-24 NOTE — Assessment & Plan Note (Signed)
·   Replacing with potassium chloride °· Evaluating for concurrent hypomagnesemia  °· Monitoring potassium levels with serial chemistries. ° °

## 2022-12-24 NOTE — Assessment & Plan Note (Signed)
.   Resume home regimen of Synthroid 

## 2022-12-24 NOTE — Assessment & Plan Note (Signed)
Patient is status post right total hip arthroplasty performed on 1/7 by Dr. Alvan Dame Patient participating in PT/OT Recommendations for skilled physical therapy services in a skilled nursing facility Initiating scheduled Tylenol with as needed opiate-based analgesics for breakthrough pain Obtaining vitamin D level

## 2022-12-24 NOTE — Hospital Course (Signed)
82 year old female with past medical history of hypothyroidism, hypertension, anxiety disorder, depression, hyperlipidemia presenting to [facility] emergency room with complaints of right hip pain status post fall at home.  Upon evaluation in the emergency department patient was found to have an acute fracture of the right hip.  Dr. Alvan Dame with orthopedic surgery was consulted and patient underwent total hip arthroplasty on 1/7.  Postoperative course was complicated by hypoxia.  This was thoroughly worked up.  Patient was found to have a slightly elevated BNP however patient did not appear clinically volume overloaded and echocardiogram revealed a preserved ejection fraction of 65% with grade 1 diastolic dysfunction.  D-dimer was slightly elevated and therefore patient underwent CT angiogram of the chest.  This was negative for pulmonary embolism but did reveal patchy bilateral infiltrates concerning for an infectious process.  Patient was initiated on ceftriaxone and azithromycin for treatment of an early multifocal pneumonia.  After the initiation of antibiotics patient quickly improved with resolution of hypoxia.  Patient was able to be weaned off of the supplemental oxygen.  Any associated encephalopathy also quickly resolved.  Physical therapy has felt the patient would benefit from skilled physical therapy services in a skilled nursing facility.  TOC is currently assisting with this disposition.  Arranges were made for the patient to be discharged to [facility] skilled nursing facility.  Patient was transitioned to oral Omnicef and azithromycin to complete a 5-day course of antibiotics.  Patient was discharged in improved and stable condition on 12/25/2022.

## 2022-12-24 NOTE — Plan of Care (Signed)
Plan of care reviewed and discussed. °

## 2022-12-24 NOTE — Progress Notes (Signed)
PROGRESS NOTE   Brittany Douglas  KGU:[id number] DOB: 1941/04/22 DOA: 12/20/2022 PCP: [name], MD   Date of Service: the patient was seen and examined on 12/24/2022  Brief Narrative:  82 year old female with past medical history of hypothyroidism, hypertension, anxiety disorder, depression, hyperlipidemia presenting to [facility] emergency room with complaints of right hip pain status post fall at home.  Upon evaluation in the emergency department patient was found to have an acute fracture of the right hip.  Dr. Alvan Dame with orthopedic surgery was consulted and patient underwent total hip arthroplasty on 1/7.  Postoperative course has been complicated by transient hypoxia addressed via doses of intravenous Lasix and supplemental oxygen.  Physical therapy has felt the patient would benefit from skilled physical therapy services in a skilled nursing facility.  TOC is currently assisting with this disposition.     Assessment and Plan: * Fracture of femoral neck, right, closed [facility]) Patient is status post right total hip arthroplasty performed on 1/7 by Dr. Alvan Dame Patient participating in PT/OT Recommendations for skilled physical therapy services in a skilled nursing facility Initiating scheduled Tylenol with as needed opiate-based analgesics for breakthrough pain Obtaining vitamin D level  Fall at home, initial encounter Patient/family report mechanical fall with no loss of consciousness It is possible that an underlying illness such as a pneumonia may have predisposed the patient to this fall Remainder of assessment and plan as above  Acute respiratory failure with hypoxia ([place]) Patient exhibiting acute hypoxia identified on this hospitalization with patient frequently exhibiting pulse oximetry in the 80s with any attempt to discontinue oxygen. CT angiogram of the chest reveals no evidence of pulmonary embolism but does reveal small bilateral pleural effusions and  patchy bilateral infiltrates Obtaining COVID-19 and influenza and RSV PCR panel Will additionally obtain full respiratory viral panel Obtaining cardiogram Obtaining blood cultures and procalcitonin In the meantime we will treat patient with a course of intravenous ceftriaxone and azithromycin Continued supplemental oxygen to maintain oxygen saturations of greater than 92%  Hypokalemia Replacing with potassium chloride Evaluating for concurrent hypomagnesemia  Monitoring potassium levels with serial chemistries.   Essential hypertension Resume patients home regimen of losartan  Last blood pressure targets considering advanced age. PRN intravenous antihypertensives for excessively elevated blood pressure   Hypothyroidism Resume home regimen of Synthroid   Generalized anxiety disorder Continue home regimen of Zoloft and as needed Valium    Subjective:  Patient is complaining of right hip pain, moderate tin intensity, sharp in quality, radiating distally with movement of the affected extremity although somewhat improved compared to yesterday.  Physical Exam:  Vitals:   12/23/22 1313 12/23/22 2215 12/24/22 0555 12/24/22 1447  BP: (!) 112/55 (!) 133/51 (!) 159/84 (!) 128/49  Pulse: 80 86 86 81  Resp: '16 17 18 17  '$ Temp: 98.4 F (36.9 C) 98.6 F (37 C) 98.2 F (36.8 C) 97.7 F (36.5 C)  TempSrc: Oral Oral Oral Oral  SpO2: 97% 97% 98% 100%  Weight:      Height:        Constitutional: Awake alert and oriented x3, patient is in mild distress due to pain. Skin: Dressing of the right hip is clean dry and intact.  Patient exhibits poor skin turgor. Eyes: Pupils are equally reactive to light.  No evidence of scleral icterus or conjunctival pallor.  ENMT: Moist mucous membranes noted.  Posterior pharynx clear of any exudate or lesions.   Respiratory: Significant rales noted to the bilateral lower and mid fields.  No evidence of significant associated wheezing.  Normal  respiratory effort. No accessory muscle use.  Cardiovascular: Regular rate and rhythm, no murmurs / rubs / gallops. No extremity edema. 2+ pedal pulses. No carotid bruits.  Abdomen: Abdomen is soft and nontender.  No evidence of intra-abdominal masses.  Positive bowel sounds noted in all quadrants.   Musculoskeletal: Notable pain with both passive and active range of motion of the right hip.  Good ROM, no contractures. Normal muscle tone.    Data Reviewed:  I have personally reviewed and interpreted labs, imaging.  Significant findings are   CBC: Recent Labs  Lab 12/20/22 0045 12/22/22 0322 12/23/22 0348 12/24/22 0347  WBC 13.6* 9.0 10.9* 8.2  NEUTROABS 12.0*  --   --  6.1  HGB 12.7 9.9* 10.0* 10.0*  HCT 36.7 29.2* 28.9* 29.8*  MCV 96.8 99.3 97.6 99.0  PLT 176 118* 136* 341*   Basic Metabolic Panel: Recent Labs  Lab 12/20/22 0045 12/22/22 0322 12/23/22 0348 12/24/22 0347  NA 134* 134* 134* 136  K 4.8 3.5 3.1* 3.3*  CL 95* 99 96* 98  CO2 '26 30 30 31  '$ GLUCOSE 122* 147* 132* 127*  BUN 27* '9 12 20  '$ CREATININE 0.95 0.65 0.61 0.66  CALCIUM 9.8 7.8* 8.0* 8.0*   Coagulation Profile: Recent Labs  Lab 12/20/22 0045  INR 1.0    Code Status:  DNR.  Code status decision has been confirmed with: patient Family Communication: Case has been discussed at length with patient's daughter via phone conversation   Severity of Illness:  The appropriate patient status for this patient is INPATIENT. Inpatient status is judged to be reasonable and necessary in order to provide the required intensity of service to ensure the patient's safety. The patient's presenting symptoms, physical exam findings, and initial radiographic and laboratory data in the context of their chronic comorbidities is felt to place them at high risk for further clinical deterioration. Furthermore, it is not anticipated that the patient will be medically stable for discharge from the hospital within 2 midnights of  admission.   * I certify that at the point of admission it is my clinical judgment that the patient will require inpatient hospital care spanning beyond 2 midnights from the point of admission due to high intensity of service, high risk for further deterioration and high frequency of surveillance required.*  Time spent:  57 minutes  Author:  Vernelle Emerald MD  12/24/2022 9:41 PM

## 2022-12-24 NOTE — Assessment & Plan Note (Signed)
Patient exhibiting acute hypoxia identified on this hospitalization with patient frequently exhibiting pulse oximetry in the 80s with any attempt to discontinue oxygen. CT angiogram of the chest reveals no evidence of pulmonary embolism but does reveal small bilateral pleural effusions and patchy bilateral infiltrates Obtaining COVID-19 and influenza and RSV PCR panel Will additionally obtain full respiratory viral panel Obtaining cardiogram Obtaining blood cultures and procalcitonin In the meantime we will treat patient with a course of intravenous ceftriaxone and azithromycin Continued supplemental oxygen to maintain oxygen saturations of greater than 92%

## 2022-12-24 NOTE — Assessment & Plan Note (Signed)
Continue home regimen of Zoloft and as needed Valium

## 2022-12-24 NOTE — Assessment & Plan Note (Signed)
Resume patients home regimen of losartan  Last blood pressure targets considering advanced age. PRN intravenous antihypertensives for excessively elevated blood pressure

## 2022-12-25 ENCOUNTER — Inpatient Hospital Stay (HOSPITAL_COMMUNITY): Payer: PPO

## 2022-12-25 DIAGNOSIS — I509 Heart failure, unspecified: Secondary | ICD-10-CM | POA: Diagnosis not present

## 2022-12-25 DIAGNOSIS — I10 Essential (primary) hypertension: Secondary | ICD-10-CM | POA: Diagnosis not present

## 2022-12-25 DIAGNOSIS — J189 Pneumonia, unspecified organism: Secondary | ICD-10-CM | POA: Diagnosis present

## 2022-12-25 DIAGNOSIS — E039 Hypothyroidism, unspecified: Secondary | ICD-10-CM | POA: Diagnosis not present

## 2022-12-25 DIAGNOSIS — S79919A Unspecified injury of unspecified hip, initial encounter: Secondary | ICD-10-CM | POA: Diagnosis not present

## 2022-12-25 DIAGNOSIS — J9601 Acute respiratory failure with hypoxia: Secondary | ICD-10-CM | POA: Diagnosis not present

## 2022-12-25 DIAGNOSIS — L22 Diaper dermatitis: Secondary | ICD-10-CM | POA: Diagnosis not present

## 2022-12-25 DIAGNOSIS — S72001D Fracture of unspecified part of neck of right femur, subsequent encounter for closed fracture with routine healing: Secondary | ICD-10-CM | POA: Diagnosis not present

## 2022-12-25 DIAGNOSIS — R2689 Other abnormalities of gait and mobility: Secondary | ICD-10-CM | POA: Diagnosis not present

## 2022-12-25 DIAGNOSIS — R52 Pain, unspecified: Secondary | ICD-10-CM | POA: Diagnosis not present

## 2022-12-25 DIAGNOSIS — J309 Allergic rhinitis, unspecified: Secondary | ICD-10-CM | POA: Diagnosis not present

## 2022-12-25 DIAGNOSIS — Y92009 Unspecified place in unspecified non-institutional (private) residence as the place of occurrence of the external cause: Secondary | ICD-10-CM

## 2022-12-25 DIAGNOSIS — F419 Anxiety disorder, unspecified: Secondary | ICD-10-CM | POA: Diagnosis not present

## 2022-12-25 DIAGNOSIS — E786 Lipoprotein deficiency: Secondary | ICD-10-CM | POA: Diagnosis not present

## 2022-12-25 DIAGNOSIS — W19XXXA Unspecified fall, initial encounter: Secondary | ICD-10-CM | POA: Diagnosis not present

## 2022-12-25 DIAGNOSIS — Z471 Aftercare following joint replacement surgery: Secondary | ICD-10-CM | POA: Diagnosis not present

## 2022-12-25 DIAGNOSIS — R051 Acute cough: Secondary | ICD-10-CM | POA: Diagnosis not present

## 2022-12-25 DIAGNOSIS — Z7401 Bed confinement status: Secondary | ICD-10-CM | POA: Diagnosis not present

## 2022-12-25 DIAGNOSIS — Z96641 Presence of right artificial hip joint: Secondary | ICD-10-CM | POA: Diagnosis not present

## 2022-12-25 DIAGNOSIS — M1990 Unspecified osteoarthritis, unspecified site: Secondary | ICD-10-CM | POA: Diagnosis not present

## 2022-12-25 DIAGNOSIS — E7800 Pure hypercholesterolemia, unspecified: Secondary | ICD-10-CM | POA: Diagnosis not present

## 2022-12-25 DIAGNOSIS — E876 Hypokalemia: Secondary | ICD-10-CM | POA: Diagnosis not present

## 2022-12-25 DIAGNOSIS — S71001A Unspecified open wound, right hip, initial encounter: Secondary | ICD-10-CM | POA: Diagnosis not present

## 2022-12-25 DIAGNOSIS — M6281 Muscle weakness (generalized): Secondary | ICD-10-CM | POA: Diagnosis not present

## 2022-12-25 DIAGNOSIS — R278 Other lack of coordination: Secondary | ICD-10-CM | POA: Diagnosis not present

## 2022-12-25 DIAGNOSIS — J181 Lobar pneumonia, unspecified organism: Secondary | ICD-10-CM | POA: Diagnosis not present

## 2022-12-25 DIAGNOSIS — E569 Vitamin deficiency, unspecified: Secondary | ICD-10-CM | POA: Diagnosis not present

## 2022-12-25 DIAGNOSIS — K589 Irritable bowel syndrome without diarrhea: Secondary | ICD-10-CM | POA: Diagnosis not present

## 2022-12-25 DIAGNOSIS — E785 Hyperlipidemia, unspecified: Secondary | ICD-10-CM | POA: Diagnosis not present

## 2022-12-25 DIAGNOSIS — S72009B Fracture of unspecified part of neck of unspecified femur, initial encounter for open fracture type I or II: Secondary | ICD-10-CM | POA: Diagnosis not present

## 2022-12-25 DIAGNOSIS — F411 Generalized anxiety disorder: Secondary | ICD-10-CM | POA: Diagnosis not present

## 2022-12-25 DIAGNOSIS — F32A Depression, unspecified: Secondary | ICD-10-CM | POA: Diagnosis not present

## 2022-12-25 DIAGNOSIS — M25551 Pain in right hip: Secondary | ICD-10-CM | POA: Diagnosis not present

## 2022-12-25 DIAGNOSIS — R0602 Shortness of breath: Secondary | ICD-10-CM | POA: Diagnosis not present

## 2022-12-25 DIAGNOSIS — S72001A Fracture of unspecified part of neck of right femur, initial encounter for closed fracture: Secondary | ICD-10-CM | POA: Diagnosis not present

## 2022-12-25 LAB — CBC WITH DIFFERENTIAL/PLATELET
Abs Immature Granulocytes: 0.06 10*3/uL (ref 0.00–0.07)
Basophils Absolute: 0 10*3/uL (ref 0.0–0.1)
Basophils Relative: 0 %
Eosinophils Absolute: 0.3 10*3/uL (ref 0.0–0.5)
Eosinophils Relative: 4 %
HCT: 28.5 % — ABNORMAL LOW (ref 36.0–46.0)
Hemoglobin: 9.6 g/dL — ABNORMAL LOW (ref 12.0–15.0)
Immature Granulocytes: 1 %
Lymphocytes Relative: 12 %
Lymphs Abs: 0.9 10*3/uL (ref 0.7–4.0)
MCH: 33.2 pg (ref 26.0–34.0)
MCHC: 33.7 g/dL (ref 30.0–36.0)
MCV: 98.6 fL (ref 80.0–100.0)
Monocytes Absolute: 0.8 10*3/uL (ref 0.1–1.0)
Monocytes Relative: 10 %
Neutro Abs: 5.9 10*3/uL (ref 1.7–7.7)
Neutrophils Relative %: 73 %
Platelets: 169 10*3/uL (ref 150–400)
RBC: 2.89 MIL/uL — ABNORMAL LOW (ref 3.87–5.11)
RDW: 12.9 % (ref 11.5–15.5)
WBC: 7.9 10*3/uL (ref 4.0–10.5)
nRBC: 0 % (ref 0.0–0.2)

## 2022-12-25 LAB — COMPREHENSIVE METABOLIC PANEL
ALT: 37 U/L (ref 0–44)
AST: 37 U/L (ref 15–41)
Albumin: 2.7 g/dL — ABNORMAL LOW (ref 3.5–5.0)
Alkaline Phosphatase: 53 U/L (ref 38–126)
Anion gap: 8 (ref 5–15)
BUN: 15 mg/dL (ref 8–23)
CO2: 29 mmol/L (ref 22–32)
Calcium: 8.1 mg/dL — ABNORMAL LOW (ref 8.9–10.3)
Chloride: 103 mmol/L (ref 98–111)
Creatinine, Ser: 0.62 mg/dL (ref 0.44–1.00)
GFR, Estimated: >60 mL/min (ref >60)
Glucose, Bld: 121 mg/dL — ABNORMAL HIGH (ref 70–99)
Potassium: 4 mmol/L (ref 3.5–5.1)
Sodium: 140 mmol/L (ref 135–145)
Total Bilirubin: 0.7 mg/dL (ref 0.3–1.2)
Total Protein: 5.9 g/dL — ABNORMAL LOW (ref 6.5–8.1)

## 2022-12-25 LAB — ECHOCARDIOGRAM COMPLETE
AR max vel: 2.01 cm2
AV Area VTI: 2.1 cm2
AV Area mean vel: 2.13 cm2
AV Mean grad: 6 mmHg
AV Peak grad: 13.1 mmHg
Ao pk vel: 1.81 m/s
Area-P 1/2: 3.65 cm2
Calc EF: 63.3 %
Est EF: 65
MV M vel: 4.07 m/s
MV Peak grad: 66.2 mmHg
MV VTI: 1.52 cm2
S' Lateral: 1.8 cm
Single Plane A2C EF: 66.1 %
Single Plane A4C EF: 63 %

## 2022-12-25 LAB — PROCALCITONIN: Procalcitonin: 0.12 ng/mL

## 2022-12-25 LAB — MAGNESIUM: Magnesium: 2.1 mg/dL (ref 1.7–2.4)

## 2022-12-25 MED ORDER — ASPIRIN 81 MG PO CHEW: 81.0000 mg | CHEWABLE_TABLET | Freq: Two times a day (BID) | ORAL | 0 refills | Status: AC

## 2022-12-25 MED ORDER — ENSURE ENLIVE PO LIQD: 237.0000 mL | Freq: Two times a day (BID) | ORAL | 12 refills | Status: AC

## 2022-12-25 MED ORDER — ONDANSETRON HCL 4 MG PO TABS: 4.0000 mg | ORAL_TABLET | Freq: Four times a day (QID) | ORAL | 0 refills | Status: AC | PRN

## 2022-12-25 MED ORDER — ASPIRIN 81 MG PO TABS: 81.0000 mg | ORAL_TABLET | Freq: Every day | ORAL | Status: AC

## 2022-12-25 MED ORDER — CEFDINIR 300 MG PO CAPS: 300.0000 mg | ORAL_CAPSULE | Freq: Two times a day (BID) | ORAL | 0 refills | Status: AC

## 2022-12-25 MED ORDER — AZITHROMYCIN 500 MG PO TABS: 500.0000 mg | ORAL_TABLET | Freq: Every day | ORAL | 0 refills | Status: AC

## 2022-12-25 MED ORDER — ACETAMINOPHEN 325 MG PO TABS: 650.0000 mg | ORAL_TABLET | Freq: Two times a day (BID) | ORAL | Status: AC

## 2022-12-25 MED ORDER — POLYETHYLENE GLYCOL 3350 17 G PO PACK: 17.0000 g | PACK | Freq: Every day | ORAL | 0 refills | Status: AC | PRN

## 2022-12-25 MED ORDER — ACETAMINOPHEN 325 MG PO TABS: 325.0000 mg | ORAL_TABLET | Freq: Four times a day (QID) | ORAL | Status: AC | PRN

## 2022-12-25 MED ORDER — HYDROCODONE-ACETAMINOPHEN 5-325 MG PO TABS: 1.0000 | ORAL_TABLET | Freq: Four times a day (QID) | ORAL | 0 refills | Status: AC | PRN

## 2022-12-25 MED ORDER — METHOCARBAMOL 500 MG PO TABS: 500.0000 mg | ORAL_TABLET | Freq: Four times a day (QID) | ORAL | Status: AC | PRN

## 2022-12-25 NOTE — Plan of Care (Signed)
  Problem: Education: Goal: Knowledge of General Education information will improve Description: Including pain rating scale, medication(s)/side effects and non-pharmacologic comfort measures Outcome: Progressing   Problem: Health Behavior/Discharge Planning: Goal: Ability to manage health-related needs will improve Outcome: Progressing   Problem: Clinical Measurements: Goal: Ability to maintain clinical measurements within normal limits will improve Outcome: Progressing Goal: Will remain free from infection Outcome: Progressing Goal: Diagnostic test results will improve Outcome: Progressing Goal: Respiratory complications will improve Outcome: Progressing Goal: Cardiovascular complication will be avoided Outcome: Progressing   Problem: Activity: Goal: Risk for activity intolerance will decrease Outcome: Progressing   Problem: Coping: Goal: Level of anxiety will decrease Outcome: Progressing   Problem: Elimination: Goal: Will not experience complications related to bowel motility Outcome: Progressing   Problem: Pain Managment: Goal: General experience of comfort will improve Outcome: Progressing   Problem: Safety: Goal: Ability to remain free from injury will improve Outcome: Progressing   Problem: Skin Integrity: Goal: Risk for impaired skin integrity will decrease Outcome: Progressing   Problem: Education: Goal: Knowledge of the prescribed therapeutic regimen will improve Outcome: Progressing Goal: Understanding of discharge needs will improve Outcome: Progressing   Problem: Activity: Goal: Ability to avoid complications of mobility impairment will improve Outcome: Progressing Goal: Ability to tolerate increased activity will improve Outcome: Progressing   Problem: Clinical Measurements: Goal: Postoperative complications will be avoided or minimized Outcome: Progressing   Problem: Pain Management: Goal: Pain level will decrease with appropriate  interventions Outcome: Progressing   Problem: Skin Integrity: Goal: Will show signs of wound healing Outcome: Progressing

## 2022-12-25 NOTE — Discharge Summary (Signed)
Physician Discharge Summary   Patient: Brittany Douglas MRN: [id number] DOB: [birth year]  Admit date:     12/20/2022  Discharge date: 12/25/22  Discharge Physician: Vernelle Emerald   PCP: [name], MD   Recommendations at discharge:   Patient is to complete her course of antibiotic therapy as instructed. Patient is to follow-up as an outpatient with Dr. Alvan Dame with [facility] orthopedic surgery in 2 weeks. Patient is to weight-bear as tolerated using an assistive device. Patient is to participate in physical therapy regimen per the recommendations Patient is DNR Patient is to consume a regular diet. Patient is to be brought back to the emergency department if she develops worsening pain, confusion, fevers or inability to tolerate oral intake.  Discharge Diagnoses: Principal Problem:   Fracture of femoral neck, right, closed ([place]) Active Problems:   Fall at home, initial encounter   Acute respiratory failure with hypoxia ([place])   Pneumonia of both lungs due to infectious organism   Hypokalemia   Essential hypertension   Generalized anxiety disorder   Hypothyroidism   Elevated cholesterol   Anxiety   Depression   S/P total right hip arthroplasty  Resolved Problems:   * No resolved hospital problems. *   Hospital Course: 82 year old female with past medical history of hypothyroidism, hypertension, anxiety disorder, depression, hyperlipidemia presenting to [facility] emergency room with complaints of right hip pain status post fall at home.  Upon evaluation in the emergency department patient was found to have an acute fracture of the right hip.  Dr. Alvan Dame with orthopedic surgery was consulted and patient underwent total hip arthroplasty on 1/7.  Postoperative course was complicated by hypoxia.  This was thoroughly worked up.  Patient was found to have a slightly elevated BNP however patient did not appear clinically volume overloaded and echocardiogram  revealed a preserved ejection fraction of 65% with grade 1 diastolic dysfunction.  D-dimer was slightly elevated and therefore patient underwent CT angiogram of the chest.  This was negative for pulmonary embolism but did reveal patchy bilateral infiltrates concerning for an infectious process.  Patient was initiated on ceftriaxone and azithromycin for treatment of an early multifocal pneumonia.  After the initiation of antibiotics patient quickly improved with resolution of hypoxia.  Patient was able to be weaned off of the supplemental oxygen.  Any associated encephalopathy also quickly resolved.  Physical therapy has felt the patient would benefit from skilled physical therapy services in a skilled nursing facility.  TOC is currently assisting with this disposition.  Arranges were made for the patient to be discharged to [facility] skilled nursing facility.  Patient was transitioned to oral Omnicef and azithromycin to complete a 5-day course of antibiotics.  Patient was discharged in improved and stable condition on 12/25/2022.      Pain control - [employer] Controlled Substance Reporting System database was reviewed. and patient was instructed, not to drive, operate heavy machinery, perform activities at heights, swimming or participation in water activities or provide baby-sitting services while on Pain, Sleep and Anxiety Medications; until their outpatient Physician has advised to do so again. Also recommended to not to take more than prescribed Pain, Sleep and Anxiety Medications.   Consultants: Dr. Alvan Dame with Orthopedic Surgery Procedures performed: Total hip arthroplasty of the right hip on 1/7 Disposition: Skilled nursing facility Diet recommendation:  Discharge Diet Orders (From admission, onward)     Start     Ordered   12/25/22 0000  Diet - low sodium heart healthy  12/25/22 1313           Regular diet  DISCHARGE MEDICATION: Allergies as of 12/25/2022        Reactions   Nitrofurantoin Nausea Only, Other (See Comments)   Upsets the stomach   Ace Inhibitors Other (See Comments)   Reaction not known by the patient   Codeine Nausea And Vomiting   Cymbalta [duloxetine Hcl] Other (See Comments)   Reaction not known by the patient   Doxycycline Other (See Comments)   Reaction not known by the patient   Gabapentin Nausea Only   Hctz [hydrochlorothiazide] Other (See Comments)   Reaction not known by the patient   Lisinopril Cough   Other Other (See Comments)   Unnamed B/P tablet- made the patient "pass out"   Penicillins Hives   Latex Rash   Sulfa Antibiotics Rash   Tape Rash, Other (See Comments)   Cloth tape preferred- NO latex tape!!        Medication List     STOP taking these medications    amLODipine 5 MG tablet Commonly known as: NORVASC   fluconazole 150 MG tablet Commonly known as: DIFLUCAN       TAKE these medications    acetaminophen 325 MG tablet Commonly known as: TYLENOL Take 1-2 tablets (325-650 mg total) by mouth every 6 (six) hours as needed for mild pain (pain score 1-3 or temp > 100.5).   acetaminophen 325 MG tablet Commonly known as: TYLENOL Take 2 tablets (650 mg total) by mouth 2 (two) times daily.   ascorbic acid 500 MG tablet Commonly known as: VITAMIN C Take 500 mg by mouth in the morning.   aspirin 81 MG chewable tablet Chew 1 tablet (81 mg total) by mouth 2 (two) times daily for 28 days. What changed: You were already taking a medication with the same name, and this prescription was added. Make sure you understand how and when to take each.   aspirin 81 MG tablet Take 1 tablet (81 mg total) by mouth at bedtime. Begin daily aspirin regimen after 28-day course of twice daily aspirin is complete. What changed: additional instructions   azithromycin 500 MG tablet Commonly known as: Zithromax Take 1 tablet (500 mg total) by mouth daily for 4 days. Take 1 tablet daily for 3 days.   Calcium  Citrate+D3 Petites 200-6.25 MG-MCG Tabs Generic drug: Calcium Citrate-Vitamin D Take 2 tablets by mouth daily.   cefdinir 300 MG capsule Commonly known as: OMNICEF Take 1 capsule (300 mg total) by mouth 2 (two) times daily for 4 days.   Claritin 10 MG tablet Generic drug: loratadine Take 10 mg by mouth at bedtime.   Co Q 10 100 MG Caps Take 200 mg by mouth daily.   diazepam 2 MG tablet Commonly known as: VALIUM Take 1 mg by mouth every 6 (six) hours as needed for anxiety.   feeding supplement Liqd Take 237 mLs by mouth 2 (two) times daily between meals.   Fish Oil 1200 MG Caps Take 1,200 mg by mouth daily.   Flax Seeds Powd Take 15 g by mouth daily with breakfast.   HYDROcodone-acetaminophen 5-325 MG tablet Commonly known as: NORCO/VICODIN Take 1 tablet by mouth every 6 (six) hours as needed for up to 5 days for severe pain.   levothyroxine 25 MCG tablet Commonly known as: SYNTHROID Take 25-37.5 mcg by mouth See admin instructions. Take 37.5 mcg by mouth before breakfast on Sun/Sat and 25 mcg on Mon/Tues/Wed/Thurs/Fri   losartan  100 MG tablet Commonly known as: COZAAR Take 100 mg by mouth in the morning.   methocarbamol 500 MG tablet Commonly known as: ROBAXIN Take 1 tablet (500 mg total) by mouth every 6 (six) hours as needed for muscle spasms.   multivitamin tablet Take 1 tablet by mouth daily with breakfast.   ondansetron 4 MG tablet Commonly known as: ZOFRAN Take 1 tablet (4 mg total) by mouth every 6 (six) hours as needed for nausea.   polyethylene glycol 17 g packet Commonly known as: MIRALAX / GLYCOLAX Take 17 g by mouth daily as needed. What changed:  medication strength how much to take when to take this reasons to take this additional instructions   Premarin vaginal cream Generic drug: conjugated estrogens INSERT 1/4 APPLICATOR VAGINALLY 2 TIMES A WEEK AS DIRECTED What changed: See the new instructions.   rosuvastatin 5 MG tablet Commonly  known as: CRESTOR Take 5 mg by mouth at bedtime.   sertraline 100 MG tablet Commonly known as: ZOLOFT Take 200 mg by mouth in the morning.   Simply Saline 0.9 % Aers Generic drug: Saline Place 1 spray into both nostrils 4 (four) times daily.   Vitamin D3 50 MCG (2000 UT) Tabs Take 2,000 Units by mouth every Monday, Wednesday, and Friday.   Vitamin-B Complex Tabs Take 1 tablet by mouth daily with breakfast.        Follow-up Information     Paralee Cancel, MD. Schedule an appointment as soon as possible for a visit in 2 week(s).   Specialty: Orthopedic Surgery Contact information: [address] [place] [address] [phone]         [facility] .   Specialty: [place] information: [address] Test Update Address [place] [phone]                Discharge Exam: Danley Danker Weights   12/19/22 2321  Weight: 56.7 kg   Constitutional: Awake alert and oriented x3, no associated distress.   Respiratory: Mild bibasilar rales noted, no wheezing, no crackles. Normal respiratory effort. No accessory muscle use.  Cardiovascular: Regular rate and rhythm, no murmurs / rubs / gallops. No extremity edema. 2+ pedal pulses. No carotid bruits.  Abdomen: Abdomen is soft and nontender.  No evidence of intra-abdominal masses.  Positive bowel sounds noted in all quadrants.   Musculoskeletal: Pain with both passive and active range of motion of the right hip.    Condition at discharge: fair  The results of significant diagnostics from this hospitalization (including imaging, microbiology, ancillary and laboratory) are listed below for reference.   Imaging Studies: ECHOCARDIOGRAM COMPLETE  Result Date: 12/25/2022    ECHOCARDIOGRAM REPORT   Patient Name:   SHALEE PAOLO Date of Exam: 12/25/2022 Medical Rec #:  [id number]             Height:       62.0 in Accession #:    [id number]             Weight:       125.0 lb Date of Birth:  [birth year]             BSA:          1.566 m Patient Age:    61 years              BP:           142/62 mmHg Patient Gender: F  HR:           85 bpm. Exam Location:  Inpatient Procedure: 2D Echo Indications:    CHF  History:        Patient has no prior history of Echocardiogram examinations.                 Risk Factors:Hypertension.  Sonographer:    Harvie Junior Referring Phys: [id number] Vernelle Emerald  Sonographer Comments: Technically difficult study due to poor echo windows. Image acquisition challenging due to respiratory motion. IMPRESSIONS  1. Left ventricular ejection fraction, by estimation, is 65%. The left ventricle has normal function. Left ventricular endocardial border not optimally defined to evaluate regional wall motion. There is mild left ventricular hypertrophy. Left ventricular diastolic parameters are consistent with Grade I diastolic dysfunction (impaired relaxation).  2. Right ventricular systolic function is normal. The right ventricular size is normal. There is mildly elevated pulmonary artery systolic pressure.  3. The mitral valve is grossly normal. No evidence of mitral valve regurgitation. The mean mitral valve gradient is 2.0 mmHg with average heart rate of 86 bpm. Severe mitral annular calcification.  4. The aortic valve was not well visualized. There is mild calcification of the aortic valve. Aortic valve regurgitation is not visualized. Aortic valve sclerosis is present, with no evidence of aortic valve stenosis.  5. The inferior vena cava is normal in size with greater than 50% respiratory variability, suggesting right atrial pressure of 3 mmHg. Comparison(s): No prior Echocardiogram. FINDINGS  Left Ventricle: Left ventricular ejection fraction, by estimation, is 65%. The left ventricle has normal function. Left ventricular endocardial border not optimally defined to evaluate regional wall motion. The left ventricular  internal cavity size was small. There is mild left ventricular hypertrophy. Left ventricular diastolic parameters are consistent with Grade I diastolic dysfunction (impaired relaxation). Right Ventricle: The right ventricular size is normal. No increase in right ventricular wall thickness. Right ventricular systolic function is normal. There is mildly elevated pulmonary artery systolic pressure. The tricuspid regurgitant velocity is 3.00  m/s, and with an assumed right atrial pressure of 3 mmHg, the estimated right ventricular systolic pressure is 74.1 mmHg. Left Atrium: Left atrial size was normal in size. Right Atrium: Right atrial size was normal in size. Pericardium: There is no evidence of pericardial effusion. Mitral Valve: The mitral valve is grossly normal. Severe mitral annular calcification. No evidence of mitral valve regurgitation. MV peak gradient, 8.6 mmHg. The mean mitral valve gradient is 2.0 mmHg with average heart rate of 86 bpm. Tricuspid Valve: The tricuspid valve is normal in structure. Tricuspid valve regurgitation is mild . No evidence of tricuspid stenosis. Aortic Valve: The aortic valve was not well visualized. There is mild calcification of the aortic valve. There is mild aortic valve annular calcification. Aortic valve regurgitation is not visualized. Aortic valve sclerosis is present, with no evidence of aortic valve stenosis. Aortic valve mean gradient measures 6.0 mmHg. Aortic valve peak gradient measures 13.1 mmHg. Aortic valve area, by VTI measures 2.10 cm. Pulmonic Valve: The pulmonic valve was not well visualized. Pulmonic valve regurgitation is not visualized. No evidence of pulmonic stenosis. Aorta: The aortic root is normal in size and structure. Venous: The inferior vena cava is normal in size with greater than 50% respiratory variability, suggesting right atrial pressure of 3 mmHg. IAS/Shunts: No atrial level shunt detected by color flow Doppler.  LEFT VENTRICLE PLAX 2D LVIDd:          3.15 cm  Diastology LVIDs:         1.80 cm     LV e' medial:    6.53 cm/s LV PW:         1.00 cm     LV E/e' medial:  13.7 LV IVS:        1.00 cm     LV e' lateral:   8.05 cm/s LVOT diam:     1.80 cm     LV E/e' lateral: 11.1 LV SV:         67 LV SV Index:   43 LVOT Area:     2.54 cm  LV Volumes (MOD) LV vol d, MOD A2C: 57.2 ml LV vol d, MOD A4C: 68.9 ml LV vol s, MOD A2C: 19.4 ml LV vol s, MOD A4C: 25.5 ml LV SV MOD A2C:     37.8 ml LV SV MOD A4C:     68.9 ml LV SV MOD BP:      40.0 ml RIGHT VENTRICLE RV Basal diam:  3.10 cm RV Mid diam:    2.40 cm RV S prime:     22.40 cm/s TAPSE (M-mode): 3.0 cm LEFT ATRIUM             Index        RIGHT ATRIUM           Index LA diam:        2.50 cm 1.60 cm/m   RA Area:     11.50 cm LA Vol (A2C):   61.0 ml 38.96 ml/m  RA Volume:   27.20 ml  17.37 ml/m LA Vol (A4C):   36.3 ml 23.19 ml/m LA Biplane Vol: 47.9 ml 30.60 ml/m  AORTIC VALVE                     PULMONIC VALVE AV Area (Vmax):    2.01 cm      PV Vmax:       0.98 m/s AV Area (Vmean):   2.13 cm      PV Peak grad:  3.8 mmHg AV Area (VTI):     2.10 cm AV Vmax:           181.00 cm/s AV Vmean:          113.000 cm/s AV VTI:            0.317 m AV Peak Grad:      13.1 mmHg AV Mean Grad:      6.0 mmHg LVOT Vmax:         143.00 cm/s LVOT Vmean:        94.700 cm/s LVOT VTI:          0.262 m LVOT/AV VTI ratio: 0.83  AORTA Ao Root diam: 2.80 cm MITRAL VALVE                TRICUSPID VALVE MV Area (PHT): 3.65 cm     TR Peak grad:   36.0 mmHg MV Area VTI:   1.52 cm     TR Vmax:        300.00 cm/s MV Peak grad:  8.6 mmHg MV Mean grad:  2.0 mmHg     SHUNTS MV Vmax:       1.47 m/s     Systemic VTI:  0.26 m MV Vmean:      61.3 cm/s    Systemic Diam: 1.80 cm MV Decel Time: 208 msec MR Peak grad: 66.2 mmHg MR Vmax:  406.67 cm/s MV E velocity: 89.20 cm/s MV A velocity: 155.00 cm/s MV E/A ratio:  0.58 Rudean Haskell MD Electronically signed by Rudean Haskell MD Signature Date/Time: 12/25/2022/11:21:05 AM     Final    CT Angio Chest Pulmonary Embolism (PE) W or WO Contrast  Result Date: 12/24/2022 CLINICAL DATA:  Pulmonary embolism suspected. High probability. Hypoxia. EXAM: CT ANGIOGRAPHY CHEST WITH CONTRAST TECHNIQUE: Multidetector CT imaging of the chest was performed using the standard protocol during bolus administration of intravenous contrast. Multiplanar CT image reconstructions and MIPs were obtained to evaluate the vascular anatomy. RADIATION DOSE REDUCTION: This exam was performed according to the departmental dose-optimization program which includes automated exposure control, adjustment of the mA and/or kV according to patient size and/or use of iterative reconstruction technique. CONTRAST:  49m OMNIPAQUE IOHEXOL 350 MG/ML SOLN COMPARISON:  Chest radiography yesterday. FINDINGS: Cardiovascular: Mild cardiac enlargement. Extensive coronary artery calcification and aortic atherosclerotic calcification. Pulmonary arterial opacification is good. There are no pulmonary emboli. Mediastinum/Nodes: No mediastinal or hilar mass or lymphadenopathy. Dilated fluid-filled esophagus is present. Lungs/Pleura: Small bilateral effusions layering dependently with dependent atelectasis. Interstitial prominence suggesting mild interstitial edema. Patchy bilateral pulmonary infiltrates, slightly upper lobe predominant, consistent with widespread bronchopneumonia. This pattern can be seen with viral pneumonia including coronavirus Disease. No lobar consolidation. No sign of underlying mass lesion. Upper Abdomen: Negative Musculoskeletal: Ordinary thoracic degenerative changes. Old appearing superior endplate fracture in the upper lumbar region. Review of the MIP images confirms the above findings. IMPRESSION: 1. No pulmonary emboli. 2. Mild cardiac enlargement. Extensive coronary artery calcification and aortic atherosclerotic calcification. 3. Small bilateral pleural effusions layering dependently with dependent  atelectasis. Interstitial prominence suggesting mild interstitial edema. 4. Patchy bilateral pulmonary infiltrates, slightly upper lobe predominant, consistent with widespread bronchopneumonia. This pattern can be seen with viral pneumonia including coronavirus. 5. Dilated fluid-filled esophagus. 6. Aortic atherosclerosis. Aortic Atherosclerosis (ICD10-I70.0). Electronically Signed   By: MNelson ChimesM.D.   On: 12/24/2022 11:57   DG Chest 2 View  Result Date: 12/23/2022 CLINICAL DATA:  Hospital-acquired pneumonia. EXAM: CHEST - 2 VIEW COMPARISON:  12/20/2022 x-ray and older FINDINGS: Calcified aorta. Underinflation with diffuse interstitial changes again noted. Subtle left lung base opacity. Minimal patchy right midlung opacity as well. Subtle infiltrate is possible. No pneumothorax or edema. Small effusions on the lateral view. Stable cardiopericardial silhouette. Degenerative changes. IMPRESSION: Worsening inflation with tiny effusions. Subtle areas of opacity right mid lung and left lung base. Recommend follow-up Electronically Signed   By: AJill SideM.D.   On: 12/23/2022 13:07   DG HIP UNILAT WITH PELVIS 2-3 VIEWS RIGHT  Result Date: 12/22/2022 CLINICAL DATA:  Status post total right hip arthroplasty. EXAM: DG HIP (WITH OR WITHOUT PELVIS) 2-3V RIGHT COMPARISON:  AP pelvis 12/21/2022 FINDINGS: Redemonstration of recent total right hip arthroplasty. No perihardware lucency is seen to indicate hardware failure or loosening. Expected postoperative subcutaneous air about the proximal right thigh is slightly decreased from prior. Mild left femoroacetabular joint space narrowing and superolateral left acetabular degenerative osteophytes. The bilateral sacroiliac joint spaces are maintained. Mild pubic symphysis joint space narrowing and subchondral sclerosis. IMPRESSION: Status post total right hip arthroplasty without evidence of hardware failure. Electronically Signed   By: RYvonne KendallM.D.   On:  12/22/2022 08:27   DG Pelvis Portable  Result Date: 12/21/2022 CLINICAL DATA:  Status post hip arthroplasty. EXAM: PORTABLE PELVIS 1-2 VIEWS COMPARISON:  Same day hip radiographs FINDINGS: The patient is status post a total hip arthroplasty on  the right. The hardware appears intact and well aligned. Soft tissue gas overlies the operative site. Degenerative changes are seen in the spine and left hip. IMPRESSION: Postoperative appearance of total hip arthroplasty on the right. Electronically Signed   By: Zerita Boers M.D.   On: 12/21/2022 17:48   DG HIP UNILAT WITH PELVIS 1V RIGHT  Result Date: 12/21/2022 CLINICAL DATA:  Right hip replacement. EXAM: DG HIP (WITH OR WITHOUT PELVIS) 1V RIGHT COMPARISON:  12/20/2022 FINDINGS: 2 intraoperative spot fluoro images obtained in AP projection of the right hip. Status post arthroplasty. No evidence for immediate hardware complication. IMPRESSION: Status post right hip arthroplasty. No evidence for immediate hardware complication. Electronically Signed   By: Misty Stanley M.D.   On: 12/21/2022 11:36   DG C-Arm 1-60 Min-No Report  Result Date: 12/21/2022 Fluoroscopy was utilized by the requesting physician.  No radiographic interpretation.   DG Chest Port 1 View  Result Date: 12/20/2022 CLINICAL DATA:  Fall.  Preoperative exam. EXAM: PORTABLE CHEST 1 VIEW COMPARISON:  07/26/2019. FINDINGS: The heart size and mediastinal contours are within normal limits. There is atherosclerotic calcification of the aorta. No consolidation, effusion, or pneumothorax. No acute osseous abnormality. IMPRESSION: No active disease. Electronically Signed   By: Brett Fairy M.D.   On: 12/20/2022 01:41   DG Hip Unilat  With Pelvis 2-3 Views Right  Result Date: 12/20/2022 CLINICAL DATA:  Status post fall. EXAM: DG HIP (WITH OR WITHOUT PELVIS) 2-3V RIGHT COMPARISON:  None Available. FINDINGS: An acute fracture deformity is seen extending through the neck of the proximal right femur. There  is no evidence of dislocation. Moderate severity degenerative changes are seen in the form of joint space narrowing and acetabular sclerosis. IMPRESSION: Acute fracture of the proximal right femur. Electronically Signed   By: Virgina Norfolk M.D.   On: 12/20/2022 00:10   DG Wrist Complete Right  Result Date: 12/20/2022 CLINICAL DATA:  Status post fall. EXAM: RIGHT WRIST - COMPLETE 3+ VIEW COMPARISON:  None Available. FINDINGS: There is no evidence of an acute fracture or dislocation. Marked severity degenerative changes are seen involving the first right carpometacarpal articulation. Soft tissues are unremarkable. IMPRESSION: Degenerative changes without evidence of an acute osseous abnormality. Electronically Signed   By: Virgina Norfolk M.D.   On: 12/20/2022 00:07   DG Elbow Complete Right  Result Date: 12/20/2022 CLINICAL DATA:  Status post fall. EXAM: RIGHT ELBOW - COMPLETE 3+ VIEW COMPARISON:  None Available. FINDINGS: There is no evidence of fracture, dislocation, or joint effusion. There is no evidence of arthropathy or other focal bone abnormality. Soft tissues are unremarkable. IMPRESSION: Negative. Electronically Signed   By: Virgina Norfolk M.D.   On: 12/20/2022 00:06    Microbiology: Results for orders placed or performed during the hospital encounter of 12/20/22  Culture, blood (Routine X 2) w Reflex to ID Panel     Status: None (Preliminary result)   Collection Time: 12/24/22  1:10 PM   Specimen: BLOOD RIGHT HAND  Result Value Ref Range Status   Specimen Description   Final    BLOOD RIGHT HAND Performed at [facility] [address], [place], [place] [address]    Special Requests   Final    BOTTLES DRAWN AEROBIC ONLY Blood Culture adequate volume Performed at [facility] [address], [place],  [address]    Culture   Final    NO GROWTH < 24 HOURS Performed at [facility], [address] [place], [place] [address]     Report Status PENDING  Incomplete  Culture, blood (Routine X 2) w Reflex to ID Panel     Status: None (Preliminary result)   Collection Time: 12/24/22  1:10 PM   Specimen: BLOOD RIGHT ARM  Result Value Ref Range Status   Specimen Description   Final    BLOOD RIGHT ARM Performed at [facility] [address] [place], [place] [address]    Special Requests   Final    BOTTLES DRAWN AEROBIC ONLY Blood Culture adequate volume Performed at [facility] [address], [place], [place] [address]    Culture   Final    NO GROWTH < 24 HOURS Performed at [facility] [address] [place], [place] [address]    Report Status PENDING  Incomplete  Resp panel by RT-PCR (RSV, Flu A&B, Covid) Anterior Nasal Swab     Status: None   Collection Time: 12/24/22  1:40 PM   Specimen: Anterior Nasal Swab  Result Value Ref Range Status   SARS Coronavirus 2 by RT PCR NEGATIVE NEGATIVE Final    Comment: (NOTE) SARS-CoV-2 target nucleic acids are NOT DETECTED.  The SARS-CoV-2 RNA is generally detectable in upper respiratory specimens during the acute phase of infection. The lowest concentration of SARS-CoV-2 viral copies this assay can detect is 138 copies/mL. A negative result does not preclude SARS-Cov-2 infection and should not be used as the sole basis for treatment or other patient management decisions. A negative result may occur with  improper specimen collection/handling, submission of specimen other than nasopharyngeal swab, presence of viral mutation(s) within the areas targeted by this assay, and inadequate number of viral copies(<138 copies/mL). A negative result must be combined with clinical observations, patient history, and epidemiological information. The expected result is Negative.  Fact Sheet for Patients:  EntrepreneurPulse.com.au  Fact Sheet for Healthcare Providers:   IncredibleEmployment.be  This test is no t yet approved or cleared by the [place] FDA and  has been authorized for detection and/or diagnosis of SARS-CoV-2 by FDA under an Emergency Use Authorization (EUA). This EUA will remain  in effect (meaning this test can be used) for the duration of the COVID-19 declaration under Section 564(b)(1) of the Act, 21 U.S.C.section 360bbb-3(b)(1), unless the authorization is terminated  or revoked sooner.       Influenza A by PCR NEGATIVE NEGATIVE Final   Influenza B by PCR NEGATIVE NEGATIVE Final    Comment: (NOTE) The Xpert Xpress SARS-CoV-2/FLU/RSV plus assay is intended as an aid in the diagnosis of influenza from Nasopharyngeal swab specimens and should not be used as a sole basis for treatment. Nasal washings and aspirates are unacceptable for Xpert Xpress SARS-CoV-2/FLU/RSV testing.  Fact Sheet for Patients: EntrepreneurPulse.com.au  Fact Sheet for Healthcare Providers: IncredibleEmployment.be  This test is not yet approved or cleared by the [place] FDA and has been authorized for detection and/or diagnosis of SARS-CoV-2 by FDA under an Emergency Use Authorization (EUA). This EUA will remain in effect (meaning this test can be used) for the duration of the COVID-19 declaration under Section 564(b)(1) of the Act, 21 U.S.C. section 360bbb-3(b)(1), unless the authorization is terminated or revoked.     Resp Syncytial Virus by PCR NEGATIVE NEGATIVE Final    Comment: (NOTE) Fact Sheet for Patients: EntrepreneurPulse.com.au  Fact Sheet for Healthcare Providers: IncredibleEmployment.be  This test is not yet approved or cleared by the [place] and  has been authorized for detection and/or diagnosis of SARS-CoV-2 by FDA under an Emergency Use Authorization (EUA). This EUA will remain in effect (meaning this test can be used) for  the duration of the COVID-19 declaration under Section 564(b)(1) of the Act, 21 U.S.C. section 360bbb-3(b)(1), unless the authorization is terminated or revoked.  Performed at [facility], [place] [address], [place], [place] [address]   Respiratory (~20 pathogens) panel by PCR     Status: None   Collection Time: 12/24/22  1:40 PM   Specimen: Nasopharyngeal Swab; Respiratory  Result Value Ref Range Status   Adenovirus NOT DETECTED NOT DETECTED Final   Coronavirus 229E NOT DETECTED NOT DETECTED Final    Comment: (NOTE) The Coronavirus on the Respiratory Panel, DOES NOT test for the novel  Coronavirus (2019 nCoV)    Coronavirus HKU1 NOT DETECTED NOT DETECTED Final   Coronavirus NL63 NOT DETECTED NOT DETECTED Final   Coronavirus OC43 NOT DETECTED NOT DETECTED Final   Metapneumovirus NOT DETECTED NOT DETECTED Final   Rhinovirus / Enterovirus NOT DETECTED NOT DETECTED Final   Influenza A NOT DETECTED NOT DETECTED Final   Influenza B NOT DETECTED NOT DETECTED Final   Parainfluenza Virus 1 NOT DETECTED NOT DETECTED Final   Parainfluenza Virus 2 NOT DETECTED NOT DETECTED Final   Parainfluenza Virus 3 NOT DETECTED NOT DETECTED Final   Parainfluenza Virus 4 NOT DETECTED NOT DETECTED Final   Respiratory Syncytial Virus NOT DETECTED NOT DETECTED Final   Bordetella pertussis NOT DETECTED NOT DETECTED Final   Bordetella Parapertussis NOT DETECTED NOT DETECTED Final   Chlamydophila pneumoniae NOT DETECTED NOT DETECTED Final   Mycoplasma pneumoniae NOT DETECTED NOT DETECTED Final    Comment: Performed at [facility], [place]. [address], [place], [place] [address]    Labs: CBC: Recent Labs  Lab 12/20/22 0045 12/22/22 0322 12/23/22 0348 12/24/22 0347 12/25/22 0342  WBC 13.6* 9.0 10.9* 8.2 7.9  NEUTROABS 12.0*  --   --  6.1 5.9  HGB 12.7 9.9* 10.0* 10.0* 9.6*  HCT 36.7 29.2* 28.9* 29.8* 28.5*  MCV 96.8 99.3 97.6 99.0 98.6  PLT 176 118* 136* 145* 295   Basic  Metabolic Panel: Recent Labs  Lab 12/20/22 0045 12/22/22 0322 12/23/22 0348 12/24/22 0347 12/25/22 0342  NA 134* 134* 134* 136 140  K 4.8 3.5 3.1* 3.3* 4.0  CL 95* 99 96* 98 103  CO2 '26 30 30 31 29  '$ GLUCOSE 122* 147* 132* 127* 121*  BUN 27* '9 12 20 15  '$ CREATININE 0.95 0.65 0.61 0.66 0.62  CALCIUM 9.8 7.8* 8.0* 8.0* 8.1*  MG  --   --   --   --  2.1   Liver Function Tests: Recent Labs  Lab 12/25/22 0342  AST 37  ALT 37  ALKPHOS 53  BILITOT 0.7  PROT 5.9*  ALBUMIN 2.7*   CBG: No results for input(s): "GLUCAP" in the last 168 hours.  Discharge time spent: greater than 30 minutes.  Signed: Vernelle Emerald, MD [facility] 12/25/2022

## 2022-12-25 NOTE — Plan of Care (Signed)
Plan of care reviewed and discussed. °

## 2022-12-25 NOTE — Progress Notes (Signed)
  Echocardiogram 2D Echocardiogram has been performed.  Brittany Douglas 12/25/2022, 11:04 AM

## 2022-12-25 NOTE — Progress Notes (Signed)
Physical Therapy Treatment Patient Details Name: Brittany Douglas MRN: [id number] DOB: [birth year] Today's Date: 12/25/2022   History of Present Illness Pt is an 82 year old female admitted after fall at home and sustained displaced right hip femoral neck fracture.  Pt s/p R THA on 12/21/22.  PHMx: osteopenia, osteoporosis, DDD, DJD, depression, balance problems.    PT Comments    The patient reports feeling much better. Happy to be off O2.  Patient 96% SPO2  resting on RA.  Patient requesting  to go to toilet. Upon standing and attempting to ambulate, determined ambulation to Br was a Doctor, general practice. Assisted patient to  stand and transfer to [facility] then able to step to recliner.  SPO2 93%.   Recommendations for follow up therapy are one component of a multi-disciplinary discharge planning process, led by the attending physician.  Recommendations may be updated based on patient status, additional functional criteria and insurance authorization.  Follow Up Recommendations  Skilled nursing-short term rehab (<3 hours/day) Can patient physically be transported by private vehicle: No   Assistance Recommended at Discharge Frequent or constant Supervision/Assistance  Patient can return home with the following A little help with walking and/or transfers;A little help with bathing/dressing/bathroom;Assistance with cooking/housework;Help with stairs or ramp for entrance;Assist for transportation   Equipment Recommendations  Rolling walker (2 wheels)    Recommendations for Other Services       Precautions / Restrictions Precautions Precautions: Fall Restrictions Weight Bearing Restrictions: No Other Position/Activity Restrictions: WBAT     Mobility  Bed Mobility   Bed Mobility: Supine to Sit     Supine to sit: Min assist     General bed mobility comments: assist for Rt LE, increased time and effort    Transfers Overall transfer level: Needs assistance Equipment used: Rolling walker  (2 wheels) Transfers: Sit to/from Stand, Bed to chair/wheelchair/BSC Sit to Stand: Min assist   Step pivot transfers: Min assist       General transfer comment: verbal cues for UE and LE positioning, assist to rise and control descent,  step to [facility], then 180* turn  with Rw from [facility] to recliner.    Ambulation/Gait               General Gait Details: urgency for  toileting so  not able to progress  ambulation   Stairs             Wheelchair Mobility    Modified Rankin (Stroke Patients Only)       Balance Overall balance assessment: History of Falls, Needs assistance Sitting-balance support: Feet supported, Bilateral upper extremity supported Sitting balance-Leahy Scale: Fair     Standing balance support: During functional activity, Bilateral upper extremity supported, Reliant on assistive device for balance Standing balance-Leahy Scale: Poor Standing balance comment: stands and balances with Rw  for pericare.                            Cognition Arousal/Alertness: Awake/alert Behavior During Therapy: WFL for tasks assessed/performed Overall Cognitive Status: Within Functional Limits for tasks assessed                                          Exercises      General Comments        Pertinent Vitals/Pain Pain Assessment Pain Assessment: Faces Faces Pain Scale: Hurts  little more Pain Location: right hip Pain Descriptors / Indicators: Sore, Guarding, Aching Pain Intervention(s): Monitored during session, Premedicated before session    Home Living                          Prior Function            PT Goals (current goals can now be found in the care plan section) Progress towards PT goals: Progressing toward goals    Frequency    Min 3X/week      PT Plan Current plan remains appropriate    Co-evaluation              AM-PAC PT "6 Clicks" Mobility   Outcome Measure  Help needed turning  from your back to your side while in a flat bed without using bedrails?: A Little Help needed moving from lying on your back to sitting on the side of a flat bed without using bedrails?: A Little Help needed moving to and from a bed to a chair (including a wheelchair)?: A Little Help needed standing up from a chair using your arms (e.g., wheelchair or bedside chair)?: A Little Help needed to walk in hospital room?: A Lot Help needed climbing 3-5 steps with a railing? : Total 6 Click Score: 15    End of Session Equipment Utilized During Treatment: Gait belt Activity Tolerance: Patient tolerated treatment well Patient left: in chair;with call bell/phone within reach;with family/visitor present Nurse Communication: Mobility status PT Visit Diagnosis: Difficulty in walking, not elsewhere classified (R26.2);Pain Pain - Right/Left: Right Pain - part of body: Hip     Time: 1153-1220 PT Time Calculation (min) (ACUTE ONLY): 27 min  Charges:  $Therapeutic Activity: 8-22 mins $Self Care/Home Management: [place] Office [phone] Weekend [phone]    Claretha Cooper 12/25/2022, 12:36 PM

## 2022-12-25 NOTE — TOC Transition Note (Signed)
Transition of Care [facility]) - CM/SW Discharge Note  Patient Details  Name: Brittany Douglas MRN: [id number] Date of Birth: 1941-02-13  [facility] CM/SW Contact:  Sherie Don, LCSW Phone Number: 12/25/2022, 2:10 PM  Clinical Narrative: Patient is medically ready to discharge today. Patient will go to room 503A and the number for report is [phone]. Discharge summary, discharge orders, and SNF transfer report faxed to facility in hub. Medical necessity form done; PTAR scheduled. Discharge packet completed. CSW notified daughter of discharge and that transportation is set up. RN updated. TOC signing off.    Final next level of care: Skilled Nursing Facility Barriers to Discharge: Barriers Resolved  Patient Goals and CMS Choice CMS Medicare.gov Compare Post Acute Care list provided to:: Patient Represenative (must comment) Choice offered to / list presented to : Adult Children, Patient  Discharge Placement PASRR number recieved: 12/22/22   Patient chooses bed at: [facility] Patient to be transferred to facility by: [place] Name of family member notified: [name] (daughter) Patient and family notified of of transfer: 12/25/22  Discharge Plan and Services Additional resources added to the After Visit Summary for   In-house Referral: Clinical Social Work Post Acute Care Choice: [place]          DME Arranged: N/A DME Agency: NA  Social Determinants of Health ([place]) Interventions [place]: Low Risk  (12/20/2022)  Tobacco Use: Low Risk  (12/23/2022)   Readmission Risk Interventions    12/22/2022    3:33 PM  Readmission Risk Prevention Plan  Post Dischage Appt Complete  Medication Screening Complete  Transportation Screening Complete

## 2022-12-29 LAB — CULTURE, BLOOD (ROUTINE X 2)
Culture: NO GROWTH
Culture: NO GROWTH
Special Requests: ADEQUATE
Special Requests: ADEQUATE

## 2022-12-29 LAB — VITAMIN D 25 HYDROXY (VIT D DEFICIENCY, FRACTURES)

## 2022-12-30 DIAGNOSIS — S71001A Unspecified open wound, right hip, initial encounter: Secondary | ICD-10-CM | POA: Diagnosis not present

## 2022-12-30 DIAGNOSIS — L22 Diaper dermatitis: Secondary | ICD-10-CM | POA: Diagnosis not present

## 2022-12-30 DIAGNOSIS — E039 Hypothyroidism, unspecified: Secondary | ICD-10-CM | POA: Diagnosis not present

## 2022-12-30 DIAGNOSIS — E7800 Pure hypercholesterolemia, unspecified: Secondary | ICD-10-CM | POA: Diagnosis not present

## 2023-01-01 DIAGNOSIS — I10 Essential (primary) hypertension: Secondary | ICD-10-CM | POA: Diagnosis not present

## 2023-01-01 DIAGNOSIS — S72001D Fracture of unspecified part of neck of right femur, subsequent encounter for closed fracture with routine healing: Secondary | ICD-10-CM | POA: Diagnosis not present

## 2023-01-01 DIAGNOSIS — M25551 Pain in right hip: Secondary | ICD-10-CM | POA: Diagnosis not present

## 2023-01-01 DIAGNOSIS — E785 Hyperlipidemia, unspecified: Secondary | ICD-10-CM | POA: Diagnosis not present

## 2023-01-02 DIAGNOSIS — R051 Acute cough: Secondary | ICD-10-CM

## 2023-01-02 DIAGNOSIS — M25551 Pain in right hip: Secondary | ICD-10-CM

## 2023-01-02 DIAGNOSIS — R0602 Shortness of breath: Secondary | ICD-10-CM

## 2023-01-03 ENCOUNTER — Inpatient Hospital Stay (HOSPITAL_COMMUNITY): Payer: PPO

## 2023-01-03 ENCOUNTER — Emergency Department (HOSPITAL_COMMUNITY): Payer: PPO

## 2023-01-03 ENCOUNTER — Encounter (HOSPITAL_COMMUNITY): Payer: Self-pay | Admitting: Internal Medicine

## 2023-01-03 ENCOUNTER — Inpatient Hospital Stay (HOSPITAL_COMMUNITY)
Admission: EM | Admit: 2023-01-03 | Discharge: 2023-01-15 | DRG: 193 | Disposition: E | Payer: PPO | Source: Skilled Nursing Facility | Attending: Internal Medicine | Admitting: Internal Medicine

## 2023-01-03 DIAGNOSIS — E559 Vitamin D deficiency, unspecified: Secondary | ICD-10-CM | POA: Diagnosis present

## 2023-01-03 DIAGNOSIS — J80 Acute respiratory distress syndrome: Secondary | ICD-10-CM | POA: Diagnosis present

## 2023-01-03 DIAGNOSIS — Z803 Family history of malignant neoplasm of breast: Secondary | ICD-10-CM

## 2023-01-03 DIAGNOSIS — E038 Other specified hypothyroidism: Secondary | ICD-10-CM | POA: Diagnosis present

## 2023-01-03 DIAGNOSIS — F419 Anxiety disorder, unspecified: Secondary | ICD-10-CM | POA: Diagnosis not present

## 2023-01-03 DIAGNOSIS — R000 Tachycardia, unspecified: Secondary | ICD-10-CM | POA: Diagnosis not present

## 2023-01-03 DIAGNOSIS — Z8049 Family history of malignant neoplasm of other genital organs: Secondary | ICD-10-CM

## 2023-01-03 DIAGNOSIS — Z880 Allergy status to penicillin: Secondary | ICD-10-CM

## 2023-01-03 DIAGNOSIS — Y95 Nosocomial condition: Secondary | ICD-10-CM | POA: Diagnosis present

## 2023-01-03 DIAGNOSIS — Z9049 Acquired absence of other specified parts of digestive tract: Secondary | ICD-10-CM

## 2023-01-03 DIAGNOSIS — I6523 Occlusion and stenosis of bilateral carotid arteries: Secondary | ICD-10-CM | POA: Diagnosis not present

## 2023-01-03 DIAGNOSIS — I160 Hypertensive urgency: Secondary | ICD-10-CM | POA: Diagnosis present

## 2023-01-03 DIAGNOSIS — Z66 Do not resuscitate: Secondary | ICD-10-CM | POA: Diagnosis not present

## 2023-01-03 DIAGNOSIS — R739 Hyperglycemia, unspecified: Secondary | ICD-10-CM | POA: Diagnosis present

## 2023-01-03 DIAGNOSIS — Z83438 Family history of other disorder of lipoprotein metabolism and other lipidemia: Secondary | ICD-10-CM

## 2023-01-03 DIAGNOSIS — I130 Hypertensive heart and chronic kidney disease with heart failure and stage 1 through stage 4 chronic kidney disease, or unspecified chronic kidney disease: Secondary | ICD-10-CM | POA: Diagnosis present

## 2023-01-03 DIAGNOSIS — R0602 Shortness of breath: Secondary | ICD-10-CM | POA: Diagnosis not present

## 2023-01-03 DIAGNOSIS — E039 Hypothyroidism, unspecified: Secondary | ICD-10-CM | POA: Diagnosis not present

## 2023-01-03 DIAGNOSIS — Z793 Long term (current) use of hormonal contraceptives: Secondary | ICD-10-CM

## 2023-01-03 DIAGNOSIS — Z823 Family history of stroke: Secondary | ICD-10-CM

## 2023-01-03 DIAGNOSIS — Z8701 Personal history of pneumonia (recurrent): Secondary | ICD-10-CM

## 2023-01-03 DIAGNOSIS — I4719 Other supraventricular tachycardia: Secondary | ICD-10-CM | POA: Diagnosis not present

## 2023-01-03 DIAGNOSIS — A419 Sepsis, unspecified organism: Secondary | ICD-10-CM | POA: Insufficient documentation

## 2023-01-03 DIAGNOSIS — J180 Bronchopneumonia, unspecified organism: Principal | ICD-10-CM | POA: Diagnosis present

## 2023-01-03 DIAGNOSIS — G9341 Metabolic encephalopathy: Secondary | ICD-10-CM | POA: Diagnosis not present

## 2023-01-03 DIAGNOSIS — Z83719 Family history of colon polyps, unspecified: Secondary | ICD-10-CM

## 2023-01-03 DIAGNOSIS — N182 Chronic kidney disease, stage 2 (mild): Secondary | ICD-10-CM | POA: Diagnosis not present

## 2023-01-03 DIAGNOSIS — Z515 Encounter for palliative care: Secondary | ICD-10-CM | POA: Diagnosis not present

## 2023-01-03 DIAGNOSIS — R54 Age-related physical debility: Secondary | ICD-10-CM | POA: Diagnosis present

## 2023-01-03 DIAGNOSIS — J9601 Acute respiratory failure with hypoxia: Secondary | ICD-10-CM | POA: Diagnosis present

## 2023-01-03 DIAGNOSIS — M810 Age-related osteoporosis without current pathological fracture: Secondary | ICD-10-CM | POA: Diagnosis present

## 2023-01-03 DIAGNOSIS — Z7189 Other specified counseling: Secondary | ICD-10-CM | POA: Diagnosis not present

## 2023-01-03 DIAGNOSIS — D631 Anemia in chronic kidney disease: Secondary | ICD-10-CM | POA: Diagnosis not present

## 2023-01-03 DIAGNOSIS — E7800 Pure hypercholesterolemia, unspecified: Secondary | ICD-10-CM | POA: Diagnosis present

## 2023-01-03 DIAGNOSIS — F32A Depression, unspecified: Secondary | ICD-10-CM | POA: Diagnosis present

## 2023-01-03 DIAGNOSIS — I2510 Atherosclerotic heart disease of native coronary artery without angina pectoris: Secondary | ICD-10-CM | POA: Diagnosis present

## 2023-01-03 DIAGNOSIS — Z882 Allergy status to sulfonamides status: Secondary | ICD-10-CM

## 2023-01-03 DIAGNOSIS — I5033 Acute on chronic diastolic (congestive) heart failure: Secondary | ICD-10-CM | POA: Diagnosis present

## 2023-01-03 DIAGNOSIS — J189 Pneumonia, unspecified organism: Secondary | ICD-10-CM | POA: Diagnosis not present

## 2023-01-03 DIAGNOSIS — Z96641 Presence of right artificial hip joint: Secondary | ICD-10-CM | POA: Diagnosis present

## 2023-01-03 DIAGNOSIS — J90 Pleural effusion, not elsewhere classified: Secondary | ICD-10-CM | POA: Diagnosis not present

## 2023-01-03 DIAGNOSIS — R918 Other nonspecific abnormal finding of lung field: Secondary | ICD-10-CM | POA: Diagnosis not present

## 2023-01-03 DIAGNOSIS — Z8349 Family history of other endocrine, nutritional and metabolic diseases: Secondary | ICD-10-CM

## 2023-01-03 DIAGNOSIS — M5137 Other intervertebral disc degeneration, lumbosacral region: Secondary | ICD-10-CM | POA: Diagnosis present

## 2023-01-03 DIAGNOSIS — Z90710 Acquired absence of both cervix and uterus: Secondary | ICD-10-CM

## 2023-01-03 DIAGNOSIS — I10 Essential (primary) hypertension: Secondary | ICD-10-CM | POA: Diagnosis present

## 2023-01-03 DIAGNOSIS — Z7982 Long term (current) use of aspirin: Secondary | ICD-10-CM

## 2023-01-03 DIAGNOSIS — Z1152 Encounter for screening for COVID-19: Secondary | ICD-10-CM

## 2023-01-03 DIAGNOSIS — Z8249 Family history of ischemic heart disease and other diseases of the circulatory system: Secondary | ICD-10-CM

## 2023-01-03 DIAGNOSIS — Z885 Allergy status to narcotic agent status: Secondary | ICD-10-CM

## 2023-01-03 DIAGNOSIS — Z79899 Other long term (current) drug therapy: Secondary | ICD-10-CM

## 2023-01-03 DIAGNOSIS — K219 Gastro-esophageal reflux disease without esophagitis: Secondary | ICD-10-CM | POA: Diagnosis not present

## 2023-01-03 DIAGNOSIS — N179 Acute kidney failure, unspecified: Secondary | ICD-10-CM | POA: Diagnosis not present

## 2023-01-03 DIAGNOSIS — B957 Other staphylococcus as the cause of diseases classified elsewhere: Secondary | ICD-10-CM | POA: Diagnosis present

## 2023-01-03 DIAGNOSIS — Z833 Family history of diabetes mellitus: Secondary | ICD-10-CM

## 2023-01-03 DIAGNOSIS — R4701 Aphasia: Secondary | ICD-10-CM | POA: Diagnosis not present

## 2023-01-03 DIAGNOSIS — I480 Paroxysmal atrial fibrillation: Secondary | ICD-10-CM | POA: Diagnosis not present

## 2023-01-03 DIAGNOSIS — R8271 Bacteriuria: Secondary | ICD-10-CM | POA: Diagnosis present

## 2023-01-03 DIAGNOSIS — D649 Anemia, unspecified: Secondary | ICD-10-CM | POA: Insufficient documentation

## 2023-01-03 DIAGNOSIS — R9431 Abnormal electrocardiogram [ECG] [EKG]: Secondary | ICD-10-CM | POA: Diagnosis not present

## 2023-01-03 DIAGNOSIS — I700 Atherosclerosis of aorta: Secondary | ICD-10-CM | POA: Diagnosis present

## 2023-01-03 DIAGNOSIS — R0689 Other abnormalities of breathing: Secondary | ICD-10-CM | POA: Diagnosis not present

## 2023-01-03 DIAGNOSIS — R0902 Hypoxemia: Secondary | ICD-10-CM | POA: Diagnosis not present

## 2023-01-03 DIAGNOSIS — R29818 Other symptoms and signs involving the nervous system: Secondary | ICD-10-CM | POA: Diagnosis not present

## 2023-01-03 DIAGNOSIS — Z888 Allergy status to other drugs, medicaments and biological substances status: Secondary | ICD-10-CM

## 2023-01-03 DIAGNOSIS — I255 Ischemic cardiomyopathy: Secondary | ICD-10-CM | POA: Diagnosis not present

## 2023-01-03 DIAGNOSIS — Z91040 Latex allergy status: Secondary | ICD-10-CM

## 2023-01-03 LAB — URINALYSIS, ROUTINE W REFLEX MICROSCOPIC
Bacteria, UA: NONE SEEN
Bilirubin Urine: NEGATIVE
Glucose, UA: NEGATIVE mg/dL
Hgb urine dipstick: NEGATIVE
Ketones, ur: 5 mg/dL — AB
Leukocytes,Ua: NEGATIVE
Nitrite: NEGATIVE
Protein, ur: 30 mg/dL — AB
Specific Gravity, Urine: 1.017 (ref 1.005–1.030)
pH: 6 (ref 5.0–8.0)

## 2023-01-03 LAB — APTT: aPTT: 31 seconds (ref 24–36)

## 2023-01-03 LAB — CBC WITH DIFFERENTIAL/PLATELET
Abs Immature Granulocytes: 0.23 10*3/uL — ABNORMAL HIGH (ref 0.00–0.07)
Basophils Absolute: 0.1 10*3/uL (ref 0.0–0.1)
Basophils Relative: 0 %
Eosinophils Absolute: 0.1 10*3/uL (ref 0.0–0.5)
Eosinophils Relative: 0 %
HCT: 27.5 % — ABNORMAL LOW (ref 36.0–46.0)
Hemoglobin: 9.2 g/dL — ABNORMAL LOW (ref 12.0–15.0)
Immature Granulocytes: 1 %
Lymphocytes Relative: 2 %
Lymphs Abs: 0.4 10*3/uL — ABNORMAL LOW (ref 0.7–4.0)
MCH: 33.1 pg (ref 26.0–34.0)
MCHC: 33.5 g/dL (ref 30.0–36.0)
MCV: 98.9 fL (ref 80.0–100.0)
Monocytes Absolute: 1 10*3/uL (ref 0.1–1.0)
Monocytes Relative: 4 %
Neutro Abs: 21.2 10*3/uL — ABNORMAL HIGH (ref 1.7–7.7)
Neutrophils Relative %: 93 %
Platelets: 593 10*3/uL — ABNORMAL HIGH (ref 150–400)
RBC: 2.78 MIL/uL — ABNORMAL LOW (ref 3.87–5.11)
RDW: 13.2 % (ref 11.5–15.5)
WBC: 23 10*3/uL — ABNORMAL HIGH (ref 4.0–10.5)
nRBC: 0 % (ref 0.0–0.2)

## 2023-01-03 LAB — COMPREHENSIVE METABOLIC PANEL
ALT: 24 U/L (ref 0–44)
AST: 29 U/L (ref 15–41)
Albumin: 2.7 g/dL — ABNORMAL LOW (ref 3.5–5.0)
Alkaline Phosphatase: 90 U/L (ref 38–126)
Anion gap: 11 (ref 5–15)
BUN: 9 mg/dL (ref 8–23)
CO2: 26 mmol/L (ref 22–32)
Calcium: 8.3 mg/dL — ABNORMAL LOW (ref 8.9–10.3)
Chloride: 97 mmol/L — ABNORMAL LOW (ref 98–111)
Creatinine, Ser: 0.52 mg/dL (ref 0.44–1.00)
GFR, Estimated: >60 mL/min (ref >60)
Glucose, Bld: 163 mg/dL — ABNORMAL HIGH (ref 70–99)
Potassium: 3.7 mmol/L (ref 3.5–5.1)
Sodium: 134 mmol/L — ABNORMAL LOW (ref 135–145)
Total Bilirubin: 0.5 mg/dL (ref 0.3–1.2)
Total Protein: 6.7 g/dL (ref 6.5–8.1)

## 2023-01-03 LAB — BLOOD GAS, VENOUS
Acid-Base Excess: 2.7 mmol/L — ABNORMAL HIGH (ref 0.0–2.0)
Bicarbonate: 28.9 mmol/L — ABNORMAL HIGH (ref 20.0–28.0)
O2 Saturation: 87.1 %
Patient temperature: 37
pCO2, Ven: 50 mmHg (ref 44–60)
pH, Ven: 7.37 (ref 7.25–7.43)
pO2, Ven: 54 mmHg — ABNORMAL HIGH (ref 32–45)

## 2023-01-03 LAB — BRAIN NATRIURETIC PEPTIDE: B Natriuretic Peptide: 752.5 pg/mL — ABNORMAL HIGH (ref 0.0–100.0)

## 2023-01-03 LAB — RESP PANEL BY RT-PCR (RSV, FLU A&B, COVID)  RVPGX2
Influenza A by PCR: NEGATIVE
Influenza B by PCR: NEGATIVE
Resp Syncytial Virus by PCR: NEGATIVE
SARS Coronavirus 2 by RT PCR: NEGATIVE

## 2023-01-03 LAB — PROTIME-INR
INR: 1.3 — ABNORMAL HIGH (ref 0.8–1.2)
Prothrombin Time: 15.9 seconds — ABNORMAL HIGH (ref 11.4–15.2)

## 2023-01-03 LAB — HEMOGLOBIN A1C
Hgb A1c MFr Bld: 5.7 % — ABNORMAL HIGH (ref 4.8–5.6)
Mean Plasma Glucose: 116.89 mg/dL

## 2023-01-03 LAB — LACTIC ACID, PLASMA
Lactic Acid, Venous: 1.3 mmol/L (ref 0.5–1.9)
Lactic Acid, Venous: 1.1 mmol/L (ref 0.5–1.9)

## 2023-01-03 LAB — D-DIMER, QUANTITATIVE: D-Dimer, Quant: 2.91 ug/mL-FEU — ABNORMAL HIGH (ref 0.00–0.50)

## 2023-01-03 LAB — MRSA NEXT GEN BY PCR, NASAL: MRSA by PCR Next Gen: NOT DETECTED

## 2023-01-03 MED ORDER — LACTATED RINGERS IV SOLN: INTRAVENOUS | Status: DC

## 2023-01-03 MED ORDER — SODIUM CHLORIDE 0.9 % IV SOLN
500.0000 mg | INTRAVENOUS | Status: DC
  Administered 2023-01-03: 500 mg via INTRAVENOUS
  Filled 2023-01-03: qty 5

## 2023-01-03 MED ORDER — ONDANSETRON HCL 4 MG/2ML IJ SOLN
4.0000 mg | Freq: Four times a day (QID) | INTRAMUSCULAR | Status: DC | PRN
  Administered 2023-01-05 – 2023-01-07 (×3): 4 mg via INTRAVENOUS
  Filled 2023-01-03 (×3): qty 2

## 2023-01-03 MED ORDER — LACTATED RINGERS IV BOLUS (SEPSIS)
250.0000 mL | Freq: Once | INTRAVENOUS | Status: AC
  Administered 2023-01-03: 250 mL via INTRAVENOUS

## 2023-01-03 MED ORDER — KETOROLAC TROMETHAMINE 15 MG/ML IJ SOLN
15.0000 mg | Freq: Once | INTRAMUSCULAR | Status: AC
  Administered 2023-01-03: 15 mg via INTRAVENOUS
  Filled 2023-01-03: qty 1

## 2023-01-03 MED ORDER — ACETAMINOPHEN 650 MG RE SUPP
650.0000 mg | Freq: Four times a day (QID) | RECTAL | Status: DC | PRN
  Administered 2023-01-03: 650 mg via RECTAL
  Filled 2023-01-03: qty 1

## 2023-01-03 MED ORDER — ALBUTEROL SULFATE (2.5 MG/3ML) 0.083% IN NEBU
2.5000 mg | INHALATION_SOLUTION | Freq: Four times a day (QID) | RESPIRATORY_TRACT | Status: DC | PRN
  Administered 2023-01-03: 2.5 mg via RESPIRATORY_TRACT
  Filled 2023-01-03: qty 3

## 2023-01-03 MED ORDER — SODIUM CHLORIDE 0.9 % IV SOLN
2.0000 g | Freq: Two times a day (BID) | INTRAVENOUS | Status: DC
  Administered 2023-01-03 – 2023-01-07 (×10): 2 g via INTRAVENOUS
  Filled 2023-01-03 (×10): qty 12.5

## 2023-01-03 MED ORDER — SODIUM CHLORIDE 0.9 % IV SOLN
2.0000 g | INTRAVENOUS | Status: DC
  Administered 2023-01-03: 2 g via INTRAVENOUS
  Filled 2023-01-03: qty 20

## 2023-01-03 MED ORDER — FUROSEMIDE 10 MG/ML IJ SOLN
20.0000 mg | Freq: Once | INTRAMUSCULAR | Status: AC
  Administered 2023-01-03: 20 mg via INTRAVENOUS
  Filled 2023-01-03: qty 4

## 2023-01-03 MED ORDER — VANCOMYCIN HCL 750 MG/150ML IV SOLN
750.0000 mg | INTRAVENOUS | Status: DC
  Administered 2023-01-04: 750 mg via INTRAVENOUS
  Filled 2023-01-03: qty 150

## 2023-01-03 MED ORDER — ENOXAPARIN SODIUM 40 MG/0.4ML IJ SOSY
40.0000 mg | PREFILLED_SYRINGE | INTRAMUSCULAR | Status: DC
  Administered 2023-01-03: 40 mg via SUBCUTANEOUS
  Filled 2023-01-03: qty 0.4

## 2023-01-03 MED ORDER — IOHEXOL 350 MG/ML SOLN
75.0000 mL | Freq: Once | INTRAVENOUS | Status: AC | PRN
  Administered 2023-01-03: 75 mL via INTRAVENOUS

## 2023-01-03 MED ORDER — CHLORHEXIDINE GLUCONATE CLOTH 2 % EX PADS
6.0000 | MEDICATED_PAD | Freq: Every day | CUTANEOUS | Status: DC
  Administered 2023-01-03 – 2023-01-07 (×5): 6 via TOPICAL

## 2023-01-03 MED ORDER — METOPROLOL TARTRATE 5 MG/5ML IV SOLN
5.0000 mg | Freq: Four times a day (QID) | INTRAVENOUS | Status: DC | PRN
  Administered 2023-01-03 – 2023-01-06 (×7): 5 mg via INTRAVENOUS
  Filled 2023-01-03 (×7): qty 5

## 2023-01-03 MED ORDER — LACTATED RINGERS IV BOLUS (SEPSIS)
500.0000 mL | Freq: Once | INTRAVENOUS | Status: AC
  Administered 2023-01-03: 500 mL via INTRAVENOUS

## 2023-01-03 MED ORDER — LACTATED RINGERS IV BOLUS (SEPSIS)
1000.0000 mL | Freq: Once | INTRAVENOUS | Status: AC
  Administered 2023-01-03: 1000 mL via INTRAVENOUS

## 2023-01-03 MED ORDER — ONDANSETRON HCL 4 MG PO TABS
4.0000 mg | ORAL_TABLET | Freq: Four times a day (QID) | ORAL | Status: DC | PRN
  Administered 2023-01-06: 4 mg via ORAL
  Filled 2023-01-03: qty 1

## 2023-01-03 MED ORDER — LORAZEPAM 2 MG/ML IJ SOLN
1.0000 mg | Freq: Four times a day (QID) | INTRAMUSCULAR | Status: DC | PRN
  Administered 2023-01-03 – 2023-01-04 (×3): 1 mg via INTRAVENOUS
  Filled 2023-01-03 (×3): qty 1

## 2023-01-03 MED ORDER — VANCOMYCIN HCL 1250 MG/250ML IV SOLN
1250.0000 mg | Freq: Once | INTRAVENOUS | Status: AC
  Administered 2023-01-03: 1250 mg via INTRAVENOUS
  Filled 2023-01-03: qty 250

## 2023-01-03 NOTE — H&P (Signed)
History and Physical    Patient: Brittany Douglas:[id number] DOB: 1941/08/03 DOA: 12/17/2022 DOS: the patient was seen and examined on 12/17/2022 PCP: [name], MD  Patient coming from: SNF  Chief Complaint:  Chief Complaint  Patient presents with   Shortness of Breath   HPI: Brittany Douglas is a 82 y.o. female with medical history significant of hypothyroidism, HTN, anxiety/depression, HLD. Presenting with shortness of breath. History is per daughter at bedside. She was recently discharge to SNF after a stay for right hip fracture and PNA. She was sent home on cefdinir and finished the course. She was doing well until a couple of days ago when she started becoming short of breath. She had increased cough. She didn't didn't feel febrile. Her facility noted that she was hypoxic on RA last night. They ran a CXR and new PNA was noted. She was placed on 4L [place] and given a dose of lvq. She was then transferred to the ED for evaluation.    Review of Systems: As mentioned in the history of present illness. All other systems reviewed and are negative. Past Medical History:  Diagnosis Date   Anxiety    Aortic atherosclerosis (HCC)    Avascular necrosis of femur, right (HCC)    Balance problems    Chronic kidney disease, stage II (mild)    DDD (degenerative disc disease), lumbosacral    Depression    DJD (degenerative joint disease), lumbar    Elevated cholesterol    GERD (gastroesophageal reflux disease)    HOH (hard of hearing)    Hypertension    Hypertensive renal disease, benign    Hypokalemia    Hyponatremia    Hypothyroidism    IBS (irritable bowel syndrome)    Major depression, recurrent (HCC)    Osteopenia    Osteoporosis    Post-menopausal    Reflux    Subclinical hypothyroidism    Varicose vein of leg    Vitamin D deficiency    Past Surgical History:  Procedure Laterality Date   BREAST EXCISIONAL BIOPSY Bilateral    BREAST SURGERY     Benign breast  lumps   CATARACT EXTRACTION     CHOLECYSTECTOMY     FOOT SURGERY     TOTAL HIP ARTHROPLASTY Right 12/21/2022   Procedure: TOTAL HIP ARTHROPLASTY ANTERIOR APPROACH;  Surgeon: Paralee Cancel, MD;  Location: [facility];  Service: Orthopedics;  Laterality: Right;   VAGINAL HYSTERECTOMY     A & P repair   Social History:  reports that she has never smoked. She has never used smokeless tobacco. She reports that she does not drink alcohol and does not use drugs.  Allergies  Allergen Reactions   Nitrofurantoin Nausea Only and Other (See Comments)    Upsets the stomach   Ace Inhibitors Other (See Comments)    Reaction not known by the patient   Codeine Nausea And Vomiting   Cymbalta [Duloxetine Hcl] Other (See Comments)    Reaction not known by the patient   Doxycycline Other (See Comments)    Reaction not known by the patient   Gabapentin Nausea Only   Hctz [Hydrochlorothiazide] Other (See Comments)    Reaction not known by the patient   Lisinopril Cough   Other Other (See Comments)    Unnamed B/P tablet- made the patient "pass out"   Penicillins Hives   Latex Rash   Sulfa Antibiotics Rash   Tape Rash and Other (See Comments)    Cloth tape  preferred- NO latex tape!!    Family History  Problem Relation Age of Onset   Stroke Mother    Hypertension Mother    Uterine cancer Mother    Stroke Father    Heart attack Brother    Stroke Brother    Diabetes Maternal Grandmother    Heart attack Paternal Grandmother    Breast cancer Maternal Aunt    Hyperlipidemia Sister    Thyroid disease Sister    Colon polyps Daughter    Colon polyps Son    Other Grandson        NEOFIBROTOSIS    Prior to Admission medications   Medication Sig Start Date End Date Taking? Authorizing Provider  acetaminophen (TYLENOL) 325 MG tablet Take 1-2 tablets (325-650 mg total) by mouth every 6 (six) hours as needed for mild pain (pain score 1-3 or temp > 100.5). 12/25/22   Shalhoub, Sherryll Burger, MD  acetaminophen  (TYLENOL) 325 MG tablet Take 2 tablets (650 mg total) by mouth 2 (two) times daily. 12/25/22   Shalhoub, Sherryll Burger, MD  ascorbic acid (VITAMIN C) 500 MG tablet Take 500 mg by mouth in the morning.    [provider]  aspirin 81 MG chewable tablet Chew 1 tablet (81 mg total) by mouth 2 (two) times daily for 28 days. 12/25/22 01/22/23  Vernelle Emerald, MD  aspirin 81 MG tablet Take 1 tablet (81 mg total) by mouth at bedtime. Begin daily aspirin regimen after 28-day course of twice daily aspirin is complete. 12/25/22   Shalhoub, Sherryll Burger, MD  B Complex Vitamins (VITAMIN-B COMPLEX) TABS Take 1 tablet by mouth daily with breakfast.    [provider]  CALCIUM CITRATE+D3 PETITES 200-6.25 MG-MCG TABS Take 2 tablets by mouth daily.    [provider]  Cholecalciferol (VITAMIN D3) 50 MCG (2000 UT) TABS Take 2,000 Units by mouth every Monday, Wednesday, and Friday.    [provider]  Coenzyme Q10 (CO Q 10) 100 MG CAPS Take 200 mg by mouth daily.    [provider]  conjugated estrogens (PREMARIN) vaginal cream INSERT 1/4 APPLICATOR VAGINALLY 2 TIMES A WEEK AS DIRECTED Patient taking differently: Place 4.07 Applicatorfuls vaginally 2 (two) times a week. 11/25/22   Marny Lowenstein A, NP  diazepam (VALIUM) 2 MG tablet Take 1 mg by mouth every 6 (six) hours as needed for anxiety.    [provider]  feeding supplement (ENSURE ENLIVE / ENSURE PLUS) LIQD Take 237 mLs by mouth 2 (two) times daily between meals. 12/25/22   Shalhoub, Sherryll Burger, MD  Flaxseed, Linseed, (FLAX SEEDS) POWD Take 15 g by mouth daily with breakfast.    [provider]  levothyroxine (SYNTHROID) 25 MCG tablet Take 25-37.5 mcg by mouth See admin instructions. Take 37.5 mcg by mouth before breakfast on Sun/Sat and 25 mcg on Mon/Tues/Wed/Thurs/Fri 05/22/22   [provider]  loratadine (CLARITIN) 10 MG tablet Take 10 mg by mouth at bedtime.    [provider]  losartan  (COZAAR) 100 MG tablet Take 100 mg by mouth in the morning.    [provider]  methocarbamol (ROBAXIN) 500 MG tablet Take 1 tablet (500 mg total) by mouth every 6 (six) hours as needed for muscle spasms. 12/25/22   Shalhoub, Sherryll Burger, MD  Multiple Vitamin (MULTIVITAMIN) tablet Take 1 tablet by mouth daily with breakfast.    [provider]  Omega-3 Fatty Acids (FISH OIL) 1200 MG CAPS Take 1,200 mg by mouth daily.  [provider]  ondansetron (ZOFRAN) 4 MG tablet Take 1 tablet (4 mg total) by mouth every 6 (six) hours as needed for nausea. 12/25/22   Shalhoub, Sherryll Burger, MD  polyethylene glycol (MIRALAX / GLYCOLAX) 17 g packet Take 17 g by mouth daily as needed. 12/25/22   Shalhoub, Sherryll Burger, MD  rosuvastatin (CRESTOR) 5 MG tablet Take 5 mg by mouth at bedtime.    [provider]  sertraline (ZOLOFT) 100 MG tablet Take 200 mg by mouth in the morning.    [provider]  SIMPLY SALINE 0.9 % AERS Place 1 spray into both nostrils 4 (four) times daily.    [provider]    Physical Exam: Vitals:   01/10/2023 0830 12/28/2022 0845 01/13/2023 0945 12/20/2022 1015  BP: (!) 190/75 (!) 140/68 134/80 (!) 149/74  Pulse: (!) 41 (!) 57 (!) 112 (!) 103  Resp: (!) 38 (!) 35 (!) 26 (!) 24  Temp:      TempSrc:      SpO2: 100% 100% 100% 100%  Weight:      Height:       General: 82 y.o. female resting in bed in NAD Eyes: PERRL, normal sclera ENMT: Nares patent w/o discharge, orophaynx clear, dentition normal, ears w/o discharge/lesions/ulcers Neck: Supple, trachea midline Cardiovascular: tachy, +S1, S2, no m/g/r, equal pulses throughout Respiratory: scattered fine crackles/rhonci, decreased sounds at bases, increased WOB on Bipap. GI: BS+, NDNT, no masses noted, no organomegaly noted MSK: No e/c/c Neuro: A&O x 3, no focal deficits Psyc: Appropriate interaction and affect, calm/cooperative  Data Reviewed:  Results for orders placed or performed during the  hospital encounter of 12/19/2022 (from the past 24 hour(s))  Blood Culture (routine x 2)     Status: None (Preliminary result)   Collection Time: 12/30/2022  8:22 AM   Specimen: BLOOD RIGHT WRIST  Result Value Ref Range   Specimen Description      BLOOD RIGHT WRIST Performed at [facility], [address], [place], [place] [address]    Special Requests      BOTTLES DRAWN AEROBIC AND ANAEROBIC Blood Culture results may not be optimal due to an excessive volume of blood received in culture bottles Performed at [facility] [address], [place], [place] [address]    Culture PENDING    Report Status PENDING   Lactic acid, plasma     Status: None   Collection Time: 12/16/2022  8:25 AM  Result Value Ref Range   Lactic Acid, Venous 1.3 0.5 - 1.9 mmol/L  Comprehensive metabolic panel     Status: Abnormal   Collection Time: 01/04/2023  8:25 AM  Result Value Ref Range   Sodium 134 (L) 135 - 145 mmol/L   Potassium 3.7 3.5 - 5.1 mmol/L   Chloride 97 (L) 98 - 111 mmol/L   CO2 26 22 - 32 mmol/L   Glucose, Bld 163 (H) 70 - 99 mg/dL   BUN 9 8 - 23 mg/dL   Creatinine, Ser 0.52 0.44 - 1.00 mg/dL   Calcium 8.3 (L) 8.9 - 10.3 mg/dL   Total Protein 6.7 6.5 - 8.1 g/dL   Albumin 2.7 (L) 3.5 - 5.0 g/dL   AST 29 15 - 41 U/L   ALT 24 0 - 44 U/L   Alkaline Phosphatase 90 38 - 126 U/L   Total Bilirubin 0.5 0.3 - 1.2 mg/dL   GFR, Estimated >60 >60 mL/min   Anion gap 11 5 - 15  CBC with Differential     Status: Abnormal   Collection Time: 12/21/2022  8:25 AM  Result Value Ref Range   WBC 23.0 (H) 4.0 - 10.5 K/uL   RBC 2.78 (L) 3.87 - 5.11 MIL/uL   Hemoglobin 9.2 (L) 12.0 - 15.0 g/dL   HCT 27.5 (L) 36.0 - 46.0 %   MCV 98.9 80.0 - 100.0 fL   MCH 33.1 26.0 - 34.0 pg   MCHC 33.5 30.0 - 36.0 g/dL   RDW 13.2 11.5 - 15.5 %   Platelets 593 (H) 150 - 400 K/uL   nRBC 0.0 0.0 - 0.2 %   Neutrophils Relative % 93 %   Neutro Abs 21.2 (H) 1.7 - 7.7 K/uL   Lymphocytes Relative 2 %    Lymphs Abs 0.4 (L) 0.7 - 4.0 K/uL   Monocytes Relative 4 %   Monocytes Absolute 1.0 0.1 - 1.0 K/uL   Eosinophils Relative 0 %   Eosinophils Absolute 0.1 0.0 - 0.5 K/uL   Basophils Relative 0 %   Basophils Absolute 0.1 0.0 - 0.1 K/uL   Immature Granulocytes 1 %   Abs Immature Granulocytes 0.23 (H) 0.00 - 0.07 K/uL  Protime-INR     Status: Abnormal   Collection Time: 01/04/2023  8:25 AM  Result Value Ref Range   Prothrombin Time 15.9 (H) 11.4 - 15.2 seconds   INR 1.3 (H) 0.8 - 1.2  APTT     Status: None   Collection Time: 12/24/2022  8:25 AM  Result Value Ref Range   aPTT 31 24 - 36 seconds  Blood gas, venous (at [facility] and [facility])     Status: Abnormal   Collection Time: 12/30/2022  8:25 AM  Result Value Ref Range   pH, Ven 7.37 7.25 - 7.43   pCO2, Ven 50 44 - 60 mmHg   pO2, Ven 54 (H) 32 - 45 mmHg   Bicarbonate 28.9 (H) 20.0 - 28.0 mmol/L   Acid-Base Excess 2.7 (H) 0.0 - 2.0 mmol/L   O2 Saturation 87.1 %   Patient temperature 37.0    CXR: New bilateral patchy airspace process likely multifocal infection.   Assessment and Plan: Multifocal PNA Acute hypoxic respiratory failure Sepsis secondary to above     - admit to inpt, SDU     - COVID/flu/RSV pending     - check MRSA, expectorated sputum     - change abx to vanc, cefepime     - add albuterol PRN, guaifenesin PRN     - check BNP and d-dimer     - check CTA chest     - wean BiPap as able     - NPO until she is off Bipap     - may need SLP eval  Hypothyroidism     - continue home regimen when off NPO status  HTN     - continue home regimen when off NPO status  Anxiety Depression     - continue home regimen when off NPO status  HLD      - continue home regimen when off NPO status  Hyperglycemia     - check A1c  Normocytic anemia     - at baseline, follow  Advance Care Planning:   Code Status: DNR, confirmed through multiple lines of questioning.  Consults: None  Family Communication: w/ dtr at bedside  Severity  of Illness: The appropriate patient status for this patient is INPATIENT. Inpatient status is judged to be reasonable and necessary in  order to provide the required intensity of service to ensure the patient's safety. The patient's presenting symptoms, physical exam findings, and initial radiographic and laboratory data in the context of their chronic comorbidities is felt to place them at high risk for further clinical deterioration. Furthermore, it is not anticipated that the patient will be medically stable for discharge from the hospital within 2 midnights of admission.   * I certify that at the point of admission it is my clinical judgment that the patient will require inpatient hospital care spanning beyond 2 midnights from the point of admission due to high intensity of service, high risk for further deterioration and high frequency of surveillance required.*  Author: Jonnie Finner, DO 12/28/2022 10:27 AM  For on call review [email or web address].

## 2023-01-03 NOTE — ED Triage Notes (Signed)
Pt BIBA from [facility] for [facility]. Had pna a month ago. Last night o2 88% RA, put on 4L. CXR confirmed pna again. 90% on 4L, 96% on NRB. 20ga LF 100 ns. Had hip surgery on 1/7.   120 HR irreg Temp 98 Cbg 185 170/80 Etco2 36 RR 40

## 2023-01-03 NOTE — ED Provider Notes (Signed)
[facility] EMERGENCY DEPARTMENT AT [facility] Provider Note   CSN: [id number] Arrival date & time: 12/20/2022  2878     History  Chief Complaint  Patient presents with   Shortness of Breath    Brittany Douglas is a 82 y.o. female.  82 year old female presents from nursing facility due to shortness of breath.  Patient has had increased cough and congestion.  Had a chest x-ray done at the nursing home which did show pneumonia.  Is unclear if she was started on medication for this.  Does have a history of pneumonia in the past.  EMS called and patient's pulse oximetry was noted to be 80% on 4 L.  Patient placed on nonrebreather due to worsening respiratory status.  Patient denies any fever.  Does note some myalgias.  No urinary symptoms       Home Medications Prior to Admission medications   Medication Sig Start Date End Date Taking? Authorizing Provider  acetaminophen (TYLENOL) 325 MG tablet Take 1-2 tablets (325-650 mg total) by mouth every 6 (six) hours as needed for mild pain (pain score 1-3 or temp > 100.5). 12/25/22   Shalhoub, Sherryll Burger, MD  acetaminophen (TYLENOL) 325 MG tablet Take 2 tablets (650 mg total) by mouth 2 (two) times daily. 12/25/22   Shalhoub, Sherryll Burger, MD  ascorbic acid (VITAMIN C) 500 MG tablet Take 500 mg by mouth in the morning.    [provider]  aspirin 81 MG chewable tablet Chew 1 tablet (81 mg total) by mouth 2 (two) times daily for 28 days. 12/25/22 01/22/23  Vernelle Emerald, MD  aspirin 81 MG tablet Take 1 tablet (81 mg total) by mouth at bedtime. Begin daily aspirin regimen after 28-day course of twice daily aspirin is complete. 12/25/22   Shalhoub, Sherryll Burger, MD  B Complex Vitamins (VITAMIN-B COMPLEX) TABS Take 1 tablet by mouth daily with breakfast.    [provider]  CALCIUM CITRATE+D3 PETITES 200-6.25 MG-MCG TABS Take 2 tablets by mouth daily.    [provider]  Cholecalciferol (VITAMIN D3) 50 MCG (2000 UT)  TABS Take 2,000 Units by mouth every Monday, Wednesday, and Friday.    [provider]  Coenzyme Q10 (CO Q 10) 100 MG CAPS Take 200 mg by mouth daily.    [provider]  conjugated estrogens (PREMARIN) vaginal cream INSERT 1/4 APPLICATOR VAGINALLY 2 TIMES A WEEK AS DIRECTED Patient taking differently: Place 6.76 Applicatorfuls vaginally 2 (two) times a week. 11/25/22   Marny Lowenstein A, NP  diazepam (VALIUM) 2 MG tablet Take 1 mg by mouth every 6 (six) hours as needed for anxiety.    [provider]  feeding supplement (ENSURE ENLIVE / ENSURE PLUS) LIQD Take 237 mLs by mouth 2 (two) times daily between meals. 12/25/22   Shalhoub, Sherryll Burger, MD  Flaxseed, Linseed, (FLAX SEEDS) POWD Take 15 g by mouth daily with breakfast.    [provider]  levothyroxine (SYNTHROID) 25 MCG tablet Take 25-37.5 mcg by mouth See admin instructions. Take 37.5 mcg by mouth before breakfast on Sun/Sat and 25 mcg on Mon/Tues/Wed/Thurs/Fri 05/22/22   [provider]  loratadine (CLARITIN) 10 MG tablet Take 10 mg by mouth at bedtime.    [provider]  losartan (COZAAR) 100 MG tablet Take 100 mg by mouth in the morning.    [provider]  methocarbamol (ROBAXIN) 500 MG tablet Take 1 tablet (500 mg total) by mouth every 6 (six) hours as needed  for muscle spasms. 12/25/22   Shalhoub, Sherryll Burger, MD  Multiple Vitamin (MULTIVITAMIN) tablet Take 1 tablet by mouth daily with breakfast.    [provider]  Omega-3 Fatty Acids (FISH OIL) 1200 MG CAPS Take 1,200 mg by mouth daily.    [provider]  ondansetron (ZOFRAN) 4 MG tablet Take 1 tablet (4 mg total) by mouth every 6 (six) hours as needed for nausea. 12/25/22   Shalhoub, Sherryll Burger, MD  polyethylene glycol (MIRALAX / GLYCOLAX) 17 g packet Take 17 g by mouth daily as needed. 12/25/22   Shalhoub, Sherryll Burger, MD  rosuvastatin (CRESTOR) 5 MG tablet Take 5 mg by mouth at bedtime.    [provider]   sertraline (ZOLOFT) 100 MG tablet Take 200 mg by mouth in the morning.    [provider]  SIMPLY SALINE 0.9 % AERS Place 1 spray into both nostrils 4 (four) times daily.    [provider]      Allergies    Nitrofurantoin, Ace inhibitors, Codeine, Cymbalta [duloxetine hcl], Doxycycline, Gabapentin, Hctz [hydrochlorothiazide], Lisinopril, Other, Penicillins, Latex, Sulfa antibiotics, and Tape    Review of Systems   Review of Systems  Unable to perform ROS: Acuity of condition    Physical Exam Updated Vital Signs BP (!) 195/88 (BP Location: Right Arm)   Pulse (!) 141   Temp 98.8 F (37.1 C) (Rectal)   Resp (!) 25   Ht 1.575 m ('5\' 2"'$ )   Wt 56.7 kg   SpO2 97%   BMI 22.86 kg/m  Physical Exam Vitals and nursing note reviewed.  Constitutional:      General: She is not in acute distress.    Appearance: Normal appearance. She is well-developed. She is not toxic-appearing.  HENT:     Head: Normocephalic and atraumatic.  Eyes:     General: Lids are normal.     Conjunctiva/sclera: Conjunctivae normal.     Pupils: Pupils are equal, round, and reactive to light.  Neck:     Thyroid: No thyroid mass.     Trachea: No tracheal deviation.  Cardiovascular:     Rate and Rhythm: Normal rate and regular rhythm.     Heart sounds: Normal heart sounds. No murmur heard.    No gallop.  Pulmonary:     Effort: Tachypnea, prolonged expiration and respiratory distress present.     Breath sounds: No stridor. Examination of the right-upper field reveals decreased breath sounds. Examination of the left-upper field reveals decreased breath sounds. Decreased breath sounds present. No wheezing, rhonchi or rales.  Abdominal:     General: There is no distension.     Palpations: Abdomen is soft.     Tenderness: There is no abdominal tenderness. There is no rebound.  Musculoskeletal:        General: No tenderness. Normal range of motion.     Cervical back: Normal range of motion and  neck supple.  Skin:    General: Skin is warm and dry.     Findings: No abrasion or rash.  Neurological:     General: No focal deficit present.     Mental Status: She is alert and oriented to person, place, and time. Mental status is at baseline.     GCS: GCS eye subscore is 4. GCS verbal subscore is 5. GCS motor subscore is 6.     Cranial Nerves: No cranial nerve deficit.     Sensory: No sensory deficit.     Motor: Motor function is  intact.  Psychiatric:        Attention and Perception: Attention normal.        Speech: Speech normal.        Behavior: Behavior normal.     ED Results / Procedures / Treatments   Labs (all labs ordered are listed, but only abnormal results are displayed) Labs Reviewed  RESP PANEL BY RT-PCR (RSV, FLU A&B, COVID)  RVPGX2  CULTURE, BLOOD (ROUTINE X 2)  CULTURE, BLOOD (ROUTINE X 2)  URINE CULTURE  RESP PANEL BY RT-PCR (RSV, FLU A&B, COVID)  RVPGX2  LACTIC ACID, PLASMA  LACTIC ACID, PLASMA  COMPREHENSIVE METABOLIC PANEL  CBC WITH DIFFERENTIAL/PLATELET  PROTIME-INR  APTT  URINALYSIS, ROUTINE W REFLEX MICROSCOPIC    EKG None  Radiology No results found.  Procedures Procedures    Medications Ordered in ED Medications  lactated ringers infusion (has no administration in time range)  lactated ringers bolus 1,000 mL (has no administration in time range)    And  lactated ringers bolus 500 mL (has no administration in time range)    And  lactated ringers bolus 250 mL (has no administration in time range)  cefTRIAXone (ROCEPHIN) 2 g in sodium chloride 0.9 % 100 mL IVPB (has no administration in time range)  azithromycin (ZITHROMAX) 500 mg in sodium chloride 0.9 % 250 mL IVPB (has no administration in time range)    ED Course/ Medical Decision Making/ A&P                             Medical Decision Making Amount and/or Complexity of Data Reviewed Labs: ordered. Radiology: ordered. ECG/medicine tests: ordered.  Risk Prescription drug  management.  Patient is EKG per my interpretation shows sinus tachycardia. Patient presented with respiratory distress with history of pneumonia.  Patient given BiPAP to avoid intubation.  Patient's chest x-ray confirms pneumonia per my interpretation.  Has significant leukocytosis.  Sepsis protocol started.  Patient does have a DNR which request compressions but no intubation.  Patient's heart rate and respiratory status have greatly improved since being on BiPAP.  Patient's daughter is at bedside and informed of plan to have patient admitted to the hospital service.  Discussed with hospitalist and they will admit patient.  CRITICAL CARE Performed by: Leota Jacobsen Total critical care time: 50 minutes Critical care time was exclusive of separately billable procedures and treating other patients. Critical care was necessary to treat or prevent imminent or life-threatening deterioration. Critical care was time spent personally by me on the following activities: development of treatment plan with patient and/or surrogate as well as nursing, discussions with consultants, evaluation of patient's response to treatment, examination of patient, obtaining history from patient or surrogate, ordering and performing treatments and interventions, ordering and review of laboratory studies, ordering and review of radiographic studies, pulse oximetry and re-evaluation of patient's condition.         Final Clinical Impression(s) / ED Diagnoses Final diagnoses:  None    Rx / DC Orders ED Discharge Orders     None         Lacretia Leigh, MD 01/05/2023 [phone]

## 2023-01-03 NOTE — Progress Notes (Signed)
Pharmacy Antibiotic Note  Brittany Douglas is a 82 y.o. female admitted on 01/05/2023 with pneumonia.  Pharmacy has been consulted for Vancomycin and Cefepime dosing.  Plan: Vancomycin '1250mg'$  IV x 1, then '750mg'$  IV q24h Vancomycin levels at steady state, as indicated Cefepime 2g IV q12h Monitor renal function, cultures, clinical course F/u MRSA PCR   Height: '5\' 2"'$  (157.5 cm) Weight: 56.7 kg (125 lb) IBW/kg (Calculated) : 50.1  Temp (24hrs), Avg:98.6 F (37 C), Min:98.3 F (36.8 C), Max:98.8 F (37.1 C)  Recent Labs  Lab 12/18/2022 0825  WBC 23.0*  CREATININE 0.52  LATICACIDVEN 1.3    Estimated Creatinine Clearance: 42.9 mL/min (by C-G formula based on SCr of 0.52 mg/dL).    Allergies  Allergen Reactions   Nitrofurantoin Nausea Only and Other (See Comments)    Upsets the stomach   Ace Inhibitors Other (See Comments)    Reaction not known by the patient   Codeine Nausea And Vomiting   Cymbalta [Duloxetine Hcl] Other (See Comments)    Reaction not known by the patient   Doxycycline Other (See Comments)    Reaction not known by the patient   Gabapentin Nausea Only   Hctz [Hydrochlorothiazide] Other (See Comments)    Reaction not known by the patient   Lisinopril Cough   Other Other (See Comments)    Unnamed B/P tablet- made the patient "pass out"   Penicillins Hives   Latex Rash   Sulfa Antibiotics Rash   Tape Rash and Other (See Comments)    Cloth tape preferred- NO latex tape!!    Antimicrobials this admission: 1/20 Ceftriaxone x 1 1/20 Azithromycin x 1 1/20 Vancomycin >> 1/20 Cefepime >>  Dose adjustments this admission: --  Microbiology results: 1/20 BCx:  1/20 UCx:   1/20 Sputum:   1/20 MRSA PCR:  1/20 Resp panel:   Thank you for allowing pharmacy to be a part of this patient's care.   Lindell Spar, PharmD, BCPS Clinical Pharmacist 01/04/2023 11:55 AM

## 2023-01-03 NOTE — ED Notes (Signed)
Pt off ER floor at 1608- forgot to move pt in computer.

## 2023-01-03 NOTE — Progress Notes (Signed)
[facility] following for sepsis protocol. 

## 2023-01-04 DIAGNOSIS — E039 Hypothyroidism, unspecified: Secondary | ICD-10-CM

## 2023-01-04 DIAGNOSIS — J189 Pneumonia, unspecified organism: Secondary | ICD-10-CM

## 2023-01-04 DIAGNOSIS — I10 Essential (primary) hypertension: Secondary | ICD-10-CM

## 2023-01-04 DIAGNOSIS — I5033 Acute on chronic diastolic (congestive) heart failure: Secondary | ICD-10-CM

## 2023-01-04 DIAGNOSIS — J9601 Acute respiratory failure with hypoxia: Secondary | ICD-10-CM | POA: Diagnosis not present

## 2023-01-04 LAB — COMPREHENSIVE METABOLIC PANEL
ALT: 33 U/L (ref 0–44)
AST: 40 U/L (ref 15–41)
Albumin: 2.6 g/dL — ABNORMAL LOW (ref 3.5–5.0)
Alkaline Phosphatase: 130 U/L — ABNORMAL HIGH (ref 38–126)
Anion gap: 12 (ref 5–15)
BUN: 14 mg/dL (ref 8–23)
CO2: 27 mmol/L (ref 22–32)
Calcium: 8 mg/dL — ABNORMAL LOW (ref 8.9–10.3)
Chloride: 98 mmol/L (ref 98–111)
Creatinine, Ser: 0.52 mg/dL (ref 0.44–1.00)
GFR, Estimated: >60 mL/min (ref >60)
Glucose, Bld: 121 mg/dL — ABNORMAL HIGH (ref 70–99)
Potassium: 3.1 mmol/L — ABNORMAL LOW (ref 3.5–5.1)
Sodium: 137 mmol/L (ref 135–145)
Total Bilirubin: 0.6 mg/dL (ref 0.3–1.2)
Total Protein: 6.5 g/dL (ref 6.5–8.1)

## 2023-01-04 LAB — CBC
HCT: 27.2 % — ABNORMAL LOW (ref 36.0–46.0)
Hemoglobin: 9 g/dL — ABNORMAL LOW (ref 12.0–15.0)
MCH: 32.6 pg (ref 26.0–34.0)
MCHC: 33.1 g/dL (ref 30.0–36.0)
MCV: 98.6 fL (ref 80.0–100.0)
Platelets: 598 K/uL — ABNORMAL HIGH (ref 150–400)
RBC: 2.76 MIL/uL — ABNORMAL LOW (ref 3.87–5.11)
RDW: 13.3 % (ref 11.5–15.5)
WBC: 17.8 K/uL — ABNORMAL HIGH (ref 4.0–10.5)
nRBC: 0 % (ref 0.0–0.2)

## 2023-01-04 LAB — HEPARIN LEVEL (UNFRACTIONATED): Heparin Unfractionated: <0.1 [IU]/mL — ABNORMAL LOW (ref 0.30–0.70)

## 2023-01-04 MED ORDER — FUROSEMIDE 10 MG/ML IJ SOLN
20.0000 mg | Freq: Once | INTRAMUSCULAR | Status: AC
  Administered 2023-01-04: 20 mg via INTRAVENOUS
  Filled 2023-01-04: qty 2

## 2023-01-04 MED ORDER — ASPIRIN 81 MG PO CHEW
81.0000 mg | CHEWABLE_TABLET | Freq: Two times a day (BID) | ORAL | Status: DC
  Administered 2023-01-04 – 2023-01-07 (×7): 81 mg via ORAL
  Filled 2023-01-04 (×7): qty 1

## 2023-01-04 MED ORDER — POTASSIUM CHLORIDE 20 MEQ/15ML (10%) PO SOLN
40.0000 meq | ORAL | Status: AC
  Administered 2023-01-04 (×2): 40 meq via ORAL
  Filled 2023-01-04 (×2): qty 30

## 2023-01-04 MED ORDER — LEVOTHYROXINE SODIUM 75 MCG PO TABS: 37.5000 ug | ORAL_TABLET | ORAL | Status: DC

## 2023-01-04 MED ORDER — HEPARIN BOLUS VIA INFUSION
1500.0000 [IU] | Freq: Once | INTRAVENOUS | Status: AC
  Administered 2023-01-05: 1500 [IU] via INTRAVENOUS
  Filled 2023-01-04: qty 1500

## 2023-01-04 MED ORDER — METOPROLOL TARTRATE 25 MG PO TABS
25.0000 mg | ORAL_TABLET | Freq: Two times a day (BID) | ORAL | Status: DC
  Administered 2023-01-04 (×2): 25 mg via ORAL
  Filled 2023-01-04 (×2): qty 1

## 2023-01-04 MED ORDER — IPRATROPIUM-ALBUTEROL 0.5-2.5 (3) MG/3ML IN SOLN
3.0000 mL | Freq: Four times a day (QID) | RESPIRATORY_TRACT | Status: DC
  Filled 2023-01-04: qty 3

## 2023-01-04 MED ORDER — LEVOTHYROXINE SODIUM 25 MCG PO TABS
25.0000 ug | ORAL_TABLET | ORAL | Status: DC
  Administered 2023-01-05 – 2023-01-07 (×3): 25 ug via ORAL
  Filled 2023-01-04 (×3): qty 1

## 2023-01-04 MED ORDER — POTASSIUM CHLORIDE CRYS ER 20 MEQ PO TBCR: 40.0000 meq | EXTENDED_RELEASE_TABLET | ORAL | Status: DC

## 2023-01-04 MED ORDER — DILTIAZEM HCL-DEXTROSE 125-5 MG/125ML-% IV SOLN (PREMIX)
5.0000 mg/h | INTRAVENOUS | Status: DC
  Administered 2023-01-04 – 2023-01-05 (×2): 5 mg/h via INTRAVENOUS
  Filled 2023-01-04 (×2): qty 125

## 2023-01-04 MED ORDER — IPRATROPIUM BROMIDE 0.02 % IN SOLN
0.5000 mg | Freq: Four times a day (QID) | RESPIRATORY_TRACT | Status: DC
  Administered 2023-01-04 – 2023-01-08 (×16): 0.5 mg via RESPIRATORY_TRACT
  Filled 2023-01-04 (×16): qty 2.5

## 2023-01-04 MED ORDER — HEPARIN (PORCINE) 25000 UT/250ML-% IV SOLN
1450.0000 [IU]/h | INTRAVENOUS | Status: DC
  Administered 2023-01-04: 850 [IU]/h via INTRAVENOUS
  Administered 2023-01-05: 1050 [IU]/h via INTRAVENOUS
  Administered 2023-01-06: 1450 [IU]/h via INTRAVENOUS
  Administered 2023-01-06: 1350 [IU]/h via INTRAVENOUS
  Filled 2023-01-04 (×4): qty 250

## 2023-01-04 MED ORDER — ROSUVASTATIN CALCIUM 10 MG PO TABS
5.0000 mg | ORAL_TABLET | Freq: Every day | ORAL | Status: DC
  Administered 2023-01-04 – 2023-01-06 (×3): 5 mg via ORAL
  Filled 2023-01-04 (×4): qty 1

## 2023-01-04 MED ORDER — ACETAMINOPHEN 325 MG PO TABS
650.0000 mg | ORAL_TABLET | Freq: Four times a day (QID) | ORAL | Status: AC | PRN
  Administered 2023-01-04 – 2023-01-06 (×2): 650 mg via ORAL
  Filled 2023-01-04 (×2): qty 2

## 2023-01-04 MED ORDER — POTASSIUM CHLORIDE CRYS ER 20 MEQ PO TBCR: 20.0000 meq | EXTENDED_RELEASE_TABLET | Freq: Once | ORAL | Status: DC

## 2023-01-04 MED ORDER — HEPARIN BOLUS VIA INFUSION
2500.0000 [IU] | Freq: Once | INTRAVENOUS | Status: AC
  Administered 2023-01-04: 2500 [IU] via INTRAVENOUS
  Filled 2023-01-04: qty 2500

## 2023-01-04 MED ORDER — DIAZEPAM 2 MG PO TABS
1.0000 mg | ORAL_TABLET | Freq: Four times a day (QID) | ORAL | Status: DC | PRN
  Administered 2023-01-04 – 2023-01-07 (×10): 1 mg via ORAL
  Filled 2023-01-04 (×10): qty 1

## 2023-01-04 MED ORDER — ACETAMINOPHEN 650 MG RE SUPP
650.0000 mg | Freq: Four times a day (QID) | RECTAL | Status: AC | PRN
  Administered 2023-01-07 (×2): 650 mg via RECTAL
  Filled 2023-01-04 (×2): qty 1

## 2023-01-04 MED ORDER — LEVALBUTEROL HCL 0.63 MG/3ML IN NEBU
0.6300 mg | INHALATION_SOLUTION | Freq: Four times a day (QID) | RESPIRATORY_TRACT | Status: DC | PRN
  Filled 2023-01-04: qty 3

## 2023-01-04 MED ORDER — FUROSEMIDE 10 MG/ML IJ SOLN
40.0000 mg | Freq: Once | INTRAMUSCULAR | Status: AC
  Administered 2023-01-04: 40 mg via INTRAVENOUS
  Filled 2023-01-04: qty 4

## 2023-01-04 MED ORDER — LEVALBUTEROL HCL 0.63 MG/3ML IN NEBU
0.6300 mg | INHALATION_SOLUTION | Freq: Four times a day (QID) | RESPIRATORY_TRACT | Status: DC
  Administered 2023-01-04 – 2023-01-08 (×16): 0.63 mg via RESPIRATORY_TRACT
  Filled 2023-01-04 (×16): qty 3

## 2023-01-04 MED ORDER — METHOCARBAMOL 500 MG PO TABS
500.0000 mg | ORAL_TABLET | Freq: Once | ORAL | Status: AC | PRN
  Administered 2023-01-04: 500 mg via ORAL
  Filled 2023-01-04: qty 1

## 2023-01-04 NOTE — Progress Notes (Signed)
ANTICOAGULATION CONSULT NOTE - Follow-Up Consult  Pharmacy Consult for heparin Indication: atrial fibrillation  Allergies  Allergen Reactions   Nitrofurantoin Nausea Only and Other (See Comments)    Upsets the stomach and "Allergic," per [facility]   Ace Inhibitors Other (See Comments)    "Allergic," per [facility]   Codeine Nausea And Vomiting and Other (See Comments)    "Allergic," per [facility]   Cymbalta [Duloxetine Hcl] Other (See Comments)    "Allergic," per [facility]   Doxycycline Other (See Comments)    "Allergic," per [facility]   Gabapentin Nausea Only and Other (See Comments)    "Allergic," per [facility]   Hctz [Hydrochlorothiazide] Other (See Comments)    "Allergic," per [facility]   Lisinopril Other (See Comments) and Cough    "Allergic," per [facility]   Other Other (See Comments)    Unnamed B/P tablet- made the patient "pass out"   Penicillins Hives   Latex Rash   Sulfa Antibiotics Rash   Tape Rash and Other (See Comments)    Cloth tape preferred- NO latex tape!!    Patient Measurements: Height: '5\' 2"'$  (157.5 cm) Weight: 56.7 kg (125 lb) IBW/kg (Calculated) : 50.1 Heparin Dosing Weight: actual body weight  Vital Signs: Temp: 97.7 F (36.5 C) (01/21 2011) Temp Source: Oral (01/21 2011) BP: 157/68 (01/21 2200) Pulse Rate: 76 (01/21 2200)  Labs: Recent Labs    01/14/2023 0825 01/04/23 1002 01/04/23 2211  HGB 9.2* 9.0*  --   HCT 27.5* 27.2*  --   PLT 593* 598*  --   APTT 31  --   --   LABPROT 15.9*  --   --   INR 1.3*  --   --   HEPARINUNFRC  --   --  <0.10*  CREATININE 0.52 0.52  --      Estimated Creatinine Clearance: 42.9 mL/min (by C-G formula based on SCr of 0.52 mg/dL).   Medical History: Past Medical History:  Diagnosis Date   Anxiety    Aortic atherosclerosis (HCC)    Avascular necrosis of femur, right (HCC)    Balance problems    Chronic kidney disease, stage II (mild)    DDD (degenerative disc disease), lumbosacral    Depression    DJD (degenerative joint disease), lumbar     Elevated cholesterol    GERD (gastroesophageal reflux disease)    HOH (hard of hearing)    Hypertension    Hypertensive renal disease, benign    Hypokalemia    Hyponatremia    Hypothyroidism    IBS (irritable bowel syndrome)    Major depression, recurrent (HCC)    Osteopenia    Osteoporosis    Post-menopausal    Reflux    Subclinical hypothyroidism    Varicose vein of leg    Vitamin D deficiency     Medications:  Medications Prior to Admission  Medication Sig Dispense Refill Last Dose   acetaminophen (TYLENOL) 325 MG tablet Take 1-2 tablets (325-650 mg total) by mouth every 6 (six) hours as needed for mild pain (pain score 1-3 or temp > 100.5). (Patient taking differently: Take 650 mg by mouth 3 (three) times daily.)   01/02/2023 at 2000   ascorbic acid (VITAMIN C) 500 MG tablet Take 500 mg by mouth in the morning.   01/02/2023 at 0800   aspirin 81 MG tablet Take 1 tablet (81 mg total) by mouth at bedtime. Begin daily aspirin regimen after 28-day course of twice daily aspirin is complete. (Patient taking differently: Take 94  mg by mouth See admin instructions. Starting on 01/01/2023, take 81 mg by mouth at 8 AM and 8 PM for 2 weeks) 30 tablet  01/02/2023 at 2000   B Complex Vitamins (VITAMIN-B COMPLEX) TABS Take 1 tablet by mouth daily with breakfast.   01/02/2023 at 0800   CALCIUM CITRATE+D3 PETITES 200-6.25 MG-MCG TABS Take 2 tablets by mouth daily.   01/02/2023 at 0800   Cholecalciferol (VITAMIN D3) 50 MCG (2000 UT) TABS Take 2,000 Units by mouth every Monday, Wednesday, and Friday.   01/02/2023 at 0800   Coenzyme Q10 (CO Q 10) 100 MG CAPS Take 200 mg by mouth daily.   01/02/2023 at 0800   conjugated estrogens (PREMARIN) vaginal cream INSERT 1/4 APPLICATOR VAGINALLY 2 TIMES A WEEK AS DIRECTED (Patient taking differently: Place 1.61 Applicatorfuls vaginally See admin instructions. Place 0.96 applicator vaginally once a day only on Tuesdays and Fridays) 30 g 1 01/02/2023 at am   diazepam  (VALIUM) 2 MG tablet Take 1 mg by mouth every 6 (six) hours as needed for anxiety.   01/01/2023 at 0119   Flaxseed, Linseed, (FLAX SEEDS) POWD Take 15 g by mouth daily with breakfast.   01/02/2023 at 0800   levofloxacin (LEVAQUIN) 750 MG tablet Take 750 mg by mouth daily.   01/12/2023 at 0200   levothyroxine (SYNTHROID) 25 MCG tablet Take 25-37.5 mcg by mouth See admin instructions. Take 37.5 mcg by mouth before breakfast on Sun/Sat and 25 mcg on Mon/Tues/Wed/Thurs/Fri   01/01/2023 at 0600   loratadine (CLARITIN) 10 MG tablet Take 10 mg by mouth at bedtime.   01/02/2023 at 2000   losartan (COZAAR) 100 MG tablet Take 100 mg by mouth See admin instructions. Take 100 mg by mouth in the morning and notify the MD if the Systolic number is <04 or >540 OR Diastolic number is <98 or >100   01/02/2023 at 0800   methocarbamol (ROBAXIN) 500 MG tablet Take 1 tablet (500 mg total) by mouth every 6 (six) hours as needed for muscle spasms.   01/02/2023 at 0000   Multiple Vitamin (MULTIVITAMIN) tablet Take 1 tablet by mouth daily with breakfast.   01/02/2023 at 0800   NON FORMULARY Take by mouth See admin instructions. MedPass 2.0- As directed 2 times a day for nutritional supplement   01/02/2023 at pm   Omega-3 Fatty Acids (FISH OIL) 1200 MG CAPS Take 1,200 mg by mouth daily.   01/02/2023 at 0800   ondansetron (ZOFRAN) 4 MG tablet Take 1 tablet (4 mg total) by mouth every 6 (six) hours as needed for nausea. 20 tablet 0 unk   polyethylene glycol (MIRALAX / GLYCOLAX) 17 g packet Take 17 g by mouth daily as needed. (Patient taking differently: Take 17 g by mouth daily as needed for mild constipation (to be mixed into 6-8 ounces of juice or water).) 14 each 0 unk   rosuvastatin (CRESTOR) 5 MG tablet Take 5 mg by mouth at bedtime.   01/02/2023 at 2000   sertraline (ZOLOFT) 100 MG tablet Take 200 mg by mouth in the morning.   01/02/2023 at 0800   SIMPLY SALINE 0.9 % AERS Place 1 spray into both nostrils 4 (four) times daily.    01/02/2023 at 2000   acetaminophen (TYLENOL) 325 MG tablet Take 2 tablets (650 mg total) by mouth 2 (two) times daily. (Patient not taking: Reported on 12/31/2022)   Not Taking   aspirin 81 MG chewable tablet Chew 1 tablet (81 mg total) by mouth 2 (two)  times daily for 28 days. (Patient not taking: Reported on 01/05/2023) 56 tablet 0 Not Taking   feeding supplement (ENSURE ENLIVE / ENSURE PLUS) LIQD Take 237 mLs by mouth 2 (two) times daily between meals. (Patient not taking: Reported on 12/17/2022) 237 mL 12 Not Taking    Assessment: 82 yo F with new onset AFib w/ RVR.  Pharmacy consulted to dose heparin.  SCr WNL, Hg 9 - stable. PLT elevated @ 598.  No bleeding reported.   01/04/23 Heparin level < 0.1 (subtherapeutic) with heparin gtt @ 850 units/hr RN confirmed no interruption in heparin therapy/no infusion related problems No bleeding complications reported  Goal of Therapy:  Heparin level 0.3-0.7 units/ml Monitor platelets by anticoagulation protocol: Yes   Plan:  Rebolus Heparin 1500 unit IV x 1 Increase Heparin drip to 1050 units/hr Check 8 hr heparin level after rate increased Daily CBC & heparin level  Leone Haven, PharmD 01/04/2023 11:47 PM

## 2023-01-04 NOTE — Progress Notes (Signed)
Pt is resting well at this time. No resp distress noted. Machine remained bedside if needed. Cinday RN aware and will call if needed to place pt on bipap.

## 2023-01-04 NOTE — Progress Notes (Addendum)
Patient found to be tachycardic, EKG shows A-fib with RVR.    On exam Patient is tachypneic Bilateral crackles auscultated  Will start Cardizem infusion gtt. CHA2DS2VASc score is at least 4-5.  Will start anticoagulation with heparin. She received Lasix 40 mg IV earlier this morning Continue metoprolol 25 mg p.o. twice daily  Critical care time spent is 40 minutes

## 2023-01-04 NOTE — Progress Notes (Signed)
[facility]  PROGRESS NOTE  Jalonda Antigua Damon OEU:[id number] DOB: [birth year] DOA: 01/02/2023 PCP: [name], MD   Brief HPI:   82 year old female with medical history of hypothyroidism, hypertension, anxiety/depression, hyperlipidemia presented with shortness of breath.  She was recently discharged to SNF after a stay for right hip fracture and pneumonia.  She was sent home on cefdinir and finished antibiotics course.  She started becoming short of breath.  At the facility she was short of breath and found to have pneumonia on chest x-ray.  She was placed on 4 L nasal cannula and transferred to ED for evaluation. Patient was started on vancomycin and cefepime.    Subjective   Patient seen and examined, still on BiPAP.   Assessment/Plan:    Acute hypoxemic respiratory failure -Multifactorial, chest x-ray showed new bilateral patchy airspace process likely multifocal infection -Also BNP elevated to 765; echocardiogram from 12/25/2022 showed grade 2 diastolic dysfunction -Patient has been intermittently put on BiPAP; patient is on 4 L of oxygen -Patient started on vancomycin and cefepime -Follow blood culture results; procalcitonin 0.12 -WBC is down to 17,000 -Influenza A, influenza B, RSV, SARS-CoV-2 RT-PCR negative -Give 1 dose of Lasix 40 mg IV  Hypothyroidism -P.o. meds on hold while patient on BiPAP   Hypertension -P.o. meds on hold due to n.p.o. status  Hyperlipidemia -Restart statin when patient taking p.o.  Normocytic anemia -At baseline   Medications     Chlorhexidine Gluconate Cloth  6 each Topical Daily   enoxaparin (LOVENOX) injection  40 mg Subcutaneous Q24H     Data Reviewed:   CBG:  No results for input(s): "GLUCAP" in the last 168 hours.  SpO2: 96 % FiO2 (%): (S) 35 % (was 97% on 40%)    Vitals:   01/04/23 0700 01/04/23 0800 01/04/23 0809 01/04/23 0839  BP: (!) 186/103 (!) 154/63 (!) 154/63   Pulse: (!) 54 (!) 111 (!) 103    Resp: (!) 34 (!) 28 (!) 26   Temp:    98.5 F (36.9 C)  TempSrc:    Oral  SpO2: 96% 95% 96%   Weight:      Height:          Data Reviewed:  Basic Metabolic Panel: Recent Labs  Lab 12/16/2022 0825  NA 134*  K 3.7  CL 97*  CO2 26  GLUCOSE 163*  BUN 9  CREATININE 0.52  CALCIUM 8.3*    CBC: Recent Labs  Lab 01/02/2023 0825 01/04/23 1002  WBC 23.0* 17.8*  NEUTROABS 21.2*  --   HGB 9.2* 9.0*  HCT 27.5* 27.2*  MCV 98.9 98.6  PLT 593* 598*    LFT Recent Labs  Lab 01/09/2023 0825  AST 29  ALT 24  ALKPHOS 90  BILITOT 0.5  PROT 6.7  ALBUMIN 2.7*     Antibiotics: Anti-infectives (From admission, onward)    Start     Dose/Rate Route Frequency Ordered Stop   01/04/23 1200  vancomycin (VANCOREADY) IVPB 750 mg/150 mL        750 mg 150 mL/hr over 60 Minutes Intravenous Every 24 hours 12/17/2022 1155     12/28/2022 1130  ceFEPIme (MAXIPIME) 2 g in sodium chloride 0.9 % 100 mL IVPB        2 g 200 mL/hr over 30 Minutes Intravenous Every 12 hours 12/18/2022 1116     12/24/2022 1130  vancomycin (VANCOREADY) IVPB 1250 mg/250 mL        1,250 mg 166.7 mL/hr over 90 Minutes  Intravenous  Once 01/04/2023 1116 12/31/2022 1356   12/17/2022 0800  cefTRIAXone (ROCEPHIN) 2 g in sodium chloride 0.9 % 100 mL IVPB  Status:  Discontinued        2 g 200 mL/hr over 30 Minutes Intravenous Every 24 hours 01/02/2023 0759 12/26/2022 1101   12/27/2022 0800  azithromycin (ZITHROMAX) 500 mg in sodium chloride 0.9 % 250 mL IVPB  Status:  Discontinued        500 mg 250 mL/hr over 60 Minutes Intravenous Every 24 hours 12/18/2022 0759 12/27/2022 1101        DVT prophylaxis:   Code Status: DNR  Family Communication: Discussed with patient's daughter at bedside   CONSULTS    Objective    Physical Examination:   General: Appears lethargic Cardiovascular: S1-S2, regular Respiratory: Crackles noted in left upper and left lower lung fields Abdomen: Soft, nontender, no organomegaly Extremities: No edema  in the lower extremities Neurologic: Somnolent but arousable, on BiPAP, following commands   Status is: Inpatient:          Oswald Hillock   [facility] If 7PM-7AM, please contact night-coverage at [email or web address], Office  [phone]   01/04/2023, 10:34 AM  LOS: 1 day

## 2023-01-04 NOTE — Progress Notes (Signed)
Removed BiPAP from PT and placed PT on 3 LPM nasal cannula with Sp02 94%, RR 33, HR 111. PT states she is breathing "better". RN aware.

## 2023-01-04 NOTE — Progress Notes (Signed)
ANTICOAGULATION CONSULT NOTE - Initial Consult  Pharmacy Consult for heparin Indication: atrial fibrillation  Allergies  Allergen Reactions   Nitrofurantoin Nausea Only and Other (See Comments)    Upsets the stomach and "Allergic," per [facility]   Ace Inhibitors Other (See Comments)    "Allergic," per [facility]   Caffeine Other (See Comments)    "Allergic," per [facility]   Codeine Nausea And Vomiting and Other (See Comments)    "Allergic," per [facility]   Cymbalta [Duloxetine Hcl] Other (See Comments)    "Allergic," per [facility]   Doxycycline Other (See Comments)    "Allergic," per [facility]   Gabapentin Nausea Only and Other (See Comments)    "Allergic," per [facility]   Hctz [Hydrochlorothiazide] Other (See Comments)    "Allergic," per [facility]   Lisinopril Other (See Comments) and Cough    "Allergic," per [facility]   Other Other (See Comments)    Unnamed B/P tablet- made the patient "pass out"   Penicillins Hives   Latex Rash   Sulfa Antibiotics Rash   Tape Rash and Other (See Comments)    Cloth tape preferred- NO latex tape!!    Patient Measurements: Height: '5\' 2"'$  (157.5 cm) Weight: 56.7 kg (125 lb) IBW/kg (Calculated) : 50.1 Heparin Dosing Weight: 56. 7 kg  Vital Signs: Temp: 98.5 F (36.9 C) (01/21 0839) Temp Source: Oral (01/21 0839) BP: 165/96 (01/21 1215) Pulse Rate: 153 (01/21 1215)  Labs: Recent Labs    01/13/2023 0825 01/04/23 1002  HGB 9.2* 9.0*  HCT 27.5* 27.2*  PLT 593* 598*  APTT 31  --   LABPROT 15.9*  --   INR 1.3*  --   CREATININE 0.52 0.52    Estimated Creatinine Clearance: 42.9 mL/min (by C-G formula based on SCr of 0.52 mg/dL).   Medical History: Past Medical History:  Diagnosis Date   Anxiety    Aortic atherosclerosis (HCC)    Avascular necrosis of femur, right (HCC)    Balance problems    Chronic kidney disease, stage II (mild)    DDD (degenerative disc disease), lumbosacral    Depression    DJD (degenerative joint disease), lumbar    Elevated cholesterol    GERD  (gastroesophageal reflux disease)    HOH (hard of hearing)    Hypertension    Hypertensive renal disease, benign    Hypokalemia    Hyponatremia    Hypothyroidism    IBS (irritable bowel syndrome)    Major depression, recurrent (HCC)    Osteopenia    Osteoporosis    Post-menopausal    Reflux    Subclinical hypothyroidism    Varicose vein of leg    Vitamin D deficiency     Medications:  Medications Prior to Admission  Medication Sig Dispense Refill Last Dose   acetaminophen (TYLENOL) 325 MG tablet Take 1-2 tablets (325-650 mg total) by mouth every 6 (six) hours as needed for mild pain (pain score 1-3 or temp > 100.5). (Patient taking differently: Take 650 mg by mouth 3 (three) times daily.)   01/02/2023 at 2000   ascorbic acid (VITAMIN C) 500 MG tablet Take 500 mg by mouth in the morning.   01/02/2023 at 0800   aspirin 81 MG tablet Take 1 tablet (81 mg total) by mouth at bedtime. Begin daily aspirin regimen after 28-day course of twice daily aspirin is complete. (Patient taking differently: Take 81 mg by mouth See admin instructions. Starting on 01/01/2023, take 81 mg by mouth at 8 AM and 8 PM for  2 weeks) 30 tablet  01/02/2023 at 2000   B Complex Vitamins (VITAMIN-B COMPLEX) TABS Take 1 tablet by mouth daily with breakfast.   01/02/2023 at 0800   CALCIUM CITRATE+D3 PETITES 200-6.25 MG-MCG TABS Take 2 tablets by mouth daily.   01/02/2023 at 0800   Cholecalciferol (VITAMIN D3) 50 MCG (2000 UT) TABS Take 2,000 Units by mouth every Monday, Wednesday, and Friday.   01/02/2023 at 0800   Coenzyme Q10 (CO Q 10) 100 MG CAPS Take 200 mg by mouth daily.   01/02/2023 at 0800   conjugated estrogens (PREMARIN) vaginal cream INSERT 1/4 APPLICATOR VAGINALLY 2 TIMES A WEEK AS DIRECTED (Patient taking differently: Place 2.87 Applicatorfuls vaginally See admin instructions. Place 6.81 applicator vaginally once a day only on Tuesdays and Fridays) 30 g 1 01/02/2023 at am   diazepam (VALIUM) 2 MG tablet Take 1 mg by  mouth every 6 (six) hours as needed for anxiety.   12/29/2022 at 0119   Flaxseed, Linseed, (FLAX SEEDS) POWD Take 15 g by mouth daily with breakfast.   01/02/2023 at 0800   levofloxacin (LEVAQUIN) 750 MG tablet Take 750 mg by mouth daily.   12/30/2022 at 0200   levothyroxine (SYNTHROID) 25 MCG tablet Take 25-37.5 mcg by mouth See admin instructions. Take 37.5 mcg by mouth before breakfast on Sun/Sat and 25 mcg on Mon/Tues/Wed/Thurs/Fri   01/05/2023 at 0600   loratadine (CLARITIN) 10 MG tablet Take 10 mg by mouth at bedtime.   01/02/2023 at 2000   losartan (COZAAR) 100 MG tablet Take 100 mg by mouth See admin instructions. Take 100 mg by mouth in the morning and notify the MD if the Systolic number is <15 or >726 OR Diastolic number is <20 or >100   01/02/2023 at 0800   methocarbamol (ROBAXIN) 500 MG tablet Take 1 tablet (500 mg total) by mouth every 6 (six) hours as needed for muscle spasms.   01/02/2023 at 0000   Multiple Vitamin (MULTIVITAMIN) tablet Take 1 tablet by mouth daily with breakfast.   01/02/2023 at 0800   NON FORMULARY Take by mouth See admin instructions. MedPass 2.0- As directed 2 times a day for nutritional supplement   01/02/2023 at pm   Omega-3 Fatty Acids (FISH OIL) 1200 MG CAPS Take 1,200 mg by mouth daily.   01/02/2023 at 0800   ondansetron (ZOFRAN) 4 MG tablet Take 1 tablet (4 mg total) by mouth every 6 (six) hours as needed for nausea. 20 tablet 0 unk   polyethylene glycol (MIRALAX / GLYCOLAX) 17 g packet Take 17 g by mouth daily as needed. (Patient taking differently: Take 17 g by mouth daily as needed for mild constipation (to be mixed into 6-8 ounces of juice or water).) 14 each 0 unk   rosuvastatin (CRESTOR) 5 MG tablet Take 5 mg by mouth at bedtime.   01/02/2023 at 2000   sertraline (ZOLOFT) 100 MG tablet Take 200 mg by mouth in the morning.   01/02/2023 at 0800   SIMPLY SALINE 0.9 % AERS Place 1 spray into both nostrils 4 (four) times daily.   01/02/2023 at 2000   acetaminophen  (TYLENOL) 325 MG tablet Take 2 tablets (650 mg total) by mouth 2 (two) times daily. (Patient not taking: Reported on 01/02/2023)   Not Taking   aspirin 81 MG chewable tablet Chew 1 tablet (81 mg total) by mouth 2 (two) times daily for 28 days. (Patient not taking: Reported on 12/21/2022) 56 tablet 0 Not Taking   feeding supplement (ENSURE  ENLIVE / ENSURE PLUS) LIQD Take 237 mLs by mouth 2 (two) times daily between meals. (Patient not taking: Reported on 12/31/2022) 237 mL 12 Not Taking    Assessment: 82 yo F with new onset AFib w/ RVR.  Pharmacy consulted to dose heparin.  SCr WNL, Hg 9 - stable. PLT elevated @ 598.  No bleeding reported.   Goal of Therapy:  Heparin level 0.3-0.7 units/ml Monitor platelets by anticoagulation protocol: Yes   Plan:  Heparin 2500 unit IV bolus Heparin drip @ 850 units/hr Check 8 hr heparin level Daily CBC & heparin level  Eudelia Bunch, Pharm.D Use secure chat for questions 01/04/2023 1:08 PM

## 2023-01-05 LAB — HEPARIN LEVEL (UNFRACTIONATED)
Heparin Unfractionated: <0.1 IU/mL — ABNORMAL LOW (ref 0.30–0.70)
Heparin Unfractionated: 0.23 IU/mL — ABNORMAL LOW (ref 0.30–0.70)

## 2023-01-05 LAB — CBC
HCT: 28.8 % — ABNORMAL LOW (ref 36.0–46.0)
Hemoglobin: 9.3 g/dL — ABNORMAL LOW (ref 12.0–15.0)
MCH: 32.1 pg (ref 26.0–34.0)
MCHC: 32.3 g/dL (ref 30.0–36.0)
MCV: 99.3 fL (ref 80.0–100.0)
Platelets: 667 10*3/uL — ABNORMAL HIGH (ref 150–400)
RBC: 2.9 MIL/uL — ABNORMAL LOW (ref 3.87–5.11)
RDW: 13.5 % (ref 11.5–15.5)
WBC: 20.4 10*3/uL — ABNORMAL HIGH (ref 4.0–10.5)
nRBC: 0 % (ref 0.0–0.2)

## 2023-01-05 LAB — BASIC METABOLIC PANEL
Anion gap: 14 (ref 5–15)
BUN: 20 mg/dL (ref 8–23)
CO2: 28 mmol/L (ref 22–32)
Calcium: 8 mg/dL — ABNORMAL LOW (ref 8.9–10.3)
Chloride: 97 mmol/L — ABNORMAL LOW (ref 98–111)
Creatinine, Ser: 0.59 mg/dL (ref 0.44–1.00)
GFR, Estimated: >60 mL/min (ref >60)
Glucose, Bld: 171 mg/dL — ABNORMAL HIGH (ref 70–99)
Potassium: 3.4 mmol/L — ABNORMAL LOW (ref 3.5–5.1)
Sodium: 139 mmol/L (ref 135–145)

## 2023-01-05 MED ORDER — METOPROLOL TARTRATE 25 MG PO TABS
50.0000 mg | ORAL_TABLET | Freq: Two times a day (BID) | ORAL | Status: DC
  Administered 2023-01-05 – 2023-01-06 (×4): 50 mg via ORAL
  Filled 2023-01-05 (×5): qty 2

## 2023-01-05 MED ORDER — POTASSIUM CHLORIDE 20 MEQ/15ML (10%) PO SOLN
40.0000 meq | Freq: Once | ORAL | Status: AC
  Administered 2023-01-05: 40 meq via ORAL
  Filled 2023-01-05: qty 30

## 2023-01-05 MED ORDER — HEPARIN BOLUS VIA INFUSION
1700.0000 [IU] | Freq: Once | INTRAVENOUS | Status: AC
  Administered 2023-01-05: 1700 [IU] via INTRAVENOUS
  Filled 2023-01-05: qty 1700

## 2023-01-05 MED ORDER — FUROSEMIDE 10 MG/ML IJ SOLN
20.0000 mg | Freq: Two times a day (BID) | INTRAMUSCULAR | Status: DC
  Administered 2023-01-05 – 2023-01-06 (×2): 20 mg via INTRAVENOUS
  Filled 2023-01-05 (×2): qty 2

## 2023-01-05 NOTE — Progress Notes (Signed)
ANTICOAGULATION CONSULT NOTE - Follow-Up Consult  Pharmacy Consult for heparin Indication: atrial fibrillation  Allergies  Allergen Reactions   Nitrofurantoin Nausea Only and Other (See Comments)    Upsets the stomach and "Allergic," per MAR   Ace Inhibitors Other (See Comments)    "Allergic," per [facility]   Codeine Nausea And Vomiting and Other (See Comments)    "Allergic," per [facility]   Cymbalta [Duloxetine Hcl] Other (See Comments)    "Allergic," per [facility]   Doxycycline Other (See Comments)    "Allergic," per [facility]   Gabapentin Nausea Only and Other (See Comments)    "Allergic," per [facility]   Hctz [Hydrochlorothiazide] Other (See Comments)    "Allergic," per [facility]   Lisinopril Other (See Comments) and Cough    "Allergic," per [facility]   Other Other (See Comments)    Unnamed B/P tablet- made the patient "pass out"   Penicillins Hives   Latex Rash   Sulfa Antibiotics Rash   Tape Rash and Other (See Comments)    Cloth tape preferred- NO latex tape!!    Patient Measurements: Height: '5\' 2"'$  (157.5 cm) Weight: 56.7 kg (125 lb) IBW/kg (Calculated) : 50.1 Heparin Dosing Weight: actual body weight  Vital Signs: Temp: 98 F (36.7 C) (01/22 1900) Temp Source: Oral (01/22 1900) BP: 149/63 (01/22 2200) Pulse Rate: 72 (01/22 2200)  Labs: Recent Labs    12/28/2022 0825 01/04/23 1002 01/04/23 2211 01/05/23 0931 01/05/23 2204  HGB 9.2* 9.0*  --  9.3*  --   HCT 27.5* 27.2*  --  28.8*  --   PLT 593* 598*  --  667*  --   APTT 31  --   --   --   --   LABPROT 15.9*  --   --   --   --   INR 1.3*  --   --   --   --   HEPARINUNFRC  --   --  <0.10* <0.10* 0.23*  CREATININE 0.52 0.52  --  0.59  --      Estimated Creatinine Clearance: 42.9 mL/min (by C-G formula based on SCr of 0.59 mg/dL).   Medical History: Past Medical History:  Diagnosis Date   Anxiety    Aortic atherosclerosis (HCC)    Avascular necrosis of femur, right (HCC)    Balance problems    Chronic kidney disease, stage II (mild)     DDD (degenerative disc disease), lumbosacral    Depression    DJD (degenerative joint disease), lumbar    Elevated cholesterol    GERD (gastroesophageal reflux disease)    HOH (hard of hearing)    Hypertension    Hypertensive renal disease, benign    Hypokalemia    Hyponatremia    Hypothyroidism    IBS (irritable bowel syndrome)    Major depression, recurrent (HCC)    Osteopenia    Osteoporosis    Post-menopausal    Reflux    Subclinical hypothyroidism    Varicose vein of leg    Vitamin D deficiency     Medications:  Medications Prior to Admission  Medication Sig Dispense Refill Last Dose   acetaminophen (TYLENOL) 325 MG tablet Take 1-2 tablets (325-650 mg total) by mouth every 6 (six) hours as needed for mild pain (pain score 1-3 or temp > 100.5). (Patient taking differently: Take 650 mg by mouth 3 (three) times daily.)   01/02/2023 at 2000   ascorbic acid (VITAMIN C) 500 MG tablet Take 500 mg by mouth in the  morning.   01/02/2023 at 0800   aspirin 81 MG tablet Take 1 tablet (81 mg total) by mouth at bedtime. Begin daily aspirin regimen after 28-day course of twice daily aspirin is complete. (Patient taking differently: Take 81 mg by mouth See admin instructions. Starting on 01/01/2023, take 81 mg by mouth at 8 AM and 8 PM for 2 weeks) 30 tablet  01/02/2023 at 2000   B Complex Vitamins (VITAMIN-B COMPLEX) TABS Take 1 tablet by mouth daily with breakfast.   01/02/2023 at 0800   CALCIUM CITRATE+D3 PETITES 200-6.25 MG-MCG TABS Take 2 tablets by mouth daily.   01/02/2023 at 0800   Cholecalciferol (VITAMIN D3) 50 MCG (2000 UT) TABS Take 2,000 Units by mouth every Monday, Wednesday, and Friday.   01/02/2023 at 0800   Coenzyme Q10 (CO Q 10) 100 MG CAPS Take 200 mg by mouth daily.   01/02/2023 at 0800   conjugated estrogens (PREMARIN) vaginal cream INSERT 1/4 APPLICATOR VAGINALLY 2 TIMES A WEEK AS DIRECTED (Patient taking differently: Place 4.19 Applicatorfuls vaginally See admin instructions.  Place 3.79 applicator vaginally once a day only on Tuesdays and Fridays) 30 g 1 01/02/2023 at am   diazepam (VALIUM) 2 MG tablet Take 1 mg by mouth every 6 (six) hours as needed for anxiety.   01/14/2023 at 0119   Flaxseed, Linseed, (FLAX SEEDS) POWD Take 15 g by mouth daily with breakfast.   01/02/2023 at 0800   levofloxacin (LEVAQUIN) 750 MG tablet Take 750 mg by mouth daily.   01/12/2023 at 0200   levothyroxine (SYNTHROID) 25 MCG tablet Take 25-37.5 mcg by mouth See admin instructions. Take 37.5 mcg by mouth before breakfast on Sun/Sat and 25 mcg on Mon/Tues/Wed/Thurs/Fri   01/11/2023 at 0600   loratadine (CLARITIN) 10 MG tablet Take 10 mg by mouth at bedtime.   01/02/2023 at 2000   losartan (COZAAR) 100 MG tablet Take 100 mg by mouth See admin instructions. Take 100 mg by mouth in the morning and notify the MD if the Systolic number is <02 or >409 OR Diastolic number is <73 or >100   01/02/2023 at 0800   methocarbamol (ROBAXIN) 500 MG tablet Take 1 tablet (500 mg total) by mouth every 6 (six) hours as needed for muscle spasms.   01/02/2023 at 0000   Multiple Vitamin (MULTIVITAMIN) tablet Take 1 tablet by mouth daily with breakfast.   01/02/2023 at 0800   NON FORMULARY Take by mouth See admin instructions. MedPass 2.0- As directed 2 times a day for nutritional supplement   01/02/2023 at pm   Omega-3 Fatty Acids (FISH OIL) 1200 MG CAPS Take 1,200 mg by mouth daily.   01/02/2023 at 0800   ondansetron (ZOFRAN) 4 MG tablet Take 1 tablet (4 mg total) by mouth every 6 (six) hours as needed for nausea. 20 tablet 0 unk   polyethylene glycol (MIRALAX / GLYCOLAX) 17 g packet Take 17 g by mouth daily as needed. (Patient taking differently: Take 17 g by mouth daily as needed for mild constipation (to be mixed into 6-8 ounces of juice or water).) 14 each 0 unk   rosuvastatin (CRESTOR) 5 MG tablet Take 5 mg by mouth at bedtime.   01/02/2023 at 2000   sertraline (ZOLOFT) 100 MG tablet Take 200 mg by mouth in the morning.    01/02/2023 at 0800   SIMPLY SALINE 0.9 % AERS Place 1 spray into both nostrils 4 (four) times daily.   01/02/2023 at 2000   acetaminophen (TYLENOL) 325 MG  tablet Take 2 tablets (650 mg total) by mouth 2 (two) times daily. (Patient not taking: Reported on 12/24/2022)   Not Taking   aspirin 81 MG chewable tablet Chew 1 tablet (81 mg total) by mouth 2 (two) times daily for 28 days. (Patient not taking: Reported on 01/01/2023) 56 tablet 0 Not Taking   feeding supplement (ENSURE ENLIVE / ENSURE PLUS) LIQD Take 237 mLs by mouth 2 (two) times daily between meals. (Patient not taking: Reported on 12/27/2022) 237 mL 12 Not Taking    Assessment: 82 yo F with new onset AFib w/ RVR.  Pharmacy consulted to dose heparin.   01/05/23 Heparin level < 0.1 (subtherapeutic) with heparin infusion at 1050 units/hr CBC stable No bleeding or infusion issues noted 2204 HL 0.23 subtherapeutic on 1250 units/hr Per RN no interruptions and no bleeding  Goal of Therapy:  Heparin level 0.3-0.7 units/ml Monitor platelets by anticoagulation protocol: Yes   Plan:  -Increase heparin infusion to 1350 units/hr -Check 8 hr heparin level after rate increased -Daily CBC & heparin level  Dolly Rias RPh 01/05/2023, 10:43 PM

## 2023-01-05 NOTE — Progress Notes (Signed)
ANTICOAGULATION CONSULT NOTE - Follow-Up Consult  Pharmacy Consult for heparin Indication: atrial fibrillation  Allergies  Allergen Reactions   Nitrofurantoin Nausea Only and Other (See Comments)    Upsets the stomach and "Allergic," per MAR   Ace Inhibitors Other (See Comments)    "Allergic," per [facility]   Codeine Nausea And Vomiting and Other (See Comments)    "Allergic," per [facility]   Cymbalta [Duloxetine Hcl] Other (See Comments)    "Allergic," per [facility]   Doxycycline Other (See Comments)    "Allergic," per [facility]   Gabapentin Nausea Only and Other (See Comments)    "Allergic," per [facility]   Hctz [Hydrochlorothiazide] Other (See Comments)    "Allergic," per [facility]   Lisinopril Other (See Comments) and Cough    "Allergic," per [facility]   Other Other (See Comments)    Unnamed B/P tablet- made the patient "pass out"   Penicillins Hives   Latex Rash   Sulfa Antibiotics Rash   Tape Rash and Other (See Comments)    Cloth tape preferred- NO latex tape!!    Patient Measurements: Height: '5\' 2"'$  (157.5 cm) Weight: 56.7 kg (125 lb) IBW/kg (Calculated) : 50.1 Heparin Dosing Weight: actual body weight  Vital Signs: Temp: 98.5 F (36.9 C) (01/22 1200) Temp Source: Axillary (01/22 1200) BP: 153/77 (01/22 1000) Pulse Rate: 63 (01/22 1129)  Labs: Recent Labs    12/28/2022 0825 01/04/23 1002 01/04/23 2211 01/05/23 0931  HGB 9.2* 9.0*  --  9.3*  HCT 27.5* 27.2*  --  28.8*  PLT 593* 598*  --  667*  APTT 31  --   --   --   LABPROT 15.9*  --   --   --   INR 1.3*  --   --   --   HEPARINUNFRC  --   --  <0.10* <0.10*  CREATININE 0.52 0.52  --  0.59     Estimated Creatinine Clearance: 42.9 mL/min (by C-G formula based on SCr of 0.59 mg/dL).   Medical History: Past Medical History:  Diagnosis Date   Anxiety    Aortic atherosclerosis (HCC)    Avascular necrosis of femur, right (HCC)    Balance problems    Chronic kidney disease, stage II (mild)    DDD (degenerative disc disease), lumbosacral     Depression    DJD (degenerative joint disease), lumbar    Elevated cholesterol    GERD (gastroesophageal reflux disease)    HOH (hard of hearing)    Hypertension    Hypertensive renal disease, benign    Hypokalemia    Hyponatremia    Hypothyroidism    IBS (irritable bowel syndrome)    Major depression, recurrent (HCC)    Osteopenia    Osteoporosis    Post-menopausal    Reflux    Subclinical hypothyroidism    Varicose vein of leg    Vitamin D deficiency     Medications:  Medications Prior to Admission  Medication Sig Dispense Refill Last Dose   acetaminophen (TYLENOL) 325 MG tablet Take 1-2 tablets (325-650 mg total) by mouth every 6 (six) hours as needed for mild pain (pain score 1-3 or temp > 100.5). (Patient taking differently: Take 650 mg by mouth 3 (three) times daily.)   01/02/2023 at 2000   ascorbic acid (VITAMIN C) 500 MG tablet Take 500 mg by mouth in the morning.   01/02/2023 at 0800   aspirin 81 MG tablet Take 1 tablet (81 mg total) by mouth at bedtime. Begin daily  aspirin regimen after 28-day course of twice daily aspirin is complete. (Patient taking differently: Take 81 mg by mouth See admin instructions. Starting on 01/01/2023, take 81 mg by mouth at 8 AM and 8 PM for 2 weeks) 30 tablet  01/02/2023 at 2000   B Complex Vitamins (VITAMIN-B COMPLEX) TABS Take 1 tablet by mouth daily with breakfast.   01/02/2023 at 0800   CALCIUM CITRATE+D3 PETITES 200-6.25 MG-MCG TABS Take 2 tablets by mouth daily.   01/02/2023 at 0800   Cholecalciferol (VITAMIN D3) 50 MCG (2000 UT) TABS Take 2,000 Units by mouth every Monday, Wednesday, and Friday.   01/02/2023 at 0800   Coenzyme Q10 (CO Q 10) 100 MG CAPS Take 200 mg by mouth daily.   01/02/2023 at 0800   conjugated estrogens (PREMARIN) vaginal cream INSERT 1/4 APPLICATOR VAGINALLY 2 TIMES A WEEK AS DIRECTED (Patient taking differently: Place 4.09 Applicatorfuls vaginally See admin instructions. Place 8.11 applicator vaginally once a day only on  Tuesdays and Fridays) 30 g 1 01/02/2023 at am   diazepam (VALIUM) 2 MG tablet Take 1 mg by mouth every 6 (six) hours as needed for anxiety.   12/29/2022 at 0119   Flaxseed, Linseed, (FLAX SEEDS) POWD Take 15 g by mouth daily with breakfast.   01/02/2023 at 0800   levofloxacin (LEVAQUIN) 750 MG tablet Take 750 mg by mouth daily.   12/18/2022 at 0200   levothyroxine (SYNTHROID) 25 MCG tablet Take 25-37.5 mcg by mouth See admin instructions. Take 37.5 mcg by mouth before breakfast on Sun/Sat and 25 mcg on Mon/Tues/Wed/Thurs/Fri   01/09/2023 at 0600   loratadine (CLARITIN) 10 MG tablet Take 10 mg by mouth at bedtime.   01/02/2023 at 2000   losartan (COZAAR) 100 MG tablet Take 100 mg by mouth See admin instructions. Take 100 mg by mouth in the morning and notify the MD if the Systolic number is <91 or >478 OR Diastolic number is <29 or >100   01/02/2023 at 0800   methocarbamol (ROBAXIN) 500 MG tablet Take 1 tablet (500 mg total) by mouth every 6 (six) hours as needed for muscle spasms.   01/02/2023 at 0000   Multiple Vitamin (MULTIVITAMIN) tablet Take 1 tablet by mouth daily with breakfast.   01/02/2023 at 0800   NON FORMULARY Take by mouth See admin instructions. MedPass 2.0- As directed 2 times a day for nutritional supplement   01/02/2023 at pm   Omega-3 Fatty Acids (FISH OIL) 1200 MG CAPS Take 1,200 mg by mouth daily.   01/02/2023 at 0800   ondansetron (ZOFRAN) 4 MG tablet Take 1 tablet (4 mg total) by mouth every 6 (six) hours as needed for nausea. 20 tablet 0 unk   polyethylene glycol (MIRALAX / GLYCOLAX) 17 g packet Take 17 g by mouth daily as needed. (Patient taking differently: Take 17 g by mouth daily as needed for mild constipation (to be mixed into 6-8 ounces of juice or water).) 14 each 0 unk   rosuvastatin (CRESTOR) 5 MG tablet Take 5 mg by mouth at bedtime.   01/02/2023 at 2000   sertraline (ZOLOFT) 100 MG tablet Take 200 mg by mouth in the morning.   01/02/2023 at 0800   SIMPLY SALINE 0.9 % AERS Place 1  spray into both nostrils 4 (four) times daily.   01/02/2023 at 2000   acetaminophen (TYLENOL) 325 MG tablet Take 2 tablets (650 mg total) by mouth 2 (two) times daily. (Patient not taking: Reported on 12/28/2022)   Not Taking  aspirin 81 MG chewable tablet Chew 1 tablet (81 mg total) by mouth 2 (two) times daily for 28 days. (Patient not taking: Reported on 01/09/2023) 56 tablet 0 Not Taking   feeding supplement (ENSURE ENLIVE / ENSURE PLUS) LIQD Take 237 mLs by mouth 2 (two) times daily between meals. (Patient not taking: Reported on 12/28/2022) 237 mL 12 Not Taking    Assessment: 82 yo F with new onset AFib w/ RVR.  Pharmacy consulted to dose heparin.   01/05/23 Heparin level < 0.1 (subtherapeutic) with heparin infusion at 1050 units/hr CBC stable No bleeding or infusion issues noted  Goal of Therapy:  Heparin level 0.3-0.7 units/ml Monitor platelets by anticoagulation protocol: Yes   Plan:  -[place] heparin 1700 units -Increase heparin infusion to 1250 units/hr -Check 8 hr heparin level after rate increased -Daily CBC & heparin level  Tawnya Crook, PharmD, BCPS Clinical Pharmacist 01/05/2023 1:26 PM

## 2023-01-05 NOTE — Progress Notes (Signed)
[facility]  PROGRESS NOTE  Brittany Douglas GUR:[id number] DOB: [birth year] DOA: 01/09/2023 PCP: [name], MD   Brief HPI:   82 year old female with medical history of hypothyroidism, hypertension, anxiety/depression, hyperlipidemia presented with shortness of breath.  She was recently discharged to SNF after a stay for right hip fracture and pneumonia.  She was sent home on cefdinir and finished antibiotics course.  She started becoming short of breath.  At the facility she was short of breath and found to have pneumonia on chest x-ray.  She was placed on 4 L nasal cannula and transferred to ED for evaluation. Patient was started on vancomycin and cefepime.    Subjective   Patient seen and examined, heart rate is better controlled, blood pressure still elevated.  She was put on BiPAP this morning.   Assessment/Plan:    Acute hypoxemic respiratory failure -Multifactorial, chest x-ray showed new bilateral patchy airspace process likely multifocal infection -Also BNP elevated to 765; echocardiogram from 12/25/2022 showed grade 2 diastolic dysfunction -Patient has been intermittently put on BiPAP; patient is on 4 L of oxygen -Patient started on vancomycin and cefepime; vancomycin has been stopped as MRSA is negative.  Continue cefepime -Follow blood culture results; procalcitonin 0.12 -WBC is up to 20,000 -Influenza A, influenza B, RSV, SARS-CoV-2 RT-PCR negative -She diuresed well with IV Lasix yesterday.  Will start Lasix 20 mg IV every 12 hours.  Atrial fibrillation with RVR -Patient went into A-fib with RVR yesterday -Started on IV Cardizem gtt. -Also started on heparin GGT; CHA2DS2VASc score was at least 4  Hypothyroidism -Continue Synthroid  Hypertension -Increase dose of metoprolol to 50 mg p.o. twice daily  Hyperlipidemia -Continue rosuvastatin  Normocytic anemia -At baseline   Medications     aspirin  81 mg Oral BID   Chlorhexidine Gluconate  Cloth  6 each Topical Daily   furosemide  20 mg Intravenous Q12H   ipratropium  0.5 mg Nebulization Q6H   levalbuterol  0.63 mg Nebulization Q6H   levothyroxine  25 mcg Oral Once per day on Mon Tue Wed Thu Fri   [START ON 01/10/2023] levothyroxine  37.5 mcg Oral Once per day on Sun Sat   metoprolol tartrate  50 mg Oral BID   potassium chloride  40 mEq Oral Once   potassium chloride  20 mEq Oral Once   rosuvastatin  5 mg Oral QHS     Data Reviewed:   CBG:  No results for input(s): "GLUCAP" in the last 168 hours.  SpO2: 96 % O2 Flow Rate (L/min): 5 L/min FiO2 (%): 35 %    Vitals:   01/05/23 0700 01/05/23 0800 01/05/23 1000 01/05/23 1129  BP: (!) 194/74 (!) 174/50 (!) 153/77   Pulse: 87 89 84 63  Resp: (!) 35 (!) 29 (!) 34 (!) 33  Temp:      TempSrc:      SpO2: 91% 93% 96% 96%  Weight:      Height:          Data Reviewed:  Basic Metabolic Panel: Recent Labs  Lab 01/04/2023 0825 01/04/23 1002 01/05/23 0931  NA 134* 137 139  K 3.7 3.1* 3.4*  CL 97* 98 97*  CO2 '26 27 28  '$ GLUCOSE 163* 121* 171*  BUN '9 14 20  '$ CREATININE 0.52 0.52 0.59  CALCIUM 8.3* 8.0* 8.0*    CBC: Recent Labs  Lab 12/21/2022 0825 01/04/23 1002 01/05/23 0931  WBC 23.0* 17.8* 20.4*  NEUTROABS 21.2*  --   --  HGB 9.2* 9.0* 9.3*  HCT 27.5* 27.2* 28.8*  MCV 98.9 98.6 99.3  PLT 593* 598* 667*    LFT Recent Labs  Lab 01/07/2023 0825 01/04/23 1002  AST 29 40  ALT 24 33  ALKPHOS 90 130*  BILITOT 0.5 0.6  PROT 6.7 6.5  ALBUMIN 2.7* 2.6*     Antibiotics: Anti-infectives (From admission, onward)    Start     Dose/Rate Route Frequency Ordered Stop   01/04/23 1200  vancomycin (VANCOREADY) IVPB 750 mg/150 mL  Status:  Discontinued        750 mg 150 mL/hr over 60 Minutes Intravenous Every 24 hours 12/16/2022 1155 01/04/23 1314   12/27/2022 1130  ceFEPIme (MAXIPIME) 2 g in sodium chloride 0.9 % 100 mL IVPB        2 g 200 mL/hr over 30 Minutes Intravenous Every 12 hours 12/15/2022 1116      01/06/2023 1130  vancomycin (VANCOREADY) IVPB 1250 mg/250 mL        1,250 mg 166.7 mL/hr over 90 Minutes Intravenous  Once 01/02/2023 1116 12/16/2022 1356   01/01/2023 0800  cefTRIAXone (ROCEPHIN) 2 g in sodium chloride 0.9 % 100 mL IVPB  Status:  Discontinued        2 g 200 mL/hr over 30 Minutes Intravenous Every 24 hours 01/09/2023 0759 12/28/2022 1101   12/17/2022 0800  azithromycin (ZITHROMAX) 500 mg in sodium chloride 0.9 % 250 mL IVPB  Status:  Discontinued        500 mg 250 mL/hr over 60 Minutes Intravenous Every 24 hours 01/02/2023 0759 01/14/2023 1101        DVT prophylaxis: SCDs, patient also has been on aspirin 81 mg p.o. twice daily since 12/25/2022 for 28 days  Code Status: DNR  Family Communication: Discussed with patient's daughter at bedside   CONSULTS    Objective    Physical Examination:   Appears in no acute distress Lungs, bilateral crackles auscultated Abdomen is soft, nontender, no organomegaly No edema in the lower extremities   Status is: Inpatient:          Oswald Hillock   [facility] If 7PM-7AM, please contact night-coverage at [email or web address], Office  [phone]   01/05/2023, 11:52 AM  LOS: 2 days

## 2023-01-06 ENCOUNTER — Inpatient Hospital Stay (HOSPITAL_COMMUNITY): Payer: PPO

## 2023-01-06 ENCOUNTER — Other Ambulatory Visit: Payer: Self-pay

## 2023-01-06 DIAGNOSIS — E039 Hypothyroidism, unspecified: Secondary | ICD-10-CM | POA: Diagnosis not present

## 2023-01-06 DIAGNOSIS — I4719 Other supraventricular tachycardia: Secondary | ICD-10-CM

## 2023-01-06 DIAGNOSIS — J9601 Acute respiratory failure with hypoxia: Secondary | ICD-10-CM | POA: Diagnosis not present

## 2023-01-06 DIAGNOSIS — J189 Pneumonia, unspecified organism: Secondary | ICD-10-CM | POA: Diagnosis not present

## 2023-01-06 DIAGNOSIS — I10 Essential (primary) hypertension: Secondary | ICD-10-CM | POA: Diagnosis not present

## 2023-01-06 LAB — BASIC METABOLIC PANEL
Anion gap: 11 (ref 5–15)
BUN: 24 mg/dL — ABNORMAL HIGH (ref 8–23)
CO2: 32 mmol/L (ref 22–32)
Calcium: 8 mg/dL — ABNORMAL LOW (ref 8.9–10.3)
Chloride: 96 mmol/L — ABNORMAL LOW (ref 98–111)
Creatinine, Ser: 0.59 mg/dL (ref 0.44–1.00)
GFR, Estimated: >60 mL/min (ref >60)
Glucose, Bld: 203 mg/dL — ABNORMAL HIGH (ref 70–99)
Potassium: 3.5 mmol/L (ref 3.5–5.1)
Sodium: 139 mmol/L (ref 135–145)

## 2023-01-06 LAB — RESPIRATORY PANEL BY PCR

## 2023-01-06 LAB — URINE CULTURE: Culture: 600 — AB

## 2023-01-06 LAB — HEPARIN LEVEL (UNFRACTIONATED)
Heparin Unfractionated: 0.29 IU/mL — ABNORMAL LOW (ref 0.30–0.70)
Heparin Unfractionated: 0.35 IU/mL (ref 0.30–0.70)

## 2023-01-06 LAB — CBC
HCT: 28.3 % — ABNORMAL LOW (ref 36.0–46.0)
Hemoglobin: 9.2 g/dL — ABNORMAL LOW (ref 12.0–15.0)
MCH: 31.9 pg (ref 26.0–34.0)
MCHC: 32.5 g/dL (ref 30.0–36.0)
MCV: 98.3 fL (ref 80.0–100.0)
Platelets: 642 10*3/uL — ABNORMAL HIGH (ref 150–400)
RBC: 2.88 MIL/uL — ABNORMAL LOW (ref 3.87–5.11)
RDW: 13.5 % (ref 11.5–15.5)
WBC: 20.9 10*3/uL — ABNORMAL HIGH (ref 4.0–10.5)
nRBC: 0 % (ref 0.0–0.2)

## 2023-01-06 LAB — TSH: TSH: 2.872 u[IU]/mL (ref 0.350–4.500)

## 2023-01-06 LAB — SEDIMENTATION RATE: Sed Rate: 135 mm/hr — ABNORMAL HIGH (ref 0–22)

## 2023-01-06 LAB — BRAIN NATRIURETIC PEPTIDE: B Natriuretic Peptide: 768.5 pg/mL — ABNORMAL HIGH (ref 0.0–100.0)

## 2023-01-06 MED ORDER — ORAL CARE MOUTH RINSE: 15.0000 mL | OROMUCOSAL | Status: DC | PRN

## 2023-01-06 MED ORDER — METHYLPREDNISOLONE SODIUM SUCC 40 MG IJ SOLR
40.0000 mg | Freq: Two times a day (BID) | INTRAMUSCULAR | Status: DC
  Administered 2023-01-06 – 2023-01-07 (×4): 40 mg via INTRAVENOUS
  Filled 2023-01-06 (×4): qty 1

## 2023-01-06 MED ORDER — FUROSEMIDE 10 MG/ML IJ SOLN: 40.0000 mg | Freq: Two times a day (BID) | INTRAMUSCULAR | Status: DC

## 2023-01-06 MED ORDER — POTASSIUM CHLORIDE 10 MEQ/100ML IV SOLN
10.0000 meq | INTRAVENOUS | Status: AC
  Administered 2023-01-06 (×4): 10 meq via INTRAVENOUS
  Filled 2023-01-06 (×4): qty 100

## 2023-01-06 MED ORDER — SODIUM CHLORIDE 0.9 % IV SOLN
500.0000 mg | INTRAVENOUS | Status: DC
  Administered 2023-01-06 – 2023-01-07 (×2): 500 mg via INTRAVENOUS
  Filled 2023-01-06 (×2): qty 5

## 2023-01-06 MED ORDER — HYDRALAZINE HCL 25 MG PO TABS
25.0000 mg | ORAL_TABLET | Freq: Four times a day (QID) | ORAL | Status: DC | PRN
  Administered 2023-01-06: 25 mg via ORAL
  Filled 2023-01-06: qty 1

## 2023-01-06 MED ORDER — HYDRALAZINE HCL 20 MG/ML IJ SOLN
10.0000 mg | INTRAMUSCULAR | Status: DC | PRN
  Administered 2023-01-07: 10 mg via INTRAVENOUS
  Filled 2023-01-06 (×3): qty 1

## 2023-01-06 MED ORDER — ORAL CARE MOUTH RINSE
15.0000 mL | OROMUCOSAL | Status: DC
  Administered 2023-01-06 – 2023-01-08 (×9): 15 mL via OROMUCOSAL

## 2023-01-06 MED ORDER — DEXMEDETOMIDINE HCL IN NACL 200 MCG/50ML IV SOLN
0.0000 ug/kg/h | INTRAVENOUS | Status: DC
  Administered 2023-01-06: 0.5 ug/kg/h via INTRAVENOUS
  Administered 2023-01-06 – 2023-01-07 (×2): 0.4 ug/kg/h via INTRAVENOUS
  Administered 2023-01-07 – 2023-01-08 (×4): 0.6 ug/kg/h via INTRAVENOUS
  Administered 2023-01-08: 0.3 ug/kg/h via INTRAVENOUS
  Filled 2023-01-06 (×8): qty 50

## 2023-01-06 MED ORDER — ALBUMIN HUMAN 25 % IV SOLN: 25.0000 g | Freq: Four times a day (QID) | INTRAVENOUS | Status: DC

## 2023-01-06 MED ORDER — FUROSEMIDE 10 MG/ML IJ SOLN
40.0000 mg | Freq: Three times a day (TID) | INTRAMUSCULAR | Status: DC
  Administered 2023-01-06 – 2023-01-08 (×6): 40 mg via INTRAVENOUS
  Filled 2023-01-06 (×6): qty 4

## 2023-01-06 MED ORDER — POTASSIUM CHLORIDE CRYS ER 20 MEQ PO TBCR: 40.0000 meq | EXTENDED_RELEASE_TABLET | Freq: Two times a day (BID) | ORAL | Status: DC

## 2023-01-06 MED ORDER — ALBUMIN HUMAN 25 % IV SOLN
25.0000 g | Freq: Four times a day (QID) | INTRAVENOUS | Status: AC
  Administered 2023-01-06 – 2023-01-07 (×4): 25 g via INTRAVENOUS
  Filled 2023-01-06 (×4): qty 100

## 2023-01-06 NOTE — TOC Initial Note (Signed)
Transition of Care [facility]) - Initial/Assessment Note    Patient Details  Name: Brittany Douglas MRN: [id number] Date of Birth: March 20, 1941  [facility]) CM/SW Contact:    Roseanne Kaufman, RN Phone Number: 01/06/2023, 6:54 PM  Clinical Narrative:     Patient from [facility] SNF. TOC will follow for discharge needs.              Expected Discharge Plan: Skilled Nursing Facility Barriers to Discharge: Continued Medical Work up   Patient Goals and CMS Choice            Expected Discharge Plan and Services                                              Prior Living Arrangements/Services                       Activities of Daily Living      Permission Sought/Granted                  Emotional Assessment              Admission diagnosis:  Community acquired pneumonia, unspecified laterality [J18.9] Acute hypoxic respiratory failure ([place]) [J96.01] Patient Active Problem List   Diagnosis Date Noted   Acute hypoxic respiratory failure ([place]) 12/20/2022   Normocytic anemia 12/15/2022   Hyperglycemia 01/01/2023   Sepsis ([place]) 01/02/2023   Multifocal pneumonia 12/25/2022   Acute respiratory failure with hypoxia ([place]) 12/24/2022   Hypokalemia 12/24/2022   Generalized anxiety disorder 12/24/2022   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   Fracture of femoral neck, right, closed ([place]) 12/20/2022   Depression 12/20/2022   Hypothyroidism 12/20/2022   Fall at home, initial encounter 12/20/2022   Elevated cholesterol    Essential hypertension    Anxiety    IBS (irritable bowel syndrome)    Reflux    PCP:  [name], MD Pharmacy:   [place], [place] - [place] [address] [place] [address] Phone: [phone] Fax: [phone]     Social Determinants of Health ([place]) Social History: [place]: Low Risk  (12/20/2022)  Tobacco Use: Low  Risk  (01/06/2023)   SDOH Interventions:     Readmission Risk Interventions    12/22/2022    3:33 PM  Readmission Risk Prevention Plan  Post Dischage Appt Complete  Medication Screening Complete  Transportation Screening Complete

## 2023-01-06 NOTE — Consult Note (Signed)
NAME:  Brittany Douglas, MRN:  [id number], DOB:  08/16/1941, LOS: 3 ADMISSION DATE:  01/13/2023, CONSULTATION DATE:  01/06/23 REFERRING MD:  Darrick Meigs, CHIEF COMPLAINT:  worsening pneumonia   History of Present Illness:  Brittany Douglas is a 82 y.o. F with PMH of HTN, HL, DJD, CKD stage II with recent admission 1/6-1/11 after a fall and subsequent R hip fracture s/p arthroplasty on 1/7.  She was diagnosed with pneumonia during that admission and discharge with Azithromycin and Omnicef for a total of 5 days to SNF.   She was feeling improved until a few days prior to admission when she had worsening cough and shortness of breath so presented to  the ED on 1/20.  CTA was negative for PE, but showed increasing progressive ground glass opacities and consolidation.  She was admitted and initially treated with Vanc/Cefepime which was narrowed to Cefepime after MRSA was negative.  Since admission her CXR was worsened and she has required Bipap along with developing Afib RVR.   PCCM consulted in this setting, she is DNR.  Pt denies any fever or chills, she is coughing up some sputum.   No chest pain or history of smoking, she worked as an Optometrist.   Pertinent  Medical History   has a past medical history of Anxiety, Aortic atherosclerosis ([place]), Avascular necrosis of femur, right ([place]), Balance problems, Chronic kidney disease, stage II (mild), DDD (degenerative disc disease), lumbosacral, Depression, DJD (degenerative joint disease), lumbar, Elevated cholesterol, GERD (gastroesophageal reflux disease), HOH (hard of hearing), Hypertension, Hypertensive renal disease, benign, Hypokalemia, Hyponatremia, Hypothyroidism, IBS (irritable bowel syndrome), Major depression, recurrent ([place]), Osteopenia, Osteoporosis, Post-menopausal, Reflux, Subclinical hypothyroidism, Varicose vein of leg, and Vitamin D deficiency.   Significant Hospital Events: Including procedures, antibiotic start and stop dates in addition  to other pertinent events   1/20 admit to [facility] with pneumonia 1/23 PCCM consult due to worsening dyspnea  Interim History / Subjective:  Pt initially eating lunch without significant distress on 6L, however after mouth care her dyspnea increased and was placed back on Bipap  Objective   Blood pressure (!) 180/71, pulse 88, temperature 98.5 F (36.9 C), temperature source Axillary, resp. rate (!) 33, height '5\' 2"'$  (1.575 m), weight 56.7 kg, SpO2 97 %.    FiO2 (%):  [35 %-45 %] 45 %   Intake/Output Summary (Last 24 hours) at 01/06/2023 0959 Last data filed at 01/06/2023 0700 Gross per 24 hour  Intake 1162.73 ml  Output 1750 ml  Net -587.27 ml   Filed Weights   12/17/2022 0753  Weight: 56.7 kg    General:  frail, elderly F resting in bed with moderate dyspnea HEENT: MM pink/moist Neuro: awake, alert, oriented and following commands  CV: s1s2 rrr, no m/r/g PULM:  crackles bilaterally in upper and lower lung fields with mild tachypnea and accessory muscle use GI: soft, bsx4 active  Extremities: warm/dry, no edema  Skin: no rashes or lesions   Resolved Hospital Problem list     Assessment & Plan:     Acute Hypoxic Respiratory Failure secondary to multifocal pneumonia HFpEF  Anxiety HCAP in the setting of recent admission and worse after outpatient abx Covid, RSV, flu negative Worsening on Cefepime, requiring bipap Procal 0.12  Has been diuresed, currently -2.2L -check RVP, urine strep and legionella -sputum culture pending -start solumedrol '40mg'$  for severe pneumonia, add atypical coverage with azithromycin -start precedex gtt to assist with tolerating Bipap mask -Pt is DNR, consult palliative care to assist with  further Benjamin discussions   Atrial Fibrillation Apparently new this admission Echo with preserved EF, currently hypertensive -continue cardizem and heparin per primary team -check TSH  *Rest of plan per primary team.  Thank you for this consult, PCCM will  continue to follow with you   Best Practice (right click and "Reselect all SmartList Selections" daily)   Diet/type: clear liquids DVT prophylaxis: systemic heparin GI prophylaxis: N/A Lines: N/A Foley:  N/A Code Status:  DNR Last date of multidisciplinary goals of care discussion [pending, son and patient updated at the bedside]  Labs   CBC: Recent Labs  Lab 01/01/2023 0825 01/04/23 1002 01/05/23 0931 01/06/23 0721  WBC 23.0* 17.8* 20.4* 20.9*  NEUTROABS 21.2*  --   --   --   HGB 9.2* 9.0* 9.3* 9.2*  HCT 27.5* 27.2* 28.8* 28.3*  MCV 98.9 98.6 99.3 98.3  PLT 593* 598* 667* 642*    Basic Metabolic Panel: Recent Labs  Lab 01/10/2023 0825 01/04/23 1002 01/05/23 0931  NA 134* 137 139  K 3.7 3.1* 3.4*  CL 97* 98 97*  CO2 '26 27 28  '$ GLUCOSE 163* 121* 171*  BUN '9 14 20  '$ CREATININE 0.52 0.52 0.59  CALCIUM 8.3* 8.0* 8.0*   GFR: Estimated Creatinine Clearance: 42.9 mL/min (by C-G formula based on SCr of 0.59 mg/dL). Recent Labs  Lab 12/24/2022 0825 01/11/2023 1154 01/04/23 1002 01/05/23 0931 01/06/23 0721  WBC 23.0*  --  17.8* 20.4* 20.9*  LATICACIDVEN 1.3 1.1  --   --   --     Liver Function Tests: Recent Labs  Lab 12/28/2022 0825 01/04/23 1002  AST 29 40  ALT 24 33  ALKPHOS 90 130*  BILITOT 0.5 0.6  PROT 6.7 6.5  ALBUMIN 2.7* 2.6*   No results for input(s): "LIPASE", "AMYLASE" in the last 168 hours. No results for input(s): "AMMONIA" in the last 168 hours.  ABG    Component Value Date/Time   HCO3 28.9 (H) 01/02/2023 0825   O2SAT 87.1 01/04/2023 0825     Coagulation Profile: Recent Labs  Lab 01/06/2023 0825  INR 1.3*    Cardiac Enzymes: No results for input(s): "CKTOTAL", "CKMB", "CKMBINDEX", "TROPONINI" in the last 168 hours.  HbA1C: Hgb A1c MFr Bld  Date/Time Value Ref Range Status  12/22/2022 08:42 AM 5.7 (H) 4.8 - 5.6 % Final    Comment:    (NOTE) Pre diabetes:          5.7%-6.4%  Diabetes:              >6.4%  Glycemic control for    <7.0% adults with diabetes     CBG: No results for input(s): "GLUCAP" in the last 168 hours.  Review of Systems:   Please see the history of present illness. All other systems reviewed and are negative    Past Medical History:  She,  has a past medical history of Anxiety, Aortic atherosclerosis ([place]), Avascular necrosis of femur, right ([place]), Balance problems, Chronic kidney disease, stage II (mild), DDD (degenerative disc disease), lumbosacral, Depression, DJD (degenerative joint disease), lumbar, Elevated cholesterol, GERD (gastroesophageal reflux disease), HOH (hard of hearing), Hypertension, Hypertensive renal disease, benign, Hypokalemia, Hyponatremia, Hypothyroidism, IBS (irritable bowel syndrome), Major depression, recurrent ([place]), Osteopenia, Osteoporosis, Post-menopausal, Reflux, Subclinical hypothyroidism, Varicose vein of leg, and Vitamin D deficiency.   Surgical History:   Past Surgical History:  Procedure Laterality Date   BREAST EXCISIONAL BIOPSY Bilateral    BREAST SURGERY     Benign breast lumps  CATARACT EXTRACTION     CHOLECYSTECTOMY     FOOT SURGERY     TOTAL HIP ARTHROPLASTY Right 12/21/2022   Procedure: TOTAL HIP ARTHROPLASTY ANTERIOR APPROACH;  Surgeon: Paralee Cancel, MD;  Location: [facility]S;  Service: Orthopedics;  Laterality: Right;   VAGINAL HYSTERECTOMY     A & P repair     Social History:   reports that she has never smoked. She has never used smokeless tobacco. She reports that she does not drink alcohol and does not use drugs.   Family History:  Her family history includes Breast cancer in her maternal aunt; Colon polyps in her daughter and son; Diabetes in her maternal grandmother; Heart attack in her brother and paternal grandmother; Hyperlipidemia in her sister; Hypertension in her mother; Other in her grandson; Stroke in her brother, father, and mother; Thyroid disease in her sister; Uterine cancer in her mother.   Allergies Allergies  Allergen  Reactions   Nitrofurantoin Nausea Only and Other (See Comments)    Upsets the stomach and "Allergic," per [facility]   Ace Inhibitors Other (See Comments)    "Allergic," per [facility]   Codeine Nausea And Vomiting and Other (See Comments)    "Allergic," per [facility]   Cymbalta [Duloxetine Hcl] Other (See Comments)    "Allergic," per [facility]   Doxycycline Other (See Comments)    "Allergic," per [facility]   Gabapentin Nausea Only and Other (See Comments)    "Allergic," per [facility]   Hctz [Hydrochlorothiazide] Other (See Comments)    "Allergic," per MAR   Lisinopril Other (See Comments) and Cough    "Allergic," per [facility]   Other Other (See Comments)    Unnamed B/P tablet- made the patient "pass out"   Penicillins Hives   Latex Rash   Sulfa Antibiotics Rash   Tape Rash and Other (See Comments)    Cloth tape preferred- NO latex tape!!     Home Medications  Prior to Admission medications   Medication Sig Start Date End Date Taking? Authorizing Provider  acetaminophen (TYLENOL) 325 MG tablet Take 1-2 tablets (325-650 mg total) by mouth every 6 (six) hours as needed for mild pain (pain score 1-3 or temp > 100.5). Patient taking differently: Take 650 mg by mouth 3 (three) times daily. 12/25/22  Yes Shalhoub, Sherryll Burger, MD  ascorbic acid (VITAMIN C) 500 MG tablet Take 500 mg by mouth in the morning.   Yes [provider]  aspirin 81 MG tablet Take 1 tablet (81 mg total) by mouth at bedtime. Begin daily aspirin regimen after 28-day course of twice daily aspirin is complete. Patient taking differently: Take 81 mg by mouth See admin instructions. Starting on 01/01/2023, take 81 mg by mouth at 8 AM and 8 PM for 2 weeks 12/25/22  Yes Shalhoub, Sherryll Burger, MD  B Complex Vitamins (VITAMIN-B COMPLEX) TABS Take 1 tablet by mouth daily with breakfast.   Yes [provider]  CALCIUM CITRATE+D3 PETITES 200-6.25 MG-MCG TABS Take 2 tablets by mouth daily.   Yes [provider]  Cholecalciferol (VITAMIN D3) 50 MCG  (2000 UT) TABS Take 2,000 Units by mouth every Monday, Wednesday, and Friday.   Yes [provider]  Coenzyme Q10 (CO Q 10) 100 MG CAPS Take 200 mg by mouth daily.   Yes [provider]  conjugated estrogens (PREMARIN) vaginal cream INSERT 1/4 APPLICATOR VAGINALLY 2 TIMES A WEEK AS DIRECTED Patient taking differently: Place 9.47 Applicatorfuls vaginally See admin instructions. Place 0.96 applicator vaginally once a  day only on Tuesdays and Fridays 11/25/22  Yes Marny Lowenstein A, NP  diazepam (VALIUM) 2 MG tablet Take 1 mg by mouth every 6 (six) hours as needed for anxiety.   Yes [provider]  Flaxseed, Linseed, (FLAX SEEDS) POWD Take 15 g by mouth daily with breakfast.   Yes [provider]  levofloxacin (LEVAQUIN) 750 MG tablet Take 750 mg by mouth daily. 01/07/2023 01/10/23 Yes [provider]  levothyroxine (SYNTHROID) 25 MCG tablet Take 25-37.5 mcg by mouth See admin instructions. Take 37.5 mcg by mouth before breakfast on Sun/Sat and 25 mcg on Mon/Tues/Wed/Thurs/Fri 05/22/22  Yes [provider]  loratadine (CLARITIN) 10 MG tablet Take 10 mg by mouth at bedtime.   Yes [provider]  losartan (COZAAR) 100 MG tablet Take 100 mg by mouth See admin instructions. Take 100 mg by mouth in the morning and notify the MD if the Systolic number is <64 or >680 OR Diastolic number is <32 or >100   Yes [provider]  methocarbamol (ROBAXIN) 500 MG tablet Take 1 tablet (500 mg total) by mouth every 6 (six) hours as needed for muscle spasms. 12/25/22  Yes Shalhoub, Sherryll Burger, MD  Multiple Vitamin (MULTIVITAMIN) tablet Take 1 tablet by mouth daily with breakfast.   Yes [provider]  NON FORMULARY Take by mouth See admin instructions. MedPass 2.0- As directed 2 times a day for nutritional supplement   Yes [provider]  Omega-3 Fatty Acids (FISH OIL) 1200 MG CAPS Take 1,200 mg by mouth daily.   Yes [provider]  ondansetron (ZOFRAN) 4 MG tablet Take 1 tablet (4 mg total) by mouth every 6 (six) hours as needed for nausea. 12/25/22  Yes Shalhoub, Sherryll Burger, MD  polyethylene glycol (MIRALAX / GLYCOLAX) 17 g packet Take 17 g by mouth daily as needed. Patient taking differently: Take 17 g by mouth daily as needed for mild constipation (to be mixed into 6-8 ounces of juice or water). 12/25/22  Yes Shalhoub, Sherryll Burger, MD  rosuvastatin (CRESTOR) 5 MG tablet Take 5 mg by mouth at bedtime.   Yes [provider]  sertraline (ZOLOFT) 100 MG tablet Take 200 mg by mouth in the morning.   Yes [provider]  SIMPLY SALINE 0.9 % AERS Place 1 spray into both nostrils 4 (four) times daily.   Yes [provider]  acetaminophen (TYLENOL) 325 MG tablet Take 2 tablets (650 mg total) by mouth 2 (two) times daily. Patient not taking: Reported on 01/11/2023 12/25/22   Vernelle Emerald, MD  aspirin 81 MG chewable tablet Chew 1 tablet (81 mg total) by mouth 2 (two) times daily for 28 days. Patient not taking: Reported on 01/05/2023 12/25/22 01/22/23  Vernelle Emerald, MD  feeding supplement (ENSURE ENLIVE / ENSURE PLUS) LIQD Take 237 mLs by mouth 2 (two) times daily between meals. Patient not taking: Reported on 01/10/2023 12/25/22   Vernelle Emerald, MD     Critical care time:  40 minutes     CRITICAL CARE Performed by: Otilio Carpen Nayeli Calvert   Total critical care time: 40 minutes  Critical care time was exclusive of separately billable procedures and treating other patients.  Critical care was necessary to treat or prevent imminent or life-threatening deterioration.  Critical care was time spent personally by me on the following activities: development of treatment plan with patient and/or surrogate as well as nursing, discussions with consultants, evaluation of patient's response to treatment, examination  of patient, obtaining history from patient or surrogate, ordering and performing treatments and  interventions, ordering and review of laboratory studies, ordering and review of radiographic studies, pulse oximetry and re-evaluation of patient's condition.   Otilio Carpen Franchot Pollitt, PA-C [facility] See Amion for pager If no response to pager , please call [phone] until 7pm After 7:00 pm call Elink  [phone]?[place]

## 2023-01-06 NOTE — Plan of Care (Signed)
Discussed with patient in front of family, plan of care for the evening, pain management and encouraged to eat andwear Bipap tonight with some teach back displayed.  Problem: Education: Goal: Knowledge of the prescribed therapeutic regimen will improve Outcome: Progressing

## 2023-01-06 NOTE — Consult Note (Addendum)
Cardiology Consultation   Patient ID: ARLYNE BRANDES MRN: [id number]; DOB: 20-Jun-1941  Admit date: 12/29/2022 Date of Consult: 01/06/2023  PCP:  [name], [place] [facility] Cardiologist:  Dr Irish Lack   Patient Profile:   Brittany Douglas is a 82 y.o. female with a hx of HTN, HLD, anxiety, hypothyrodism,  who is being seen 01/06/2023 for the evaluation of A fib at the request of Dr Darrick Meigs.  History of Present Illness:   Brittany Douglas with above PMH presented to ER 01/12/2023 for SOB.    She was recently hospitalized here from 12/20/2022 to 12/25/2022 for right hip fracture following mechanical fall.  She underwent total right hip arthroplasty on 12/21/2022.  Postop course was complicated by hypoxia.  She was felt not volume overloaded on clinical exam.  Echo from 12/25/2022 showed LVEF 65%, difficult to assess regional wall motion, mild LVH, grade 1 DD, normal RV, no significant valvular disease.  CTA chest was negative for PE but positive for bronchopneumonia.  She was started on antibiotic and hypoxia had resolved before discharge.  She returned from [facility] to ER on 12/29/2022, states she was doing well until few days ago that she started having increased shortness of breath and cough.  Staff found her hypoxic 01/02/2023 night.  SNF had performed a chest x-ray which revealed new pneumonia.  She was given antibiotic and transferred to ER for further evaluation.   Admission diagnostic revealed significant leukocytosis 23,000, 9.2.  Lactic acid WNL.  INR 1.3.  Albumin 2.7, otherwise CMP grossly unremarkable.  pH 7.3. Flu and and RSV COVID-negative.  BNP elevated at 752. UA + protein. Urine culture + stap epidermidis.  Blood culture x 2 negative to date. CTA chest revealed no PE, small to moderate bilateral pleural effusion, extensive progressive patchy groundglass and organizing consolidation within both lungs.  Coronary calcification was noted.  She is admitted to  hospital medicine service, intimately required 4 L nasal cannula oxygen and BiPAP support for acute hypoxic respiratory failure.  She was treated with broad-spectrum antibiotic. She was felt in acute heart failure given elevated BNP, was started on IV Lasix 40 mg twice daily. She was also noted in new onset of A fib RVR. She was treated with IV diltiazem drip which has been weaned off, started on anticoagulation with heparin infusion for CHA2DS2-VASc score of 4 and metoprolol 50 mg twice daily.  Cardiology is consulted today for further evaluation of A-fib RVR and acute CHF.  Historically she was seen by Dr. Irish Lack on 08/2019 for controlled hypertension, had mentioned some atypical chest pain over the past 10 years, seems to be associated with anxiety.  She was advised to decrease carvedilol to 3.125 mg twice daily and start amlodipine 5 mg daily.  Subsequent telemedicine visit reports that she had improved control of hypertension.  She was lost to follow-up since.   Upon encounter, she is sedated on precedex gtt, open eyes to voice, on BIPAP, not able to provide much history. She denied having chest pain. She states she had breathing difficulties. She does not recall any heart issue such as CHF, not on diuretic at home. She does not recall feeling SOB before she had pneumonia.      Past Medical History:  Diagnosis Date   Anxiety    Aortic atherosclerosis (HCC)    Avascular necrosis of femur, right (HCC)    Balance problems    Chronic kidney disease, stage II (mild)    DDD (  degenerative disc disease), lumbosacral    Depression    DJD (degenerative joint disease), lumbar    Elevated cholesterol    GERD (gastroesophageal reflux disease)    HOH (hard of hearing)    Hypertension    Hypertensive renal disease, benign    Hypokalemia    Hyponatremia    Hypothyroidism    IBS (irritable bowel syndrome)    Major depression, recurrent (HCC)    Osteopenia    Osteoporosis    Post-menopausal     Reflux    Subclinical hypothyroidism    Varicose vein of leg    Vitamin D deficiency     Past Surgical History:  Procedure Laterality Date   BREAST EXCISIONAL BIOPSY Bilateral    BREAST SURGERY     Benign breast lumps   CATARACT EXTRACTION     CHOLECYSTECTOMY     FOOT SURGERY     TOTAL HIP ARTHROPLASTY Right 12/21/2022   Procedure: TOTAL HIP ARTHROPLASTY ANTERIOR APPROACH;  Surgeon: Paralee Cancel, MD;  Location: [facility];  Service: Orthopedics;  Laterality: Right;   VAGINAL HYSTERECTOMY     A & P repair     Home Medications:  Prior to Admission medications   Medication Sig Start Date End Date Taking? Authorizing Provider  acetaminophen (TYLENOL) 325 MG tablet Take 1-2 tablets (325-650 mg total) by mouth every 6 (six) hours as needed for mild pain (pain score 1-3 or temp > 100.5). Patient taking differently: Take 650 mg by mouth 3 (three) times daily. 12/25/22  Yes Shalhoub, Sherryll Burger, MD  ascorbic acid (VITAMIN C) 500 MG tablet Take 500 mg by mouth in the morning.   Yes [provider]  aspirin 81 MG tablet Take 1 tablet (81 mg total) by mouth at bedtime. Begin daily aspirin regimen after 28-day course of twice daily aspirin is complete. Patient taking differently: Take 81 mg by mouth See admin instructions. Starting on 01/01/2023, take 81 mg by mouth at 8 AM and 8 PM for 2 weeks 12/25/22  Yes Shalhoub, Sherryll Burger, MD  B Complex Vitamins (VITAMIN-B COMPLEX) TABS Take 1 tablet by mouth daily with breakfast.   Yes [provider]  CALCIUM CITRATE+D3 PETITES 200-6.25 MG-MCG TABS Take 2 tablets by mouth daily.   Yes [provider]  Cholecalciferol (VITAMIN D3) 50 MCG (2000 UT) TABS Take 2,000 Units by mouth every Monday, Wednesday, and Friday.   Yes [provider]  Coenzyme Q10 (CO Q 10) 100 MG CAPS Take 200 mg by mouth daily.   Yes [provider]  conjugated estrogens (PREMARIN) vaginal cream INSERT 1/4 APPLICATOR VAGINALLY 2 TIMES A WEEK AS  DIRECTED Patient taking differently: Place 0.62 Applicatorfuls vaginally See admin instructions. Place 3.76 applicator vaginally once a day only on Tuesdays and Fridays 11/25/22  Yes Wallace, Tiffany A, NP  diazepam (VALIUM) 2 MG tablet Take 1 mg by mouth every 6 (six) hours as needed for anxiety.   Yes [provider]  Flaxseed, Linseed, (FLAX SEEDS) POWD Take 15 g by mouth daily with breakfast.   Yes [provider]  levofloxacin (LEVAQUIN) 750 MG tablet Take 750 mg by mouth daily. 12/28/2022 01/10/23 Yes [provider]  levothyroxine (SYNTHROID) 25 MCG tablet Take 25-37.5 mcg by mouth See admin instructions. Take 37.5 mcg by mouth before breakfast on Sun/Sat and 25 mcg on Mon/Tues/Wed/Thurs/Fri 05/22/22  Yes [provider]  loratadine (CLARITIN) 10 MG tablet Take 10 mg by mouth at bedtime.   Yes [provider]  losartan (COZAAR) 100 MG tablet Take 100 mg by mouth See admin instructions. Take 100 mg by mouth in the morning and notify the MD if the Systolic number is <17 or >494 OR Diastolic number is <49 or >100   Yes [provider]  methocarbamol (ROBAXIN) 500 MG tablet Take 1 tablet (500 mg total) by mouth every 6 (six) hours as needed for muscle spasms. 12/25/22  Yes Shalhoub, Sherryll Burger, MD  Multiple Vitamin (MULTIVITAMIN) tablet Take 1 tablet by mouth daily with breakfast.   Yes [provider]  NON FORMULARY Take by mouth See admin instructions. MedPass 2.0- As directed 2 times a day for nutritional supplement   Yes [provider]  Omega-3 Fatty Acids (FISH OIL) 1200 MG CAPS Take 1,200 mg by mouth daily.   Yes [provider]  ondansetron (ZOFRAN) 4 MG tablet Take 1 tablet (4 mg total) by mouth every 6 (six) hours as needed for nausea. 12/25/22  Yes Shalhoub, Sherryll Burger, MD  polyethylene glycol (MIRALAX / GLYCOLAX) 17 g packet Take 17 g by mouth daily as needed. Patient taking differently: Take 17 g by mouth daily as  needed for mild constipation (to be mixed into 6-8 ounces of juice or water). 12/25/22  Yes Shalhoub, Sherryll Burger, MD  rosuvastatin (CRESTOR) 5 MG tablet Take 5 mg by mouth at bedtime.   Yes [provider]  sertraline (ZOLOFT) 100 MG tablet Take 200 mg by mouth in the morning.   Yes [provider]  SIMPLY SALINE 0.9 % AERS Place 1 spray into both nostrils 4 (four) times daily.   Yes [provider]  acetaminophen (TYLENOL) 325 MG tablet Take 2 tablets (650 mg total) by mouth 2 (two) times daily. Patient not taking: Reported on 12/19/2022 12/25/22   Vernelle Emerald, MD  aspirin 81 MG chewable tablet Chew 1 tablet (81 mg total) by mouth 2 (two) times daily for 28 days. Patient not taking: Reported on 01/11/2023 12/25/22 01/22/23  Vernelle Emerald, MD  feeding supplement (ENSURE ENLIVE / ENSURE PLUS) LIQD Take 237 mLs by mouth 2 (two) times daily between meals. Patient not taking: Reported on 12/16/2022 12/25/22   Vernelle Emerald, MD    Inpatient Medications: Scheduled Meds:  aspirin  81 mg Oral BID   Chlorhexidine Gluconate Cloth  6 each Topical Daily   furosemide  40 mg Intravenous Q8H   ipratropium  0.5 mg Nebulization Q6H   levalbuterol  0.63 mg Nebulization Q6H   levothyroxine  25 mcg Oral Once per day on Mon Tue Wed Thu Fri   [START ON 01/10/2023] levothyroxine  37.5 mcg Oral Once per day on Sun Sat   methylPREDNISolone (SOLU-MEDROL) injection  40 mg Intravenous Q12H   metoprolol tartrate  50 mg Oral BID   mouth rinse  15 mL Mouth Rinse 4 times per day   potassium chloride  40 mEq Oral BID   rosuvastatin  5 mg Oral QHS   Continuous Infusions:  albumin human     azithromycin     ceFEPime (MAXIPIME) IV 2 g (01/06/23 1205)   dexmedetomidine (PRECEDEX) IV infusion 0.4 mcg/kg/hr (01/06/23 1202)   heparin 1,350 Units/hr (01/06/23 1006)   PRN Meds: acetaminophen **OR** acetaminophen, diazepam, hydrALAZINE, levalbuterol, ondansetron **OR** ondansetron (ZOFRAN)  IV, mouth rinse  Allergies:    Allergies  Allergen Reactions   Nitrofurantoin Nausea Only and Other (See Comments)    Upsets the stomach and "Allergic," per MAR   Ace Inhibitors Other (See  Comments)    "Allergic," per MAR   Codeine Nausea And Vomiting and Other (See Comments)    "Allergic," per MAR   Cymbalta [Duloxetine Hcl] Other (See Comments)    "Allergic," per MAR   Doxycycline Other (See Comments)    "Allergic," per MAR   Gabapentin Nausea Only and Other (See Comments)    "Allergic," per MAR   Hctz [Hydrochlorothiazide] Other (See Comments)    "Allergic," per MAR   Lisinopril Other (See Comments) and Cough    "Allergic," per MAR   Other Other (See Comments)    Unnamed B/P tablet- made the patient "pass out"   Penicillins Hives   Latex Rash   Sulfa Antibiotics Rash   Tape Rash and Other (See Comments)    Cloth tape preferred- NO latex tape!!    Social History:   Social History   Socioeconomic History   Marital status: Married    Spouse name: Not on file   Number of children: Not on file   Years of education: Not on file   Highest education level: Not on file  Occupational History   Not on file  Tobacco Use   Smoking status: Never   Smokeless tobacco: Never  Vaping Use   Vaping Use: Never used  Substance and Sexual Activity   Alcohol use: No    Alcohol/week: 0.0 standard drinks of alcohol   Drug use: No   Sexual activity: Not Currently    Partners: Male    Birth control/protection: Post-menopausal, Surgical    Comment: 1st intercourse 20 yo-1 partner  Other Topics Concern   Not on file  Social History Narrative   Not on file   Social Determinants of Health   Financial Resource Strain: Not on file  Food Insecurity: Not on file  Transportation Needs: Not on file  Physical Activity: Not on file  Stress: Not on file  Social Connections: Not on file  Intimate Partner Violence: Not on file    Family History:    Family History  Problem Relation Age  of Onset   Stroke Mother    Hypertension Mother    Uterine cancer Mother    Stroke Father    Heart attack Brother    Stroke Brother    Diabetes Maternal Grandmother    Heart attack Paternal Grandmother    Breast cancer Maternal Aunt    Hyperlipidemia Sister    Thyroid disease Sister    Colon polyps Daughter    Colon polyps Son    Other Grandson        NEOFIBROTOSIS     ROS:  Unable to obtain due to Bipap use and sedation   Physical Exam/Data:   Vitals:   01/06/23 0800 01/06/23 0900 01/06/23 1042 01/06/23 1106  BP: (!) 178/63 (!) 180/71 (!) 182/79   Pulse: 76 88  63  Resp: (!) 35 (!) 33  (!) 33  Temp: 98.5 F (36.9 C)     TempSrc: Axillary     SpO2: 98% 97%  99%  Weight:      Height:        Intake/Output Summary (Last 24 hours) at 01/06/2023 1234 Last data filed at 01/06/2023 1006 Gross per 24 hour  Intake 1204.58 ml  Output 1750 ml  Net -545.42 ml      12/16/2022    7:53 AM 12/19/2022   11:21 PM 07/08/2022   11:41 AM  Last 3 Weights  Weight (lbs) 125 lb 125 lb 123 lb  Weight (kg)  56.7 kg 56.7 kg 55.792 kg     Body mass index is 22.86 kg/m.  Vitals:  Vitals:   01/06/23 1106 01/06/23 1200  BP:  (!) 150/56  Pulse: 63 65  Resp: (!) 33 (!) 25  Temp:    SpO2: 99% 94%   General Appearance: In no apparent distress, laying in bed HEENT: Normocephalic, atraumatic.  Neck: Supple, trachea midline, no JVDs Cardiovascular: Regular rate and rhythm, normal S1-S2,  no murmur  Respiratory: Resting breathing unlabored, lungs sounds with coarse crackles bilaterally, on BIPAP Gastrointestinal: Bowel sounds positive, abdomen soft Extremities: Able to move all extremities in bed without difficulty, no leg edema, SCD in place  Musculoskeletal: Normal muscle bulk and tone Skin: Intact, warm, dry. No rashes or petechiae noted in exposed areas.  Neurologic: Open eyes to voice, sleepy, minimally verbal, sedated, followed one step commands, limited historian Psychiatric: Normal  affect. Mood is appropriate.    EKG:  The EKG was personally reviewed and demonstrates:    EKG from 12/19/2022 07:49 showed sinus tachycardia 147bpm, PACs, rate associated general ST depression  EKG from 12/19/2022 11:59 showed sinus tachycardia 123 bpm, PACs   EKG from 01/04/23 atrial tachycardia 149 bpm, PACs, rate associated general ST depression   Telemetry:  Telemetry was personally reviewed and demonstrates:    Sinus rhythm 60s currently, noted multifocal atrial tachycardia with rate up to 140s, frequent PACs, no A fib is seen  Relevant CV Studies:  Echocardiogram 12/25/2022:   1. Left ventricular ejection fraction, by estimation, is 65%. The left  ventricle has normal function. Left ventricular endocardial border not  optimally defined to evaluate regional wall motion. There is mild left  ventricular hypertrophy. Left  ventricular diastolic parameters are consistent with Grade I diastolic  dysfunction (impaired relaxation).   2. Right ventricular systolic function is normal. The right ventricular  size is normal. There is mildly elevated pulmonary artery systolic  pressure.   3. The mitral valve is grossly normal. No evidence of mitral valve  regurgitation. The mean mitral valve gradient is 2.0 mmHg with average  heart rate of 86 bpm. Severe mitral annular calcification.   4. The aortic valve was not well visualized. There is mild calcification  of the aortic valve. Aortic valve regurgitation is not visualized. Aortic  valve sclerosis is present, with no evidence of aortic valve stenosis.   5. The inferior vena cava is normal in size with greater than 50%  respiratory variability, suggesting right atrial pressure of 3 mmHg.   Comparison(s): No prior Echocardiogram.    Laboratory Data:  High Sensitivity Troponin:  No results for input(s): "TROPONINIHS" in the last 720 hours.   Chemistry Recent Labs  Lab 01/04/23 1002 01/05/23 0931 01/06/23 1058  NA 137 139 139  K 3.1*  3.4* 3.5  CL 98 97* 96*  CO2 27 28 32  GLUCOSE 121* 171* 203*  BUN 14 20 24*  CREATININE 0.52 0.59 0.59  CALCIUM 8.0* 8.0* 8.0*  GFRNONAA >60 >60 >60  ANIONGAP '12 14 11    '$ Recent Labs  Lab 01/10/2023 0825 01/04/23 1002  PROT 6.7 6.5  ALBUMIN 2.7* 2.6*  AST 29 40  ALT 24 33  ALKPHOS 90 130*  BILITOT 0.5 0.6   Lipids No results for input(s): "CHOL", "TRIG", "HDL", "LABVLDL", "LDLCALC", "CHOLHDL" in the last 168 hours.  Hematology Recent Labs  Lab 01/04/23 1002 01/05/23 0931 01/06/23 0721  WBC 17.8* 20.4* 20.9*  RBC 2.76* 2.90* 2.88*  HGB 9.0* 9.3* 9.2*  HCT 27.2* 28.8* 28.3*  MCV 98.6 99.3 98.3  MCH 32.6 32.1 31.9  MCHC 33.1 32.3 32.5  RDW 13.3 13.5 13.5  PLT 598* 667* 642*   Thyroid No results for input(s): "TSH", "FREET4" in the last 168 hours.  BNP Recent Labs  Lab 12/17/2022 1154 01/06/23 1058  BNP 752.5* 768.5*    DDimer  Recent Labs  Lab 01/05/2023 1154  DDIMER 2.91*     Radiology/Studies:  DG Chest Port 1V same Day  Result Date: 01/06/2023 CLINICAL DATA:  Pneumonia EXAM: PORTABLE CHEST 1 VIEW COMPARISON:  12/30/2022 FINDINGS: The heart size and mediastinal diffuse interstitial and alveolar opacity consistent with worsening pulmonary edema and/or diffuse bilateral pneumonia. There is suggestion of small pleural effusions. No pneumothorax identified. Calcified aorta. IMPRESSION: Diffuse interstitial and alveolar opacities with interval worsening. Electronically Signed   By: Sammie Bench M.D.   On: 01/06/2023 08:36   CT Angio Chest Pulmonary Embolism (PE) W or WO Contrast  Result Date: 12/19/2022 CLINICAL DATA:  Evaluate for pulmonary embolism. Acute shortness of breath. EXAM: CT ANGIOGRAPHY CHEST WITH CONTRAST TECHNIQUE: Multidetector CT imaging of the chest was performed using the standard protocol during bolus administration of intravenous contrast. Multiplanar CT image reconstructions and MIPs were obtained to evaluate the vascular anatomy. RADIATION  DOSE REDUCTION: This exam was performed according to the departmental dose-optimization program which includes automated exposure control, adjustment of the mA and/or kV according to patient size and/or use of iterative reconstruction technique. CONTRAST:  43m OMNIPAQUE IOHEXOL 350 MG/ML SOLN COMPARISON:  12/24/2022 FINDINGS: Cardiovascular: There is satisfactory opacification of the pulmonary arteries. Exam limited by motion artifact. No signs of acute pulmonary embolus. Heart size is upper limits of normal. No pericardial effusion. Aortic atherosclerosis and coronary artery calcifications. Mediastinum/Nodes: Thyroid gland, trachea and esophagus are unremarkable. No enlarged axillary, mediastinal, or hilar lymph nodes. Lungs/Pleura: Small to moderate bilateral pleural effusions are identified. These are increased in volume compared with the previous exam. Extensive, progressive scratch compared with 12/24/2022 there is extensive, progressive patchy ground-glass and organizing consolidation. Upper Abdomen: No acute abnormality. Musculoskeletal: Chronic appearing superior endplate fracture within the upper lumbar region. Unchanged from previous exam. No acute or suspicious osseous findings. Review of the MIP images confirms the above findings. IMPRESSION: 1. Exam limited by motion artifact. No signs of acute pulmonary embolus. 2. Small to moderate bilateral pleural effusions are identified. These are increased in volume compared with the previous exam. 3. Extensive, progressive patchy ground-glass and organizing consolidation is identified within both lungs. Imaging findings are concerning for progressive widespread bronchopneumonia. Atypical infection, including COVID infection is not excluded. 4. Coronary artery calcifications. 5. Chronic appearing superior endplate fracture within the upper lumbar region. Unchanged from previous exam. 6.  Aortic Atherosclerosis (ICD10-I70.0). Electronically Signed   By: TKerby MoorsM.D.   On: 12/16/2022 14:00   DG Chest Port 1 View  Result Date: 01/05/2023 CLINICAL DATA:  Possible sepsis.  Diagnosed pneumonia 1 month ago. EXAM: PORTABLE CHEST 1 VIEW COMPARISON:  12/23/2022 FINDINGS: Lungs are adequately inflated demonstrate new bilateral patchy airspace process likely multifocal infection. No effusion or pneumothorax. Cardiomediastinal silhouette and remainder of the exam is unchanged. IMPRESSION: New bilateral patchy airspace process likely multifocal infection. Electronically Signed   By: DMarin OlpM.D.   On: 12/28/2022 08:45     Assessment and Plan:   Multifocal atrial tachycardia - EKG and telemetry reviewed, appears to be MAT rather A fib , frequent PACs is making rhythm irregular and challenging,  but P waves are present  -  in the setting of multifocal pneumonia - aim to treat underlying infection - may use metoprolol for rate control   Acute diastolic heart failure - BNP 768 - Echo with LVEF preserved, grade I DD - no historical signs of CHF - possible volume overload from fluid/albumin infusion for sepsis treatment  - Ok to use IV Lasix for now, may not need at the time of discharge, will assess, trac weight, daily I&O - GDMT: convert to Toprol XL at time of discharge, possible candidate for ARNI/SGLT2I/MRA, will re-assess when acute illness resolve  Hypertension - BP elevated, may switch Toprol to Coreg if not improving after anxiety controlled   Acute hypoxic respiratory failure Multifocal pneumonia Hyperlipidemia  Hypothyroidism - per primary team     Risk Assessment/Risk Scores:   New York Heart Association (NYHA) Functional Class NYHA Class I   For questions or updates, please contact [place] Please consult [email or web address] for contact info under    Signed, Margie Billet, NP  01/06/2023 12:34 PM

## 2023-01-06 NOTE — Progress Notes (Signed)
[facility]  PROGRESS NOTE  Brittany Douglas KDX:[id number] DOB: 1941/07/24 DOA: 01/04/2023 PCP: [name], MD   Brief HPI:   82 year old female with medical history of hypothyroidism, hypertension, anxiety/depression, hyperlipidemia presented with shortness of breath.  She was recently discharged to SNF after a stay for right hip fracture and pneumonia.  She was sent home on cefdinir and finished antibiotics course.  She started becoming short of breath.  At the facility she was short of breath and found to have pneumonia on chest x-ray.  She was placed on 4 L nasal cannula and transferred to ED for evaluation. Patient was started on vancomycin and cefepime.    Subjective   Patient continues to be on BiPAP.  Heart rate improved after increasing dose of metoprolol.  Diuresing well with IV Lasix.  Blood pressure still elevated.  Chest x-ray obtained this morning showed worsening of pulmonary edema.   Assessment/Plan:    Acute hypoxemic respiratory failure -Multifactorial, chest x-ray showed new bilateral patchy airspace process likely multifocal infection -Also BNP elevated to 765; echocardiogram from 12/25/2022 showed grade 2 diastolic dysfunction -Patient has been intermittently put on BiPAP; patient is on 4 L of oxygen -Patient started on vancomycin and cefepime; vancomycin has been stopped as MRSA is negative.  Continue cefepime -Follow blood culture results; procalcitonin 0.12 -WBC is up to 20,000 -Influenza A, influenza B, RSV, SARS-CoV-2 RT-PCR negative -She diuresed well with IV Lasix  20 mg IV every 12 hours. -Lasix to 40 mg IV every 12 hours -Will consult pulmonology for further assistance  Atrial fibrillation with RVR -Patient went into A-fib with RVR yesterday -Started on IV Cardizem gtt. Cardizem has been weaned off -Also started on heparin GGT; CHA2DS2VASc score was at least 4 -Continue metoprolol 50 mg p.o. twice daily -Will consult cardiology to help  with A-fib as well as CHF exacerbation  Acute on chronic diastolic CHF -BNP was elevated to 765 -Echocardiogram showed grade 2 diastolic dysfunction -Initially started on Lasix 20 mg IV twice daily with good diuresis -Will increase Lasix to 40 mg IV every 12 hours -Follow BMP in am  Hypothyroidism -Continue Synthroid  Hypertension -Increase dose of metoprolol to 50 mg p.o. twice daily -Start IV hydralazine as needed  Hyperlipidemia -Continue rosuvastatin  Normocytic anemia -At baseline   Medications     aspirin  81 mg Oral BID   Chlorhexidine Gluconate Cloth  6 each Topical Daily   furosemide  40 mg Intravenous Q8H   ipratropium  0.5 mg Nebulization Q6H   levalbuterol  0.63 mg Nebulization Q6H   levothyroxine  25 mcg Oral Once per day on Mon Tue Wed Thu Fri   [START ON 01/10/2023] levothyroxine  37.5 mcg Oral Once per day on Sun Sat   methylPREDNISolone (SOLU-MEDROL) injection  40 mg Intravenous Q12H   metoprolol tartrate  50 mg Oral BID   mouth rinse  15 mL Mouth Rinse 4 times per day   potassium chloride  40 mEq Oral BID   rosuvastatin  5 mg Oral QHS     Data Reviewed:   CBG:  No results for input(s): "GLUCAP" in the last 168 hours.  SpO2: 99 % O2 Flow Rate (L/min): 6 L/min FiO2 (%): 40 %    Vitals:   01/06/23 0800 01/06/23 0900 01/06/23 1042 01/06/23 1106  BP: (!) 178/63 (!) 180/71 (!) 182/79   Pulse: 76 88  63  Resp: (!) 35 (!) 33  (!) 33  Temp: 98.5 F (36.9 C)  TempSrc: Axillary     SpO2: 98% 97%  99%  Weight:      Height:          Data Reviewed:  Basic Metabolic Panel: Recent Labs  Lab 01/13/2023 0825 01/04/23 1002 01/05/23 0931  NA 134* 137 139  K 3.7 3.1* 3.4*  CL 97* 98 97*  CO2 '26 27 28  '$ GLUCOSE 163* 121* 171*  BUN '9 14 20  '$ CREATININE 0.52 0.52 0.59  CALCIUM 8.3* 8.0* 8.0*    CBC: Recent Labs  Lab 01/13/2023 0825 01/04/23 1002 01/05/23 0931 01/06/23 0721  WBC 23.0* 17.8* 20.4* 20.9*  NEUTROABS 21.2*  --   --   --    HGB 9.2* 9.0* 9.3* 9.2*  HCT 27.5* 27.2* 28.8* 28.3*  MCV 98.9 98.6 99.3 98.3  PLT 593* 598* 667* 642*    LFT Recent Labs  Lab 01/01/2023 0825 01/04/23 1002  AST 29 40  ALT 24 33  ALKPHOS 90 130*  BILITOT 0.5 0.6  PROT 6.7 6.5  ALBUMIN 2.7* 2.6*     Antibiotics: Anti-infectives (From admission, onward)    Start     Dose/Rate Route Frequency Ordered Stop   01/06/23 1200  azithromycin (ZITHROMAX) 500 mg in sodium chloride 0.9 % 250 mL IVPB        500 mg 250 mL/hr over 60 Minutes Intravenous Every 24 hours 01/06/23 1046 01/09/23 1159   01/04/23 1200  vancomycin (VANCOREADY) IVPB 750 mg/150 mL  Status:  Discontinued        750 mg 150 mL/hr over 60 Minutes Intravenous Every 24 hours 12/16/2022 1155 01/04/23 1314   12/27/2022 1130  ceFEPIme (MAXIPIME) 2 g in sodium chloride 0.9 % 100 mL IVPB        2 g 200 mL/hr over 30 Minutes Intravenous Every 12 hours 12/26/2022 1116     12/24/2022 1130  vancomycin (VANCOREADY) IVPB 1250 mg/250 mL        1,250 mg 166.7 mL/hr over 90 Minutes Intravenous  Once 01/04/2023 1116 01/02/2023 1356   12/30/2022 0800  cefTRIAXone (ROCEPHIN) 2 g in sodium chloride 0.9 % 100 mL IVPB  Status:  Discontinued        2 g 200 mL/hr over 30 Minutes Intravenous Every 24 hours 01/05/2023 0759 12/30/2022 1101   12/16/2022 0800  azithromycin (ZITHROMAX) 500 mg in sodium chloride 0.9 % 250 mL IVPB  Status:  Discontinued        500 mg 250 mL/hr over 60 Minutes Intravenous Every 24 hours 12/30/2022 0759 01/11/2023 1101        DVT prophylaxis: SCDs, patient also has been on aspirin 81 mg p.o. twice daily since 12/25/2022 for 28 days  Code Status: DNR  Family Communication: Discussed with patient's daughter at bedside   CONSULTS    Objective    Physical Examination:  Appears in no acute distress S1-S2, regular Lungs bilateral crackles auscultated Abdomen is soft, nontender, no organomegaly   Status is: Inpatient:          Oswald Hillock   [facility] If  7PM-7AM, please contact night-coverage at [email or web address], Office  [phone]   01/06/2023, 11:54 AM  LOS: 3 days

## 2023-01-06 NOTE — Progress Notes (Addendum)
ANTICOAGULATION CONSULT NOTE - Follow-Up Consult  Pharmacy Consult for heparin Indication: atrial fibrillation  Allergies  Allergen Reactions   Nitrofurantoin Nausea Only and Other (See Comments)    Upsets the stomach and "Allergic," per [facility]   Ace Inhibitors Other (See Comments)    "Allergic," per [facility]   Codeine Nausea And Vomiting and Other (See Comments)    "Allergic," per [facility]   Cymbalta [Duloxetine Hcl] Other (See Comments)    "Allergic," per [facility]   Doxycycline Other (See Comments)    "Allergic," per [facility]   Gabapentin Nausea Only and Other (See Comments)    "Allergic," per [facility]   Hctz [Hydrochlorothiazide] Other (See Comments)    "Allergic," per [facility]   Lisinopril Other (See Comments) and Cough    "Allergic," per [facility]   Other Other (See Comments)    Unnamed B/P tablet- made the patient "pass out"   Penicillins Hives   Latex Rash   Sulfa Antibiotics Rash   Tape Rash and Other (See Comments)    Cloth tape preferred- NO latex tape!!    Patient Measurements: Height: '5\' 2"'$  (157.5 cm) Weight: 56.7 kg (125 lb) IBW/kg (Calculated) : 50.1 Heparin Dosing Weight: actual body weight  Vital Signs: Temp: 97.6 F (36.4 C) (01/23 1800) Temp Source: Axillary (01/23 1600) BP: 139/68 (01/23 1900) Pulse Rate: 74 (01/23 1900)  Labs: Recent Labs    01/04/23 1002 01/04/23 2211 01/05/23 0931 01/05/23 2204 01/06/23 0721 01/06/23 1058 01/06/23 1747  HGB 9.0*  --  9.3*  --  9.2*  --   --   HCT 27.2*  --  28.8*  --  28.3*  --   --   PLT 598*  --  667*  --  642*  --   --   HEPARINUNFRC  --    < > <0.10* 0.23* 0.29*  --  0.35  CREATININE 0.52  --  0.59  --   --  0.59  --    < > = values in this interval not displayed.     Estimated Creatinine Clearance: 42.9 mL/min (by C-G formula based on SCr of 0.59 mg/dL).   Medical History: Past Medical History:  Diagnosis Date   Anxiety    Aortic atherosclerosis (HCC)    Avascular necrosis of femur, right (HCC)    Balance problems     Chronic kidney disease, stage II (mild)    DDD (degenerative disc disease), lumbosacral    Depression    DJD (degenerative joint disease), lumbar    Elevated cholesterol    GERD (gastroesophageal reflux disease)    HOH (hard of hearing)    Hypertension    Hypertensive renal disease, benign    Hypokalemia    Hyponatremia    Hypothyroidism    IBS (irritable bowel syndrome)    Major depression, recurrent (HCC)    Osteopenia    Osteoporosis    Post-menopausal    Reflux    Subclinical hypothyroidism    Varicose vein of leg    Vitamin D deficiency     Assessment: 82 yo F with new onset AFib w/ RVR.  Pharmacy consulted to dose heparin.   01/06/23 Heparin level 0.35 units/mL, now therapeutic on heparin infusion at 1450 units/hr CBC stable No bleeding or infusion issues noted per nursing  Goal of Therapy:  Heparin level 0.3-0.7 units/ml Monitor platelets by anticoagulation protocol: Yes   Plan:  -Continue heparin infusion at 1450 units/hr -Check 8 hour heparin level to ensure remains in therapeutic range -Daily  CBC, heparin level -Monitor closely for s/sx of bleeding -F/u cardiology recommendations regarding long-term anticoagulation   Lindell Spar, PharmD, BCPS Clinical Pharmacist 01/06/2023 7:13 PM

## 2023-01-06 NOTE — Progress Notes (Signed)
ANTICOAGULATION CONSULT NOTE - Follow-Up Consult  Pharmacy Consult for heparin Indication: atrial fibrillation  Allergies  Allergen Reactions   Nitrofurantoin Nausea Only and Other (See Comments)    Upsets the stomach and "Allergic," per [facility]   Ace Inhibitors Other (See Comments)    "Allergic," per [facility]   Codeine Nausea And Vomiting and Other (See Comments)    "Allergic," per [facility]   Cymbalta [Duloxetine Hcl] Other (See Comments)    "Allergic," per [facility]   Doxycycline Other (See Comments)    "Allergic," per [facility]   Gabapentin Nausea Only and Other (See Comments)    "Allergic," per [facility]   Hctz [Hydrochlorothiazide] Other (See Comments)    "Allergic," per [facility]   Lisinopril Other (See Comments) and Cough    "Allergic," per [facility]   Other Other (See Comments)    Unnamed B/P tablet- made the patient "pass out"   Penicillins Hives   Latex Rash   Sulfa Antibiotics Rash   Tape Rash and Other (See Comments)    Cloth tape preferred- NO latex tape!!    Patient Measurements: Height: '5\' 2"'$  (157.5 cm) Weight: 56.7 kg (125 lb) IBW/kg (Calculated) : 50.1 Heparin Dosing Weight: actual body weight  Vital Signs: Temp: 97.7 F (36.5 C) (01/23 1200) Temp Source: Axillary (01/23 1200) BP: 150/56 (01/23 1200) Pulse Rate: 65 (01/23 1200)  Labs: Recent Labs    01/04/23 1002 01/04/23 2211 01/05/23 0931 01/05/23 2204 01/06/23 0721 01/06/23 1058  HGB 9.0*  --  9.3*  --  9.2*  --   HCT 27.2*  --  28.8*  --  28.3*  --   PLT 598*  --  667*  --  642*  --   HEPARINUNFRC  --    < > <0.10* 0.23* 0.29*  --   CREATININE 0.52  --  0.59  --   --  0.59   < > = values in this interval not displayed.     Estimated Creatinine Clearance: 42.9 mL/min (by C-G formula based on SCr of 0.59 mg/dL).   Medical History: Past Medical History:  Diagnosis Date   Anxiety    Aortic atherosclerosis (HCC)    Avascular necrosis of femur, right (HCC)    Balance problems    Chronic kidney disease, stage II (mild)     DDD (degenerative disc disease), lumbosacral    Depression    DJD (degenerative joint disease), lumbar    Elevated cholesterol    GERD (gastroesophageal reflux disease)    HOH (hard of hearing)    Hypertension    Hypertensive renal disease, benign    Hypokalemia    Hyponatremia    Hypothyroidism    IBS (irritable bowel syndrome)    Major depression, recurrent (HCC)    Osteopenia    Osteoporosis    Post-menopausal    Reflux    Subclinical hypothyroidism    Varicose vein of leg    Vitamin D deficiency     Medications:  Medications Prior to Admission  Medication Sig Dispense Refill Last Dose   acetaminophen (TYLENOL) 325 MG tablet Take 1-2 tablets (325-650 mg total) by mouth every 6 (six) hours as needed for mild pain (pain score 1-3 or temp > 100.5). (Patient taking differently: Take 650 mg by mouth 3 (three) times daily.)   01/02/2023 at 2000   ascorbic acid (VITAMIN C) 500 MG tablet Take 500 mg by mouth in the morning.   01/02/2023 at 0800   aspirin 81 MG tablet Take 1 tablet (81  mg total) by mouth at bedtime. Begin daily aspirin regimen after 28-day course of twice daily aspirin is complete. (Patient taking differently: Take 81 mg by mouth See admin instructions. Starting on 01/01/2023, take 81 mg by mouth at 8 AM and 8 PM for 2 weeks) 30 tablet  01/02/2023 at 2000   B Complex Vitamins (VITAMIN-B COMPLEX) TABS Take 1 tablet by mouth daily with breakfast.   01/02/2023 at 0800   CALCIUM CITRATE+D3 PETITES 200-6.25 MG-MCG TABS Take 2 tablets by mouth daily.   01/02/2023 at 0800   Cholecalciferol (VITAMIN D3) 50 MCG (2000 UT) TABS Take 2,000 Units by mouth every Monday, Wednesday, and Friday.   01/02/2023 at 0800   Coenzyme Q10 (CO Q 10) 100 MG CAPS Take 200 mg by mouth daily.   01/02/2023 at 0800   conjugated estrogens (PREMARIN) vaginal cream INSERT 1/4 APPLICATOR VAGINALLY 2 TIMES A WEEK AS DIRECTED (Patient taking differently: Place 2.77 Applicatorfuls vaginally See admin instructions.  Place 4.12 applicator vaginally once a day only on Tuesdays and Fridays) 30 g 1 01/02/2023 at am   diazepam (VALIUM) 2 MG tablet Take 1 mg by mouth every 6 (six) hours as needed for anxiety.   12/23/2022 at 0119   Flaxseed, Linseed, (FLAX SEEDS) POWD Take 15 g by mouth daily with breakfast.   01/02/2023 at 0800   levofloxacin (LEVAQUIN) 750 MG tablet Take 750 mg by mouth daily.   01/01/2023 at 0200   levothyroxine (SYNTHROID) 25 MCG tablet Take 25-37.5 mcg by mouth See admin instructions. Take 37.5 mcg by mouth before breakfast on Sun/Sat and 25 mcg on Mon/Tues/Wed/Thurs/Fri   12/20/2022 at 0600   loratadine (CLARITIN) 10 MG tablet Take 10 mg by mouth at bedtime.   01/02/2023 at 2000   losartan (COZAAR) 100 MG tablet Take 100 mg by mouth See admin instructions. Take 100 mg by mouth in the morning and notify the MD if the Systolic number is <87 or >867 OR Diastolic number is <67 or >100   01/02/2023 at 0800   methocarbamol (ROBAXIN) 500 MG tablet Take 1 tablet (500 mg total) by mouth every 6 (six) hours as needed for muscle spasms.   01/02/2023 at 0000   Multiple Vitamin (MULTIVITAMIN) tablet Take 1 tablet by mouth daily with breakfast.   01/02/2023 at 0800   NON FORMULARY Take by mouth See admin instructions. MedPass 2.0- As directed 2 times a day for nutritional supplement   01/02/2023 at pm   Omega-3 Fatty Acids (FISH OIL) 1200 MG CAPS Take 1,200 mg by mouth daily.   01/02/2023 at 0800   ondansetron (ZOFRAN) 4 MG tablet Take 1 tablet (4 mg total) by mouth every 6 (six) hours as needed for nausea. 20 tablet 0 unk   polyethylene glycol (MIRALAX / GLYCOLAX) 17 g packet Take 17 g by mouth daily as needed. (Patient taking differently: Take 17 g by mouth daily as needed for mild constipation (to be mixed into 6-8 ounces of juice or water).) 14 each 0 unk   rosuvastatin (CRESTOR) 5 MG tablet Take 5 mg by mouth at bedtime.   01/02/2023 at 2000   sertraline (ZOLOFT) 100 MG tablet Take 200 mg by mouth in the morning.    01/02/2023 at 0800   SIMPLY SALINE 0.9 % AERS Place 1 spray into both nostrils 4 (four) times daily.   01/02/2023 at 2000   acetaminophen (TYLENOL) 325 MG tablet Take 2 tablets (650 mg total) by mouth 2 (two) times daily. (Patient not taking:  Reported on 12/30/2022)   Not Taking   aspirin 81 MG chewable tablet Chew 1 tablet (81 mg total) by mouth 2 (two) times daily for 28 days. (Patient not taking: Reported on 12/28/2022) 56 tablet 0 Not Taking   feeding supplement (ENSURE ENLIVE / ENSURE PLUS) LIQD Take 237 mLs by mouth 2 (two) times daily between meals. (Patient not taking: Reported on 01/07/2023) 237 mL 12 Not Taking    Assessment: 82 yo F with new onset AFib w/ RVR.  Pharmacy consulted to dose heparin.   01/06/23 Heparin level 0.29 (subtherapeutic) with heparin infusion at 1350 units/hr CBC stable No bleeding or infusion issues noted  Goal of Therapy:  Heparin level 0.3-0.7 units/ml Monitor platelets by anticoagulation protocol: Yes   Plan:  -Increase heparin infusion to 1450 units/hr -Check 8 hr heparin level after rate increased -Daily CBC & heparin level  Tawnya Crook, PharmD, BCPS Clinical Pharmacist 01/06/2023 2:06 PM

## 2023-01-07 DIAGNOSIS — J189 Pneumonia, unspecified organism: Secondary | ICD-10-CM | POA: Diagnosis not present

## 2023-01-07 DIAGNOSIS — I4719 Other supraventricular tachycardia: Secondary | ICD-10-CM

## 2023-01-07 DIAGNOSIS — I10 Essential (primary) hypertension: Secondary | ICD-10-CM | POA: Diagnosis not present

## 2023-01-07 DIAGNOSIS — J9601 Acute respiratory failure with hypoxia: Secondary | ICD-10-CM | POA: Diagnosis not present

## 2023-01-07 LAB — CBC
HCT: 26.9 % — ABNORMAL LOW (ref 36.0–46.0)
Hemoglobin: 8.5 g/dL — ABNORMAL LOW (ref 12.0–15.0)
MCH: 32 pg (ref 26.0–34.0)
MCHC: 31.6 g/dL (ref 30.0–36.0)
MCV: 101.1 fL — ABNORMAL HIGH (ref 80.0–100.0)
Platelets: 432 10*3/uL — ABNORMAL HIGH (ref 150–400)
RBC: 2.66 MIL/uL — ABNORMAL LOW (ref 3.87–5.11)
RDW: 13.9 % (ref 11.5–15.5)
WBC: 12.2 10*3/uL — ABNORMAL HIGH (ref 4.0–10.5)
nRBC: 0 % (ref 0.0–0.2)

## 2023-01-07 LAB — BASIC METABOLIC PANEL
Anion gap: 12 (ref 5–15)
BUN: 34 mg/dL — ABNORMAL HIGH (ref 8–23)
CO2: 32 mmol/L (ref 22–32)
Calcium: 8 mg/dL — ABNORMAL LOW (ref 8.9–10.3)
Chloride: 98 mmol/L (ref 98–111)
Creatinine, Ser: 0.69 mg/dL (ref 0.44–1.00)
GFR, Estimated: >60 mL/min (ref >60)
Glucose, Bld: 170 mg/dL — ABNORMAL HIGH (ref 70–99)
Potassium: 4.4 mmol/L (ref 3.5–5.1)
Sodium: 142 mmol/L (ref 135–145)

## 2023-01-07 LAB — STREP PNEUMONIAE URINARY ANTIGEN: Strep Pneumo Urinary Antigen: NEGATIVE

## 2023-01-07 LAB — LEGIONELLA PNEUMOPHILA SEROGP 1 UR AG: L. pneumophila Serogp 1 Ur Ag: NEGATIVE

## 2023-01-07 LAB — HEPARIN LEVEL (UNFRACTIONATED): Heparin Unfractionated: 0.44 IU/mL (ref 0.30–0.70)

## 2023-01-07 MED ORDER — DIAZEPAM 5 MG/ML IJ SOLN
2.5000 mg | Freq: Three times a day (TID) | INTRAMUSCULAR | Status: DC
  Administered 2023-01-07 (×2): 2.5 mg via INTRAVENOUS
  Filled 2023-01-07 (×3): qty 2

## 2023-01-07 MED ORDER — ALBUMIN HUMAN 25 % IV SOLN
25.0000 g | Freq: Four times a day (QID) | INTRAVENOUS | Status: AC
  Administered 2023-01-07 – 2023-01-08 (×4): 25 g via INTRAVENOUS
  Filled 2023-01-07 (×4): qty 100

## 2023-01-07 NOTE — Progress Notes (Signed)
Pharmacy Antibiotic Note  Brittany Douglas is a 82 y.o. female admitted on 01/01/2023 with pneumonia. Patient in ICU on BiPAP. Pharmacy has been consulted for cefepime dosing.  Plan: -Continue cefepime 2g IV q12h -Monitor renal function, cultures and clinical course for dose adjustments and de-escalation as indicated   Height: '5\' 2"'$  (157.5 cm) Weight: 56.7 kg (125 lb) IBW/kg (Calculated) : 50.1  Temp (24hrs), Avg:97.9 F (36.6 C), Min:97.6 F (36.4 C), Max:99.1 F (37.3 C)  Recent Labs  Lab 01/09/2023 0825 01/02/2023 1154 01/04/23 1002 01/05/23 0931 01/06/23 0721 01/06/23 1058 01/07/23 0301  WBC 23.0*  --  17.8* 20.4* 20.9*  --  12.2*  CREATININE 0.52  --  0.52 0.59  --  0.59 0.69  LATICACIDVEN 1.3 1.1  --   --   --   --   --      Estimated Creatinine Clearance: 42.9 mL/min (by C-G formula based on SCr of 0.69 mg/dL).    Allergies  Allergen Reactions   Nitrofurantoin Nausea Only and Other (See Comments)    Upsets the stomach and "Allergic," per [facility]   Ace Inhibitors Other (See Comments)    "Allergic," per [facility]   Codeine Nausea And Vomiting and Other (See Comments)    "Allergic," per [facility]   Cymbalta [Duloxetine Hcl] Other (See Comments)    "Allergic," per [facility]   Doxycycline Other (See Comments)    "Allergic," per [facility]   Gabapentin Nausea Only and Other (See Comments)    "Allergic," per [facility]   Hctz [Hydrochlorothiazide] Other (See Comments)    "Allergic," per [facility]   Lisinopril Other (See Comments) and Cough    "Allergic," per [facility]   Other Other (See Comments)    Unnamed B/P tablet- made the patient "pass out"   Penicillins Hives   Latex Rash   Sulfa Antibiotics Rash   Tape Rash and Other (See Comments)    Cloth tape preferred- NO latex tape!!    Antimicrobials this admission: 1/20 Ceftriaxone x 1 1/20 Azithromycin x 1, 1/23 >> 01/17/2023 1/20 Vancomycin >> 1/21 1/20 Cefepime >>  Microbiology results: 1/20 BCx: ngtd 1/20 UCx: 600 MRSE  1/20 MRSA PCR:  negative 1/20 Resp panel: negative  Thank you for allowing pharmacy to be a part of this patient's care.   Tawnya Crook, PharmD, BCPS Clinical Pharmacist 01/07/2023 9:18 AM

## 2023-01-07 NOTE — Progress Notes (Signed)
PROGRESS NOTE  Brittany Douglas  MWU:[id number] DOB: [birth year] DOA: 01/10/2023 PCP: [name], MD   Brief Narrative: Patient is 82 year old female with history of hypothyroidism, hypertension, anxiety/depression, hyperlipidemia, paroxysmal A-fib who presented with shortness of breath.  She was recently discharged to SNF after patient history of right hip fracture and pneumonia.  She finished antibiotics course.  At the nursing facility, she was found to be dyspneic and found to have pneumonia again on the chest x-ray and was placed on 4 L nasal cannula and transferred to the emergency department.  Started on broad spectrum antibiotics.  Assessment & Plan:  Principal Problem:   Acute hypoxic respiratory failure (HCC) Active Problems:   Multifocal pneumonia   Essential hypertension   Hypothyroidism   Anxiety   Depression   Normocytic anemia   Hyperglycemia   Sepsis ([place])  Acute hypoxic respiratory failure: Recent history of pneumonia with finding of worsening pneumonia on admission.  Elevated BNP.  Last echo showed grade 2 diastolic dysfunction.  Patient was on 4 L of oxygen on arrival.  Currently intermittently on BiPAP.  Not on oxygen at home.  Continue to try to wean. PCCM also consulted for worsening respiratory status,now following.On bipap this mrng  Multifocal pneumonia:  Chest x-ray on this admission showed bilateral patchy opacities consistent with multifocal pneumonia.  Started on vancomycin, cefepime: Now: Cefepime and azithromycin,steroids.  Follow-up blood cultures.  Procalcitonin ordered nonassuring.  Has leukocytosis.  Influenza/RSV/COVID-negative.  Acute on chronic diastolic CHF: Last echo on 12/25/2022 showed greater lesser dysfunction.  Elevated BNP on presentation.  Could not rule out pulm edema given x-ray findings of the chest.  Currently on Lasix 40 mg IV every 12.  Cardiology also following.  A-fib with RVR: Started on heparin, Cardizem drip.[id number]  score was at least 4 .  But after review of EKG with cardiology, it was consulted with MAT rather than afib.now on metoprolol.  Currently in normal sinus rhythm.Heparin drip can be d/ced soon,as per cardiology  Hypothyroidism: Continue Synthyroid  Hypertension: Currently on metoprolol  Hyperlipidemia: On rosuvastatin  Anemia: Currently hemoglobin stable  Patient is critically ill now on Precedex drip, BiPAP.  Case discussed with PCCM,they  will be the primary team now.  TRH will sign off       DVT prophylaxis:Place and maintain sequential compression device Start: 01/05/23 1149     Code Status: DNR  Family Communication: daughter at bedside  Patient status:Inpatient  Patient is from :Home  Anticipated discharge to:Not sure  Estimated DC date:Not sure   Consultants: PCCM, cardiology  Procedures: None  Antimicrobials:  Anti-infectives (From admission, onward)    Start     Dose/Rate Route Frequency Ordered Stop   01/06/23 1200  azithromycin (ZITHROMAX) 500 mg in sodium chloride 0.9 % 250 mL IVPB        500 mg 250 mL/hr over 60 Minutes Intravenous Every 24 hours 01/06/23 1046 01/09/23 1159   01/04/23 1200  vancomycin (VANCOREADY) IVPB 750 mg/150 mL  Status:  Discontinued        750 mg 150 mL/hr over 60 Minutes Intravenous Every 24 hours 01/04/2023 1155 01/04/23 1314   12/21/2022 1130  ceFEPIme (MAXIPIME) 2 g in sodium chloride 0.9 % 100 mL IVPB        2 g 200 mL/hr over 30 Minutes Intravenous Every 12 hours 01/07/2023 1116     12/29/2022 1130  vancomycin (VANCOREADY) IVPB 1250 mg/250 mL        1,250 mg 166.7 mL/hr over 90  Minutes Intravenous  Once 12/20/2022 1116 01/02/2023 1356   12/22/2022 0800  cefTRIAXone (ROCEPHIN) 2 g in sodium chloride 0.9 % 100 mL IVPB  Status:  Discontinued        2 g 200 mL/hr over 30 Minutes Intravenous Every 24 hours 12/19/2022 0759 01/12/2023 1101   12/24/2022 0800  azithromycin (ZITHROMAX) 500 mg in sodium chloride 0.9 % 250 mL IVPB  Status:  Discontinued         500 mg 250 mL/hr over 60 Minutes Intravenous Every 24 hours 01/06/2023 0759 01/04/2023 1101       Subjective: Patient seen and examined at bedside today.  Hemodynamically stable.  She was on BiPAP.  Daughter at bedside.  She was tachypneic, in mild to moderate respiratory distress. On precedex drip. Found to be alert and oriented.  Long discussion had at the bedside with the daughter about the current management plan.   Objective: Vitals:   01/07/23 0733 01/07/23 0736 01/07/23 0803 01/07/23 0806  BP:      Pulse:  (!) 44  88  Resp:  (!) 42  (!) 34  Temp: 99.1 F (37.3 C)     TempSrc: Axillary     SpO2:  (!) 87% 95% 94%  Weight:      Height:        Intake/Output Summary (Last 24 hours) at 01/07/2023 0815 Last data filed at 01/07/2023 0700 Gross per 24 hour  Intake 1976.45 ml  Output 1925 ml  Net 51.45 ml   Filed Weights   01/14/2023 0753  Weight: 56.7 kg    Examination:  General exam: On BiPAP, on moderate respite distress Respiratory system: Diminished sounds bilaterally, tachypnea Cardiovascular system: S1 & S2 heard, RRR.  Gastrointestinal system: Abdomen is nondistended, soft and nontender. Central nervous system: Alert and oriented Extremities: No edema, no clubbing ,no cyanosis Skin: No rashes, no ulcers,no icterus     Data Reviewed: I have personally reviewed following labs and imaging studies  CBC: Recent Labs  Lab 12/27/2022 0825 01/04/23 1002 01/05/23 0931 01/06/23 0721 01/07/23 0301  WBC 23.0* 17.8* 20.4* 20.9* 12.2*  NEUTROABS 21.2*  --   --   --   --   HGB 9.2* 9.0* 9.3* 9.2* 8.5*  HCT 27.5* 27.2* 28.8* 28.3* 26.9*  MCV 98.9 98.6 99.3 98.3 101.1*  PLT 593* 598* 667* 642* 782*   Basic Metabolic Panel: Recent Labs  Lab 01/02/2023 0825 01/04/23 1002 01/05/23 0931 01/06/23 1058 01/07/23 0301  NA 134* 137 139 139 142  K 3.7 3.1* 3.4* 3.5 4.4  CL 97* 98 97* 96* 98  CO2 '26 27 28 '$ 32 32  GLUCOSE 163* 121* 171* 203* 170*  BUN '9 14 20 '$ 24* 34*   CREATININE 0.52 0.52 0.59 0.59 0.69  CALCIUM 8.3* 8.0* 8.0* 8.0* 8.0*     Recent Results (from the past 240 hour(s))  Blood Culture (routine x 2)     Status: None (Preliminary result)   Collection Time: 01/14/2023  8:22 AM   Specimen: BLOOD RIGHT WRIST  Result Value Ref Range Status   Specimen Description   Final    BLOOD RIGHT WRIST Performed at [facility], [address], [place], [place] [address]    Special Requests   Final    BOTTLES DRAWN AEROBIC AND ANAEROBIC Blood Culture results may not be optimal due to an excessive volume of blood received in culture bottles Performed at [facility] [address], [place], [place] [address]  Culture   Final    NO GROWTH 4 DAYS Performed at [facility], [place] [address], [place], [place] [address]    Report Status PENDING  Incomplete  Blood Culture (routine x 2)     Status: None (Preliminary result)   Collection Time: 01/02/2023  8:23 AM   Specimen: BLOOD  Result Value Ref Range Status   Specimen Description   Final    BLOOD LEFT ANTECUBITAL Performed at [facility] [address], [place], [place] [address]    Special Requests   Final    BOTTLES DRAWN AEROBIC AND ANAEROBIC Blood Culture results may not be optimal due to an excessive volume of blood received in culture bottles Performed at [facility] [address] [place], [place] [address]    Culture   Final    NO GROWTH 4 DAYS Performed at [facility], [place] [address] [place], [place] [address]    Report Status PENDING  Incomplete  Resp panel by RT-PCR (RSV, Flu A&B, Covid) Anterior Nasal Swab     Status: None   Collection Time: 12/26/2022  8:53 AM   Specimen: Anterior Nasal Swab  Result Value Ref Range Status   SARS Coronavirus 2 by RT PCR NEGATIVE NEGATIVE Final    Comment: (NOTE) SARS-CoV-2 target nucleic acids are NOT DETECTED.  The SARS-CoV-2 RNA is generally detectable in upper  respiratory specimens during the acute phase of infection. The lowest concentration of SARS-CoV-2 viral copies this assay can detect is 138 copies/mL. A negative result does not preclude SARS-Cov-2 infection and should not be used as the sole basis for treatment or other patient management decisions. A negative result may occur with  improper specimen collection/handling, submission of specimen other than nasopharyngeal swab, presence of viral mutation(s) within the areas targeted by this assay, and inadequate number of viral copies(<138 copies/mL). A negative result must be combined with clinical observations, patient history, and epidemiological information. The expected result is Negative.  Fact Sheet for Patients:  EntrepreneurPulse.com.au  Fact Sheet for Healthcare Providers:  IncredibleEmployment.be  This test is no t yet approved or cleared by the [place] FDA and  has been authorized for detection and/or diagnosis of SARS-CoV-2 by FDA under an Emergency Use Authorization (EUA). This EUA will remain  in effect (meaning this test can be used) for the duration of the COVID-19 declaration under Section 564(b)(1) of the Act, 21 U.S.C.section 360bbb-3(b)(1), unless the authorization is terminated  or revoked sooner.       Influenza A by PCR NEGATIVE NEGATIVE Final   Influenza B by PCR NEGATIVE NEGATIVE Final    Comment: (NOTE) The Xpert Xpress SARS-CoV-2/FLU/RSV plus assay is intended as an aid in the diagnosis of influenza from Nasopharyngeal swab specimens and should not be used as a sole basis for treatment. Nasal washings and aspirates are unacceptable for Xpert Xpress SARS-CoV-2/FLU/RSV testing.  Fact Sheet for Patients: EntrepreneurPulse.com.au  Fact Sheet for Healthcare Providers: IncredibleEmployment.be  This test is not yet approved or cleared by the [place] FDA and has been  authorized for detection and/or diagnosis of SARS-CoV-2 by FDA under an Emergency Use Authorization (EUA). This EUA will remain in effect (meaning this test can be used) for the duration of the COVID-19 declaration under Section 564(b)(1) of the Act, 21 U.S.C. section 360bbb-3(b)(1), unless the authorization is terminated or revoked.     Resp Syncytial Virus by PCR NEGATIVE NEGATIVE Final    Comment: (NOTE) Fact Sheet  for Patients: EntrepreneurPulse.com.au  Fact Sheet for Healthcare Providers: IncredibleEmployment.be  This test is not yet approved or cleared by the [place] FDA and has been authorized for detection and/or diagnosis of SARS-CoV-2 by FDA under an Emergency Use Authorization (EUA). This EUA will remain in effect (meaning this test can be used) for the duration of the COVID-19 declaration under Section 564(b)(1) of the Act, 21 U.S.C. section 360bbb-3(b)(1), unless the authorization is terminated or revoked.  Performed at [facility], [place] [address], [place], [place] [address]   MRSA Next Gen by PCR, Nasal     Status: None   Collection Time: 01/02/2023 10:52 AM   Specimen: Nasal Mucosa; Nasal Swab  Result Value Ref Range Status   MRSA by PCR Next Gen NOT DETECTED NOT DETECTED Final    Comment: (NOTE) The GeneXpert MRSA Assay (FDA approved for NASAL specimens only), is one component of a comprehensive MRSA colonization surveillance program. It is not intended to diagnose MRSA infection nor to guide or monitor treatment for MRSA infections. Test performance is not FDA approved in patients less than 77 years old. Performed at [facility], [place] [address], [place], [place] [address]   Urine Culture     Status: Abnormal   Collection Time: 01/09/2023 12:13 PM   Specimen: In/Out Cath Urine  Result Value Ref Range Status   Specimen Description   Final    IN/OUT CATH URINE Performed at [facility] [address] [place], [place] [address]    Special Requests   Final    NONE Performed at [facility], [place] [address], [place], [place] [address]    Culture 600 COLONIES/mL STAPHYLOCOCCUS EPIDERMIDIS (A)  Final   Report Status 01/06/2023 FINAL  Final   Organism ID, Bacteria STAPHYLOCOCCUS EPIDERMIDIS (A)  Final      Susceptibility   Staphylococcus epidermidis - MIC*    CIPROFLOXACIN >=8 RESISTANT Resistant     GENTAMICIN >=16 RESISTANT Resistant     NITROFURANTOIN <=16 SENSITIVE Sensitive     OXACILLIN >=4 RESISTANT Resistant     TETRACYCLINE >=16 RESISTANT Resistant     VANCOMYCIN 2 SENSITIVE Sensitive     TRIMETH/SULFA 80 RESISTANT Resistant     CLINDAMYCIN >=8 RESISTANT Resistant     RIFAMPIN <=0.5 SENSITIVE Sensitive     Inducible Clindamycin NEGATIVE Sensitive     * 600 COLONIES/mL STAPHYLOCOCCUS EPIDERMIDIS  Respiratory (~20 pathogens) panel by PCR     Status: None   Collection Time: 01/06/23  1:57 PM   Specimen: Nasopharyngeal Swab; Respiratory  Result Value Ref Range Status   Adenovirus NOT DETECTED NOT DETECTED Final   Coronavirus 229E NOT DETECTED NOT DETECTED Final    Comment: (NOTE) The Coronavirus on the Respiratory Panel, DOES NOT test for the novel  Coronavirus (2019 nCoV)    Coronavirus HKU1 NOT DETECTED NOT DETECTED Final   Coronavirus NL63 NOT DETECTED NOT DETECTED Final   Coronavirus OC43 NOT DETECTED NOT DETECTED Final   Metapneumovirus NOT DETECTED NOT DETECTED Final   Rhinovirus / Enterovirus NOT DETECTED NOT DETECTED Final   Influenza A NOT DETECTED NOT DETECTED Final   Influenza B NOT DETECTED NOT DETECTED Final   Parainfluenza Virus 1 NOT DETECTED NOT DETECTED Final   Parainfluenza Virus 2 NOT DETECTED NOT DETECTED Final   Parainfluenza Virus 3 NOT DETECTED NOT DETECTED Final   Parainfluenza Virus 4 NOT DETECTED NOT DETECTED Final   Respiratory Syncytial Virus NOT DETECTED NOT DETECTED  Final    Bordetella pertussis NOT DETECTED NOT DETECTED Final   Bordetella Parapertussis NOT DETECTED NOT DETECTED Final   Chlamydophila pneumoniae NOT DETECTED NOT DETECTED Final   Mycoplasma pneumoniae NOT DETECTED NOT DETECTED Final    Comment: Performed at [facility], [place] [address] [place], [place] [address]     Radiology Studies: DG Chest [place] 1V same Day  Result Date: 01/06/2023 CLINICAL DATA:  Pneumonia EXAM: PORTABLE CHEST 1 VIEW COMPARISON:  12/26/2022 FINDINGS: The heart size and mediastinal diffuse interstitial and alveolar opacity consistent with worsening pulmonary edema and/or diffuse bilateral pneumonia. There is suggestion of small pleural effusions. No pneumothorax identified. Calcified aorta. IMPRESSION: Diffuse interstitial and alveolar opacities with interval worsening. Electronically Signed   By: Sammie Bench M.D.   On: 01/06/2023 08:36    Scheduled Meds:  aspirin  81 mg Oral BID   Chlorhexidine Gluconate Cloth  6 each Topical Daily   furosemide  40 mg Intravenous Q8H   ipratropium  0.5 mg Nebulization Q6H   levalbuterol  0.63 mg Nebulization Q6H   levothyroxine  25 mcg Oral Once per day on Mon Tue Wed Thu Fri   [START ON 01/10/2023] levothyroxine  37.5 mcg Oral Once per day on Sun Sat   methylPREDNISolone (SOLU-MEDROL) injection  40 mg Intravenous Q12H   metoprolol tartrate  50 mg Oral BID   mouth rinse  15 mL Mouth Rinse 4 times per day   rosuvastatin  5 mg Oral QHS   Continuous Infusions:  azithromycin Stopped (01/06/23 1605)   ceFEPime (MAXIPIME) IV Stopped (01/07/23 0103)   dexmedetomidine (PRECEDEX) IV infusion 0.6 mcg/kg/hr (01/07/23 0700)   heparin 1,450 Units/hr (01/07/23 0700)     LOS: 4 days   Shelly Coss, MD [facility] P1/24/2024, 8:15 AM

## 2023-01-07 NOTE — Progress Notes (Signed)
Rounding Note    Patient Name: Brittany Douglas Date of Encounter: 01/07/2023  [place] Cardiologist: Larae Grooms, MD   Subjective   Patient acutely ill appearing in bed on BiPAP this morning. She is able to answer/yes no questions. Denies chest pain or palpitations. Patient's daughter is also at bedside (of note, she is a family Designer, jewellery). She reports seeing clinical improvement in her mother from yesterday. Discussed plans for ongoing diuresis and that heparin is not needed for stroke prophylaxis as patient not in afib.  Inpatient Medications    Scheduled Meds:  aspirin  81 mg Oral BID   Chlorhexidine Gluconate Cloth  6 each Topical Daily   furosemide  40 mg Intravenous Q8H   ipratropium  0.5 mg Nebulization Q6H   levalbuterol  0.63 mg Nebulization Q6H   levothyroxine  25 mcg Oral Once per day on Mon Tue Wed Thu Fri   [START ON 01/10/2023] levothyroxine  37.5 mcg Oral Once per day on Sun Sat   methylPREDNISolone (SOLU-MEDROL) injection  40 mg Intravenous Q12H   metoprolol tartrate  50 mg Oral BID   mouth rinse  15 mL Mouth Rinse 4 times per day   rosuvastatin  5 mg Oral QHS   Continuous Infusions:  azithromycin Stopped (01/06/23 1605)   ceFEPime (MAXIPIME) IV Stopped (01/07/23 0103)   dexmedetomidine (PRECEDEX) IV infusion 0.6 mcg/kg/hr (01/07/23 0700)   heparin 1,450 Units/hr (01/07/23 0700)   PRN Meds: acetaminophen **OR** acetaminophen, diazepam, hydrALAZINE, levalbuterol, ondansetron **OR** ondansetron (ZOFRAN) IV, mouth rinse   Vital Signs    Vitals:   01/07/23 0630 01/07/23 0632 01/07/23 0733 01/07/23 0736  BP:      Pulse: 99   (!) 44  Resp:  (!) 36  (!) 42  Temp:   99.1 F (37.3 C)   TempSrc:   Axillary   SpO2:  (!) 88%  (!) 87%  Weight:      Height:        Intake/Output Summary (Last 24 hours) at 01/07/2023 0801 Last data filed at 01/07/2023 0700 Gross per 24 hour  Intake 1976.45 ml  Output 1925 ml  Net 51.45 ml       12/20/2022    7:53 AM 12/19/2022   11:21 PM 07/08/2022   11:41 AM  Last 3 Weights  Weight (lbs) 125 lb 125 lb 123 lb  Weight (kg) 56.7 kg 56.7 kg 55.792 kg      Telemetry    Sinus rhythm with multi-focal atrial tachycardia. Frequent PACs as well - Personally Reviewed  ECG    No new tracing. Repeat ordered - Personally Reviewed  Physical Exam   GEN: Acutely ill appearing, on BiPAP Neck: JVD midway to mandible Cardiac: slightly irregular rhythm, no murmurs, rubs, or gallops.  Respiratory: Diffuse crackles, rhonchi bilaterally GI: Soft, nontender, non-distended  MS: No edema; No deformity. Neuro:  Nonfocal  Psych: difficult to assess with BiPAP  Labs    High Sensitivity Troponin:  No results for input(s): "TROPONINIHS" in the last 720 hours.   Chemistry Recent Labs  Lab 12/25/2022 0825 01/04/23 1002 01/05/23 0931 01/06/23 1058 01/07/23 0301  NA 134* 137 139 139 142  K 3.7 3.1* 3.4* 3.5 4.4  CL 97* 98 97* 96* 98  CO2 '26 27 28 '$ 32 32  GLUCOSE 163* 121* 171* 203* 170*  BUN '9 14 20 '$ 24* 34*  CREATININE 0.52 0.52 0.59 0.59 0.69  CALCIUM 8.3* 8.0* 8.0* 8.0* 8.0*  PROT 6.7 6.5  --   --   --  ALBUMIN 2.7* 2.6*  --   --   --   AST 29 40  --   --   --   ALT 24 33  --   --   --   ALKPHOS 90 130*  --   --   --   BILITOT 0.5 0.6  --   --   --   GFRNONAA >60 >60 >60 >60 >60  ANIONGAP '11 12 14 11 12    '$ Lipids No results for input(s): "CHOL", "TRIG", "HDL", "LABVLDL", "LDLCALC", "CHOLHDL" in the last 168 hours.  Hematology Recent Labs  Lab 01/05/23 0931 01/06/23 0721 01/07/23 0301  WBC 20.4* 20.9* 12.2*  RBC 2.90* 2.88* 2.66*  HGB 9.3* 9.2* 8.5*  HCT 28.8* 28.3* 26.9*  MCV 99.3 98.3 101.1*  MCH 32.1 31.9 32.0  MCHC 32.3 32.5 31.6  RDW 13.5 13.5 13.9  PLT 667* 642* 432*   Thyroid  Recent Labs  Lab 01/06/23 1134  TSH 2.872    BNP Recent Labs  Lab 12/16/2022 1154 01/06/23 1058  BNP 752.5* 768.5*    DDimer  Recent Labs  Lab 01/06/2023 1154  DDIMER 2.91*      Radiology    DG Chest Port 1V same Day  Result Date: 01/06/2023 CLINICAL DATA:  Pneumonia EXAM: PORTABLE CHEST 1 VIEW COMPARISON:  01/13/2023 FINDINGS: The heart size and mediastinal diffuse interstitial and alveolar opacity consistent with worsening pulmonary edema and/or diffuse bilateral pneumonia. There is suggestion of small pleural effusions. No pneumothorax identified. Calcified aorta. IMPRESSION: Diffuse interstitial and alveolar opacities with interval worsening. Electronically Signed   By: Sammie Bench M.D.   On: 01/06/2023 08:36    Cardiac Studies   12/25/22 TTE  IMPRESSIONS     1. Left ventricular ejection fraction, by estimation, is 65%. The left  ventricle has normal function. Left ventricular endocardial border not  optimally defined to evaluate regional wall motion. There is mild left  ventricular hypertrophy. Left  ventricular diastolic parameters are consistent with Grade I diastolic  dysfunction (impaired relaxation).   2. Right ventricular systolic function is normal. The right ventricular  size is normal. There is mildly elevated pulmonary artery systolic  pressure.   3. The mitral valve is grossly normal. No evidence of mitral valve  regurgitation. The mean mitral valve gradient is 2.0 mmHg with average  heart rate of 86 bpm. Severe mitral annular calcification.   4. The aortic valve was not well visualized. There is mild calcification  of the aortic valve. Aortic valve regurgitation is not visualized. Aortic  valve sclerosis is present, with no evidence of aortic valve stenosis.   5. The inferior vena cava is normal in size with greater than 50%  respiratory variability, suggesting right atrial pressure of 3 mmHg.   Comparison(s): No prior Echocardiogram.   FINDINGS   Left Ventricle: Left ventricular ejection fraction, by estimation, is  65%. The left ventricle has normal function. Left ventricular endocardial  border not optimally defined to  evaluate regional wall motion. The left  ventricular internal cavity size was  small. There is mild left ventricular hypertrophy. Left ventricular  diastolic parameters are consistent with Grade I diastolic dysfunction  (impaired relaxation).   Right Ventricle: The right ventricular size is normal. No increase in  right ventricular wall thickness. Right ventricular systolic function is  normal. There is mildly elevated pulmonary artery systolic pressure. The  tricuspid regurgitant velocity is 3.00   m/s, and with an assumed right atrial pressure of 3 mmHg, the  estimated  right ventricular systolic pressure is 22.9 mmHg.   Left Atrium: Left atrial size was normal in size.   Right Atrium: Right atrial size was normal in size.   Pericardium: There is no evidence of pericardial effusion.   Mitral Valve: The mitral valve is grossly normal. Severe mitral annular  calcification. No evidence of mitral valve regurgitation. MV peak  gradient, 8.6 mmHg. The mean mitral valve gradient is 2.0 mmHg with  average heart rate of 86 bpm.   Tricuspid Valve: The tricuspid valve is normal in structure. Tricuspid  valve regurgitation is mild . No evidence of tricuspid stenosis.   Aortic Valve: The aortic valve was not well visualized. There is mild  calcification of the aortic valve. There is mild aortic valve annular  calcification. Aortic valve regurgitation is not visualized. Aortic valve  sclerosis is present, with no evidence  of aortic valve stenosis. Aortic valve mean gradient measures 6.0 mmHg.  Aortic valve peak gradient measures 13.1 mmHg. Aortic valve area, by VTI  measures 2.10 cm.   Pulmonic Valve: The pulmonic valve was not well visualized. Pulmonic valve  regurgitation is not visualized. No evidence of pulmonic stenosis.   Aorta: The aortic root is normal in size and structure.   Venous: The inferior vena cava is normal in size with greater than 50%  respiratory variability,  suggesting right atrial pressure of 3 mmHg.   IAS/Shunts: No atrial level shunt detected by color flow Doppler.    Patient Profile     KAMYLAH MANZO is a 82 y.o. female with a hx of HTN, HLD, anxiety, hypothyrodism,  who is being seen 01/06/2023 for the evaluation of A fib at the request of Dr Darrick Meigs.   Assessment & Plan    Atrial arrhythmia  Cardiology consulted on 1/23 due to irregularly irregular rhythm noted on telemetry and ECG. After careful review, noted to be multifocal atrial tachycardia rather than afib in the setting of severe multifocal pneumonia/respiratory failure.   Patient with relatively stable rates on Metoprolol Tartrate '50mg'$  BID. Though patient is hypertensive, would prefer cardio-selective beta blocker given significant pulmonary pathology. If patient has limited PO status due to BiPAP, IV lopressor would be reasonable to maintain stable rates. No indication for anticoagulation at this time, afib dose heparin can be discontinued. Switch to DVT prophylaxis dosing. Repeat ECG ordered this AM.  Acute diastolic CHF  Patient admitted with severe pneumonia/respiratory failure noted to have BNP elevated at 768. 1/11 TTE with LVEF 79%, grade I diastolic dysfunction.   Patient with some JVP elevation noted. Some of this may be a result of ongoing BiPAP use and resulting increased intrathoracic pressure. Suspect elevated BNP and abnormal pulmonary exam are largely secondary to severe pneumonia. However, given the severity of illness and unusual appearing CXR/chest CT, very reasonable to continue diuresis with lasix '40mg'$  IV TID along with infection treatment. Creatinine remains stable though BUN is rising, 24->34 and patient may be approaching euvolemic status.  Hypertension  Patient has remained hypertensive throughout this admission. With plans for aggressive diuresis, would take conservative approach to BP reduction. Avoid non-cardioselective beta blockers. Her stable  creatinine would support resuming of home ARB if patient's clinical condition improves enough to warrant tighter BP control.      For questions or updates, please contact [place] Please consult [email or web address] for contact info under        Signed, Lily Kocher, PA-C  01/07/2023, 8:01 AM

## 2023-01-07 NOTE — Progress Notes (Signed)
ANTICOAGULATION CONSULT NOTE - Follow-Up Consult  Pharmacy Consult for heparin Indication: atrial fibrillation  Allergies  Allergen Reactions   Nitrofurantoin Nausea Only and Other (See Comments)    Upsets the stomach and "Allergic," per [facility]   Ace Inhibitors Other (See Comments)    "Allergic," per [facility]   Codeine Nausea And Vomiting and Other (See Comments)    "Allergic," per [facility]   Cymbalta [Duloxetine Hcl] Other (See Comments)    "Allergic," per [facility]   Doxycycline Other (See Comments)    "Allergic," per [facility]   Gabapentin Nausea Only and Other (See Comments)    "Allergic," per [facility]   Hctz [Hydrochlorothiazide] Other (See Comments)    "Allergic," per [facility]   Lisinopril Other (See Comments) and Cough    "Allergic," per [facility]   Other Other (See Comments)    Unnamed B/P tablet- made the patient "pass out"   Penicillins Hives   Latex Rash   Sulfa Antibiotics Rash   Tape Rash and Other (See Comments)    Cloth tape preferred- NO latex tape!!    Patient Measurements: Height: '5\' 2"'$  (157.5 cm) Weight: 56.7 kg (125 lb) IBW/kg (Calculated) : 50.1 Heparin Dosing Weight: actual body weight  Vital Signs: Temp: 97.6 F (36.4 C) (01/23 2300) Temp Source: Axillary (01/23 2300) BP: 113/50 (01/24 0200) Pulse Rate: 75 (01/24 0243)  Labs: Recent Labs    01/05/23 0931 01/05/23 2204 01/06/23 0721 01/06/23 1058 01/06/23 1747 01/07/23 0301  HGB 9.3*  --  9.2*  --   --  8.5*  HCT 28.8*  --  28.3*  --   --  26.9*  PLT 667*  --  642*  --   --  432*  HEPARINUNFRC <0.10*   < > 0.29*  --  0.35 0.44  CREATININE 0.59  --   --  0.59  --  0.69   < > = values in this interval not displayed.     Estimated Creatinine Clearance: 42.9 mL/min (by C-G formula based on SCr of 0.69 mg/dL).   Medical History: Past Medical History:  Diagnosis Date   Anxiety    Aortic atherosclerosis (HCC)    Avascular necrosis of femur, right (HCC)    Balance problems    Chronic kidney disease, stage II (mild)     DDD (degenerative disc disease), lumbosacral    Depression    DJD (degenerative joint disease), lumbar    Elevated cholesterol    GERD (gastroesophageal reflux disease)    HOH (hard of hearing)    Hypertension    Hypertensive renal disease, benign    Hypokalemia    Hyponatremia    Hypothyroidism    IBS (irritable bowel syndrome)    Major depression, recurrent (HCC)    Osteopenia    Osteoporosis    Post-menopausal    Reflux    Subclinical hypothyroidism    Varicose vein of leg    Vitamin D deficiency     Assessment: 82 yo F with new onset AFib w/ RVR.  Pharmacy consulted to dose heparin.   01/07/23 Confirmatory Heparin level 0.44 units/mL,  therapeutic on 1450 units/hr Hgb low but stable, plts high No bleeding per RN  Goal of Therapy:  Heparin level 0.3-0.7 units/ml Monitor platelets by anticoagulation protocol: Yes   Plan:  -Continue heparin infusion at 1450 units/hr -Daily CBC, heparin level -Monitor closely for s/sx of bleeding -F/u cardiology recommendations regarding long-term anticoagulation  Dolly Rias RPh 01/07/2023, 4:20 AM

## 2023-01-07 NOTE — Progress Notes (Signed)
NAME:  Brittany Douglas, MRN:  [id number], DOB:  August 22, 1941, LOS: 4 ADMISSION DATE:  01/01/2023, CONSULTATION DATE:  01/07/23 REFERRING MD:  Darrick Meigs, CHIEF COMPLAINT:  worsening pneumonia   History of Present Illness:  Brittany Douglas is a 82 y.o. F with PMH of HTN, HL, DJD, CKD stage II with recent admission 1/6-1/11 after a fall and subsequent R hip fracture s/p arthroplasty on 1/7.  She was diagnosed with pneumonia during that admission and discharge with Azithromycin and Omnicef for a total of 5 days to SNF.   She was feeling improved until a few days prior to admission when she had worsening cough and shortness of breath so presented to  the ED on 1/20.  CTA was negative for PE, but showed increasing progressive ground glass opacities and consolidation.  She was admitted and initially treated with Vanc/Cefepime which was narrowed to Cefepime after MRSA was negative.  Since admission her CXR was worsened and she has required Bipap along with developing Afib RVR.   PCCM consulted in this setting, she is DNR.  Pt denies any fever or chills, she is coughing up some sputum.   No chest pain or history of smoking, she worked as an Optometrist.   Pertinent  Medical History   has a past medical history of Anxiety, Aortic atherosclerosis ([place]), Avascular necrosis of femur, right ([place]), Balance problems, Chronic kidney disease, stage II (mild), DDD (degenerative disc disease), lumbosacral, Depression, DJD (degenerative joint disease), lumbar, Elevated cholesterol, GERD (gastroesophageal reflux disease), HOH (hard of hearing), Hypertension, Hypertensive renal disease, benign, Hypokalemia, Hyponatremia, Hypothyroidism, IBS (irritable bowel syndrome), Major depression, recurrent ([place]), Osteopenia, Osteoporosis, Post-menopausal, Reflux, Subclinical hypothyroidism, Varicose vein of leg, and Vitamin D deficiency.   Significant Hospital Events: Including procedures, antibiotic start and stop dates in addition  to other pertinent events   1/20 admit to [facility] with pneumonia 1/23 PCCM consult due to worsening dyspnea 1/24 difficulty tolerating bipap overnight, remains on precedex  Interim History / Subjective:  Desaturating to high 80%'s overnight and difficulty tolerating Bipap Appears fatigued this morning, settings increased to 12/7 50%  Objective   Blood pressure (!) 197/54, pulse (!) 44, temperature 99.1 F (37.3 C), temperature source Axillary, resp. rate (!) 42, height '5\' 2"'$  (1.575 m), weight 56.7 kg, SpO2 (!) 87 %.    FiO2 (%):  [40 %-50 %] 40 %   Intake/Output Summary (Last 24 hours) at 01/07/2023 0804 Last data filed at 01/07/2023 0700 Gross per 24 hour  Intake 1976.45 ml  Output 1925 ml  Net 51.45 ml    Filed Weights   01/14/2023 0753  Weight: 56.7 kg    General:  frail, elderly F resting in bed on bipap HEENT: MM pink/moist Neuro: opens eyes to voice and nods to questions, otherwise appears very fatigued and does not engage CV: s1s2 rrr, no m/r/g PULM:  crackles bilaterally in upper and lower lung fields, currently tolerating bipap  GI: soft, bsx4 active  Extremities: warm/dry, no edema  Skin: no rashes or lesions   Resolved Hospital Problem list     Assessment & Plan:     Acute Hypoxic Respiratory Failure secondary to multifocal pneumonia HFpEF  Anxiety HCAP in the setting of recent admission and worse after outpatient abx Covid, RSV, flu negative Worsening on Cefepime, requiring bipap Procal 0.12  Has been diuresed, put out an additional 2L yesterday -treating with antibiotics and steroids, supportive care to see if pt improved -RVP negative, urine strep and legionella -sputum culture pending -continue  solumedrol '40mg'$ , azithromycin and cefepime   -start precedex gtt to assist with tolerating Bipap mask -Pt is DNR, consult palliative care to assist with further [place] discussions   Atrial Fibrillation Apparently new this admission Cardiology consulted  Echo  with preserved EF, currently hypertensive - cardiology recommends continue metoprolol, IV lopressor if needed, no indication for anticoagulation at this time    *Rest of plan per primary team.  Thank you for this consult, PCCM will continue to follow with you   Best Practice (right click and "Reselect all SmartList Selections" daily)   Diet/type: clear liquids DVT prophylaxis: prophylactic heparin  GI prophylaxis: N/A Lines: N/A Foley:  N/A Code Status:  DNR Last date of multidisciplinary goals of care discussion {updated pt's daughter at the bedside]  Labs   CBC: Recent Labs  Lab 12/22/2022 0825 01/04/23 1002 01/05/23 0931 01/06/23 0721 01/07/23 0301  WBC 23.0* 17.8* 20.4* 20.9* 12.2*  NEUTROABS 21.2*  --   --   --   --   HGB 9.2* 9.0* 9.3* 9.2* 8.5*  HCT 27.5* 27.2* 28.8* 28.3* 26.9*  MCV 98.9 98.6 99.3 98.3 101.1*  PLT 593* 598* 667* 642* 432*     Basic Metabolic Panel: Recent Labs  Lab 01/01/2023 0825 01/04/23 1002 01/05/23 0931 01/06/23 1058 01/07/23 0301  NA 134* 137 139 139 142  K 3.7 3.1* 3.4* 3.5 4.4  CL 97* 98 97* 96* 98  CO2 '26 27 28 '$ 32 32  GLUCOSE 163* 121* 171* 203* 170*  BUN '9 14 20 '$ 24* 34*  CREATININE 0.52 0.52 0.59 0.59 0.69  CALCIUM 8.3* 8.0* 8.0* 8.0* 8.0*    GFR: Estimated Creatinine Clearance: 42.9 mL/min (by C-G formula based on SCr of 0.69 mg/dL). Recent Labs  Lab 01/12/2023 0825 01/05/2023 1154 01/04/23 1002 01/05/23 0931 01/06/23 0721 01/07/23 0301  WBC 23.0*  --  17.8* 20.4* 20.9* 12.2*  LATICACIDVEN 1.3 1.1  --   --   --   --      Liver Function Tests: Recent Labs  Lab 01/04/2023 0825 01/04/23 1002  AST 29 40  ALT 24 33  ALKPHOS 90 130*  BILITOT 0.5 0.6  PROT 6.7 6.5  ALBUMIN 2.7* 2.6*    No results for input(s): "LIPASE", "AMYLASE" in the last 168 hours. No results for input(s): "AMMONIA" in the last 168 hours.  ABG    Component Value Date/Time   HCO3 28.9 (H) 12/29/2022 0825   O2SAT 87.1 12/15/2022 0825      Coagulation Profile: Recent Labs  Lab 12/26/2022 0825  INR 1.3*     Cardiac Enzymes: No results for input(s): "CKTOTAL", "CKMB", "CKMBINDEX", "TROPONINI" in the last 168 hours.  HbA1C: Hgb A1c MFr Bld  Date/Time Value Ref Range Status  12/31/2022 08:42 AM 5.7 (H) 4.8 - 5.6 % Final    Comment:    (NOTE) Pre diabetes:          5.7%-6.4%  Diabetes:              >6.4%  Glycemic control for   <7.0% adults with diabetes     CBG: No results for input(s): "GLUCAP" in the last 168 hours.  Review of Systems:   Please see the history of present illness. All other systems reviewed and are negative    Past Medical History:  She,  has a past medical history of Anxiety, Aortic atherosclerosis ([place]), Avascular necrosis of femur, right ([place]), Balance problems, Chronic kidney disease, stage II (mild), DDD (degenerative disc disease), lumbosacral, Depression,  DJD (degenerative joint disease), lumbar, Elevated cholesterol, GERD (gastroesophageal reflux disease), HOH (hard of hearing), Hypertension, Hypertensive renal disease, benign, Hypokalemia, Hyponatremia, Hypothyroidism, IBS (irritable bowel syndrome), Major depression, recurrent ([place]), Osteopenia, Osteoporosis, Post-menopausal, Reflux, Subclinical hypothyroidism, Varicose vein of leg, and Vitamin D deficiency.   Surgical History:   Past Surgical History:  Procedure Laterality Date   BREAST EXCISIONAL BIOPSY Bilateral    BREAST SURGERY     Benign breast lumps   CATARACT EXTRACTION     CHOLECYSTECTOMY     FOOT SURGERY     TOTAL HIP ARTHROPLASTY Right 12/21/2022   Procedure: TOTAL HIP ARTHROPLASTY ANTERIOR APPROACH;  Surgeon: Paralee Cancel, MD;  Location: [facility];  Service: Orthopedics;  Laterality: Right;   VAGINAL HYSTERECTOMY     A & P repair     Social History:   reports that she has never smoked. She has never used smokeless tobacco. She reports that she does not drink alcohol and does not use drugs.   Family History:  Her  family history includes Breast cancer in her maternal aunt; Colon polyps in her daughter and son; Diabetes in her maternal grandmother; Heart attack in her brother and paternal grandmother; Hyperlipidemia in her sister; Hypertension in her mother; Other in her grandson; Stroke in her brother, father, and mother; Thyroid disease in her sister; Uterine cancer in her mother.   Allergies Allergies  Allergen Reactions   Nitrofurantoin Nausea Only and Other (See Comments)    Upsets the stomach and "Allergic," per [facility]   Ace Inhibitors Other (See Comments)    "Allergic," per [facility]   Codeine Nausea And Vomiting and Other (See Comments)    "Allergic," per [facility]   Cymbalta [Duloxetine Hcl] Other (See Comments)    "Allergic," per [facility]   Doxycycline Other (See Comments)    "Allergic," per [facility]   Gabapentin Nausea Only and Other (See Comments)    "Allergic," per [facility]   Hctz [Hydrochlorothiazide] Other (See Comments)    "Allergic," per [facility]   Lisinopril Other (See Comments) and Cough    "Allergic," per [facility]   Other Other (See Comments)    Unnamed B/P tablet- made the patient "pass out"   Penicillins Hives   Latex Rash   Sulfa Antibiotics Rash   Tape Rash and Other (See Comments)    Cloth tape preferred- NO latex tape!!     Home Medications  Prior to Admission medications   Medication Sig Start Date End Date Taking? Authorizing Provider  acetaminophen (TYLENOL) 325 MG tablet Take 1-2 tablets (325-650 mg total) by mouth every 6 (six) hours as needed for mild pain (pain score 1-3 or temp > 100.5). Patient taking differently: Take 650 mg by mouth 3 (three) times daily. 12/25/22  Yes Shalhoub, Sherryll Burger, MD  ascorbic acid (VITAMIN C) 500 MG tablet Take 500 mg by mouth in the morning.   Yes [provider]  aspirin 81 MG tablet Take 1 tablet (81 mg total) by mouth at bedtime. Begin daily aspirin regimen after 28-day course of twice daily aspirin is complete. Patient taking differently: Take 81 mg by  mouth See admin instructions. Starting on 01/01/2023, take 81 mg by mouth at 8 AM and 8 PM for 2 weeks 12/25/22  Yes Shalhoub, Sherryll Burger, MD  B Complex Vitamins (VITAMIN-B COMPLEX) TABS Take 1 tablet by mouth daily with breakfast.   Yes [provider]  CALCIUM CITRATE+D3 PETITES 200-6.25 MG-MCG TABS Take 2 tablets by mouth daily.   Yes [provider]  Cholecalciferol (VITAMIN D3) 50 MCG (2000 UT) TABS Take 2,000 Units by mouth every Monday, Wednesday, and Friday.   Yes [provider]  Coenzyme Q10 (CO Q 10) 100 MG CAPS Take 200 mg by mouth daily.   Yes [provider]  conjugated estrogens (PREMARIN) vaginal cream INSERT 1/4 APPLICATOR VAGINALLY 2 TIMES A WEEK AS DIRECTED Patient taking differently: Place 1.44 Applicatorfuls vaginally See admin instructions. Place 3.15 applicator vaginally once a day only on Tuesdays and Fridays 11/25/22  Yes Wallace, Tiffany A, NP  diazepam (VALIUM) 2 MG tablet Take 1 mg by mouth every 6 (six) hours as needed for anxiety.   Yes [provider]  Flaxseed, Linseed, (FLAX SEEDS) POWD Take 15 g by mouth daily with breakfast.   Yes [provider]  levofloxacin (LEVAQUIN) 750 MG tablet Take 750 mg by mouth daily. 01/12/2023 01/10/23 Yes [provider]  levothyroxine (SYNTHROID) 25 MCG tablet Take 25-37.5 mcg by mouth See admin instructions. Take 37.5 mcg by mouth before breakfast on Sun/Sat and 25 mcg on Mon/Tues/Wed/Thurs/Fri 05/22/22  Yes [provider]  loratadine (CLARITIN) 10 MG tablet Take 10 mg by mouth at bedtime.   Yes [provider]  losartan (COZAAR) 100 MG tablet Take 100 mg by mouth See admin instructions. Take 100 mg by mouth in the morning and notify the MD if the Systolic number is <40 or >086 OR Diastolic number is <76 or >100   Yes [provider]  methocarbamol (ROBAXIN) 500 MG tablet Take 1 tablet (500 mg total) by mouth every 6 (six) hours as needed for muscle  spasms. 12/25/22  Yes Shalhoub, Sherryll Burger, MD  Multiple Vitamin (MULTIVITAMIN) tablet Take 1 tablet by mouth daily with breakfast.   Yes [provider]  NON FORMULARY Take by mouth See admin instructions. MedPass 2.0- As directed 2 times a day for nutritional supplement   Yes [provider]  Omega-3 Fatty Acids (FISH OIL) 1200 MG CAPS Take 1,200 mg by mouth daily.   Yes [provider]  ondansetron (ZOFRAN) 4 MG tablet Take 1 tablet (4 mg total) by mouth every 6 (six) hours as needed for nausea. 12/25/22  Yes Shalhoub, Sherryll Burger, MD  polyethylene glycol (MIRALAX / GLYCOLAX) 17 g packet Take 17 g by mouth daily as needed. Patient taking differently: Take 17 g by mouth daily as needed for mild constipation (to be mixed into 6-8 ounces of juice or water). 12/25/22  Yes Shalhoub, Sherryll Burger, MD  rosuvastatin (CRESTOR) 5 MG tablet Take 5 mg by mouth at bedtime.   Yes [provider]  sertraline (ZOLOFT) 100 MG tablet Take 200 mg by mouth in the morning.   Yes [provider]  SIMPLY SALINE 0.9 % AERS Place 1 spray into both nostrils 4 (four) times daily.   Yes [provider]  acetaminophen (TYLENOL) 325 MG tablet Take 2 tablets (650 mg total) by mouth 2 (two) times daily. Patient not taking: Reported on 01/10/2023 12/25/22   Vernelle Emerald, MD  aspirin 81 MG chewable tablet Chew 1 tablet (81 mg total) by mouth 2 (two) times daily for 28 days. Patient not taking: Reported on 01/12/2023 12/25/22 01/22/23  Vernelle Emerald, MD  feeding supplement (ENSURE ENLIVE / ENSURE PLUS) LIQD Take 237 mLs by mouth 2 (two) times daily between meals. Patient not taking: Reported on 12/15/2022 12/25/22   Vernelle Emerald, MD     Critical care time:  38 minutes  CRITICAL CARE Performed by: Otilio Carpen Rolanda Campa   Total critical care time: 38 minutes  Critical care time was exclusive of separately billable procedures and treating other patients.  Critical care was  necessary to treat or prevent imminent or life-threatening deterioration.  Critical care was time spent personally by me on the following activities: development of treatment plan with patient and/or surrogate as well as nursing, discussions with consultants, evaluation of patient's response to treatment, examination of patient, obtaining history from patient or surrogate, ordering and performing treatments and interventions, ordering and review of laboratory studies, ordering and review of radiographic studies, pulse oximetry and re-evaluation of patient's condition.   Otilio Carpen Nikcole Eischeid, PA-C [facility] See Amion for pager If no response to pager , please call [phone] until 7pm After 7:00 pm call Elink  [phone]

## 2023-01-07 NOTE — Progress Notes (Signed)
Late Entry Note: Patient seen on 01/06/23  Subjective:    Patient underwent right THA by Dr. Alvan Dame on 12/21/22 due to femoral neck fracture  She was seen in our office last week and was doing very well at that point.  Her daughter contacted [place] to let us know of her readmission, as we were not aware. She is resting in bed on exam this morning. She has not been out of bed much.  Objective: Vital signs in last 24 hours: Temp:  [97.6 F (36.4 C)-99.1 F (37.3 C)] 99.1 F (37.3 C) (01/24 0733) Pulse Rate:  [38-102] 88 (01/24 0806) Resp:  [19-48] 34 (01/24 0806) BP: (97-197)/(49-107) 197/54 (01/24 0600) SpO2:  [87 %-100 %] 94 % (01/24 0806) FiO2 (%):  [40 %-50 %] 50 % (01/24 0806)  Intake/Output from previous day:  Intake/Output Summary (Last 24 hours) at 01/07/2023 0833 Last data filed at 01/07/2023 0700 Gross per 24 hour  Intake 1976.45 ml  Output 1925 ml  Net 51.45 ml     Intake/Output this shift: No intake/output data recorded.  Labs: Recent Labs    01/04/23 1002 01/05/23 0931 01/06/23 0721 01/07/23 0301  HGB 9.0* 9.3* 9.2* 8.5*   Recent Labs    01/06/23 0721 01/07/23 0301  WBC 20.9* 12.2*  RBC 2.88* 2.66*  HCT 28.3* 26.9*  PLT 642* 432*   Recent Labs    01/06/23 1058 01/07/23 0301  NA 139 142  K 3.5 4.4  CL 96* 98  CO2 32 32  BUN 24* 34*  CREATININE 0.59 0.69  GLUCOSE 203* 170*  CALCIUM 8.0* 8.0*   No results for input(s): "LABPT", "INR" in the last 72 hours.  Exam: General - Patient is Alert and Oriented Extremity - Neurologically intact Sensation intact distally Intact pulses distally Dorsiflexion/Plantar flexion intact Motor Function - intact, moving foot and toes well on exam.   Past Medical History:  Diagnosis Date   Anxiety    Aortic atherosclerosis (HCC)    Avascular necrosis of femur, right (HCC)    Balance problems    Chronic kidney disease, stage II (mild)    DDD (degenerative disc disease), lumbosacral    Depression    DJD  (degenerative joint disease), lumbar    Elevated cholesterol    GERD (gastroesophageal reflux disease)    HOH (hard of hearing)    Hypertension    Hypertensive renal disease, benign    Hypokalemia    Hyponatremia    Hypothyroidism    IBS (irritable bowel syndrome)    Major depression, recurrent (HCC)    Osteopenia    Osteoporosis    Post-menopausal    Reflux    Subclinical hypothyroidism    Varicose vein of leg    Vitamin D deficiency     Assessment/Plan:    Principal Problem:   Acute hypoxic respiratory failure (HCC) Active Problems:   Essential hypertension   Anxiety   Depression   Hypothyroidism   Multifocal pneumonia   Normocytic anemia   Hyperglycemia   Sepsis (HCC)  Estimated body mass index is 22.86 kg/m as calculated from the following:   Height as of this encounter: '5\' 2"'$  (1.575 m).   Weight as of this encounter: 56.7 kg. Advance diet Up with therapy  Patient had a recent right THA.  She may be WBAT, and has no hip precuations.  Dr. Alvan Dame and I came by today at her daughter's request.  PT for ambulation and ROM as able medically.  We are available  for assistance as needed.   Griffith Citron, PA-C Orthopedic Surgery [phone] 01/07/2023, 8:33 AM

## 2023-01-08 ENCOUNTER — Inpatient Hospital Stay (HOSPITAL_COMMUNITY): Payer: PPO

## 2023-01-08 DIAGNOSIS — G9341 Metabolic encephalopathy: Secondary | ICD-10-CM

## 2023-01-08 DIAGNOSIS — Z7189 Other specified counseling: Secondary | ICD-10-CM | POA: Diagnosis not present

## 2023-01-08 DIAGNOSIS — J9601 Acute respiratory failure with hypoxia: Secondary | ICD-10-CM | POA: Diagnosis not present

## 2023-01-08 DIAGNOSIS — Z515 Encounter for palliative care: Secondary | ICD-10-CM | POA: Diagnosis not present

## 2023-01-08 DIAGNOSIS — R918 Other nonspecific abnormal finding of lung field: Secondary | ICD-10-CM | POA: Diagnosis not present

## 2023-01-08 DIAGNOSIS — I255 Ischemic cardiomyopathy: Secondary | ICD-10-CM

## 2023-01-08 DIAGNOSIS — R9431 Abnormal electrocardiogram [ECG] [EKG]: Secondary | ICD-10-CM | POA: Diagnosis not present

## 2023-01-08 LAB — CBC
HCT: 27.1 % — ABNORMAL LOW (ref 36.0–46.0)
Hemoglobin: 8.3 g/dL — ABNORMAL LOW (ref 12.0–15.0)
MCH: 31.8 pg (ref 26.0–34.0)
MCHC: 30.6 g/dL (ref 30.0–36.0)
MCV: 103.8 fL — ABNORMAL HIGH (ref 80.0–100.0)
Platelets: 499 10*3/uL — ABNORMAL HIGH (ref 150–400)
RBC: 2.61 MIL/uL — ABNORMAL LOW (ref 3.87–5.11)
RDW: 14.4 % (ref 11.5–15.5)
WBC: 21.4 10*3/uL — ABNORMAL HIGH (ref 4.0–10.5)
nRBC: 0.1 % (ref 0.0–0.2)

## 2023-01-08 LAB — TROPONIN I (HIGH SENSITIVITY)
Troponin I (High Sensitivity): 306 ng/L (ref <18)
Troponin I (High Sensitivity): 673 ng/L (ref <18)

## 2023-01-08 LAB — CULTURE, BLOOD (ROUTINE X 2)
Culture: NO GROWTH
Culture: NO GROWTH

## 2023-01-08 LAB — BASIC METABOLIC PANEL
Anion gap: 17 — ABNORMAL HIGH (ref 5–15)
BUN: 58 mg/dL — ABNORMAL HIGH (ref 8–23)
CO2: 31 mmol/L (ref 22–32)
Calcium: 8 mg/dL — ABNORMAL LOW (ref 8.9–10.3)
Chloride: 100 mmol/L (ref 98–111)
Creatinine, Ser: 1.18 mg/dL — ABNORMAL HIGH (ref 0.44–1.00)
GFR, Estimated: 46 mL/min — ABNORMAL LOW (ref >60)
Glucose, Bld: 185 mg/dL — ABNORMAL HIGH (ref 70–99)
Potassium: 3.7 mmol/L (ref 3.5–5.1)
Sodium: 148 mmol/L — ABNORMAL HIGH (ref 135–145)

## 2023-01-08 LAB — ECHOCARDIOGRAM COMPLETE
Area-P 1/2: 4.65 cm2
Calc EF: 52 %
Height: 62 in
MV M vel: 6.19 m/s
MV Peak grad: 153.3 mmHg
Radius: 0.4 cm
S' Lateral: 2.7 cm
Single Plane A2C EF: 55.9 %
Single Plane A4C EF: 51.3 %
Weight: 2000.01 oz

## 2023-01-08 LAB — BLOOD GAS, ARTERIAL
Acid-Base Excess: 8.3 mmol/L — ABNORMAL HIGH (ref 0.0–2.0)
Bicarbonate: 36.4 mmol/L — ABNORMAL HIGH (ref 20.0–28.0)
Drawn by: 54496
Inspiratory PAP: 18 cmH2O
O2 Saturation: 97.7 %
Patient temperature: 37
pCO2 arterial: 66 mmHg (ref 32–48)
pH, Arterial: 7.35 (ref 7.35–7.45)
pO2, Arterial: 77 mmHg — ABNORMAL LOW (ref 83–108)

## 2023-01-08 LAB — APTT: aPTT: 30 seconds (ref 24–36)

## 2023-01-08 LAB — GLUCOSE, CAPILLARY: Glucose-Capillary: 172 mg/dL — ABNORMAL HIGH (ref 70–99)

## 2023-01-08 LAB — MAGNESIUM: Magnesium: 2.4 mg/dL (ref 1.7–2.4)

## 2023-01-08 LAB — SEDIMENTATION RATE: Sed Rate: 45 mm/hr — ABNORMAL HIGH (ref 0–22)

## 2023-01-08 LAB — PROTIME-INR
INR: 1.8 — ABNORMAL HIGH (ref 0.8–1.2)
Prothrombin Time: 20.8 seconds — ABNORMAL HIGH (ref 11.4–15.2)

## 2023-01-08 MED ORDER — HYDROMORPHONE HCL-NACL 50-0.9 MG/50ML-% IV SOLN
0.5000 mg/h | INTRAVENOUS | Status: DC
  Administered 2023-01-08: 0.5 mg/h via INTRAVENOUS
  Filled 2023-01-08: qty 50

## 2023-01-08 MED ORDER — LABETALOL HCL 5 MG/ML IV SOLN
10.0000 mg | INTRAVENOUS | Status: DC | PRN
  Administered 2023-01-08: 10 mg via INTRAVENOUS
  Filled 2023-01-08: qty 4

## 2023-01-08 MED ORDER — IOHEXOL 350 MG/ML SOLN
60.0000 mL | Freq: Once | INTRAVENOUS | Status: AC | PRN
  Administered 2023-01-08: 60 mL via INTRAVENOUS

## 2023-01-08 MED ORDER — METOPROLOL TARTRATE 25 MG PO TABS: 50.0000 mg | ORAL_TABLET | Freq: Two times a day (BID) | ORAL | Status: DC

## 2023-01-08 MED ORDER — SODIUM CHLORIDE 0.9 % IV SOLN: INTRAVENOUS | Status: DC

## 2023-01-08 MED ORDER — SODIUM CHLORIDE (PF) 0.9 % IJ SOLN
INTRAMUSCULAR | Status: AC
  Filled 2023-01-08: qty 50

## 2023-01-08 MED ORDER — POTASSIUM CHLORIDE 10 MEQ/100ML IV SOLN
10.0000 meq | INTRAVENOUS | Status: AC
  Administered 2023-01-08 (×4): 10 meq via INTRAVENOUS
  Filled 2023-01-08 (×4): qty 100

## 2023-01-08 MED ORDER — HYDROMORPHONE HCL 1 MG/ML IJ SOLN
1.0000 mg | INTRAMUSCULAR | Status: DC | PRN
  Administered 2023-01-08 (×3): 1 mg via INTRAVENOUS

## 2023-01-09 LAB — STREPTOCOCCUS PNEUMONIAE AG (CSF): Streptococcus Pneumoniae Ag: NEGATIVE

## 2023-01-15 NOTE — Progress Notes (Addendum)
eLink Physician-Brief Progress Note Patient Name: Brittany Douglas DOB: 03-04-1941 MRN: [id number]   Date of Service  2023/01/10  HPI/Events of Note  Patient with a change in mental status and focal neurological findings.  eICU Interventions  Code Stroke called. Metoprolol held.        Frederik Pear Jan 10, 2023, 12:43 AM

## 2023-01-15 NOTE — Consult Note (Signed)
Brief Cardiology Consultation   Patient ID: Brittany Douglas MRN: [id number]; DOB: 1941/08/10  Admit date: 01/02/2023 Date of Consult: Jan 21, 2023  PCP:  [name], [facility] Cardiologist:  Larae Grooms, MD        Patient Profile:   AYSIAH Douglas is a 82 y.o. female with a history of hypothyroidism, hypertension, anxiety/depression, hyperlipidemia, paroxysmal A-fib who presented with shortness of breath admitted for acute hypoxic respiratory failure who is provided consultation over by telephone for EKG changes.   History of Present Illness:   Ms. Brittany Douglas was admitted on 1/20 for acute hypoxic respiratory failure secondary to pneumonia complicated by sepsis requiring ICU level care for BiPAP.  CTA with a typical groundglass and organizing consolidations bilaterally, no PE.  Today code stroke initiated for focal neurodeficits and mental status changes in the setting of hypertensive emergency with systolic pressures in the 220s.  CT head without acute intracranial findings.  CTA head and neck negative for large vessel acute occlusion but indeterminate for multifocal CVA per discussion with neurology.  In the setting telemetry noted ST changes for which EKG was obtained and primary team concern for ST elevation in inferior leads.  Patient with altered mental status and not complaining of chest pain but is lethargic.  On review of EKG, diffuse ST changes noted throughout with concave ST elevations in the inferior leads not meeting criteria for STEMI given less than 1 mm and J-point elevation in III and aVF.  Some PR depression.  1 mm elevated II. Minimal concave ST elevation V5 V6 not meeting criteria.  On repeat EKG these changes are improved throughout inferior leads.  Also appears to be multifocal atrial tachycardia.  Past Medical History:  Diagnosis Date   Anxiety    Aortic atherosclerosis (HCC)    Avascular necrosis of femur, right  (HCC)    Balance problems    Chronic kidney disease, stage II (mild)    DDD (degenerative disc disease), lumbosacral    Depression    DJD (degenerative joint disease), lumbar    Elevated cholesterol    GERD (gastroesophageal reflux disease)    HOH (hard of hearing)    Hypertension    Hypertensive renal disease, benign    Hypokalemia    Hyponatremia    Hypothyroidism    IBS (irritable bowel syndrome)    Major depression, recurrent (HCC)    Osteopenia    Osteoporosis    Post-menopausal    Reflux    Subclinical hypothyroidism    Varicose vein of leg    Vitamin D deficiency     Past Surgical History:  Procedure Laterality Date   BREAST EXCISIONAL BIOPSY Bilateral    BREAST SURGERY     Benign breast lumps   CATARACT EXTRACTION     CHOLECYSTECTOMY     FOOT SURGERY     TOTAL HIP ARTHROPLASTY Right 12/21/2022   Procedure: TOTAL HIP ARTHROPLASTY ANTERIOR APPROACH;  Surgeon: Paralee Cancel, MD;  Location: [facility];  Service: Orthopedics;  Laterality: Right;   VAGINAL HYSTERECTOMY     A & P repair     Home Medications:  Prior to Admission medications   Medication Sig Start Date End Date Taking? Authorizing Provider  acetaminophen (TYLENOL) 325 MG tablet Take 1-2 tablets (325-650 mg total) by mouth every 6 (six) hours as needed for mild pain (pain score 1-3 or temp > 100.5). Patient taking differently: Take 650 mg by mouth 3 (three) times daily. 12/25/22  Yes  Shalhoub, Sherryll Burger, MD  ascorbic acid (VITAMIN C) 500 MG tablet Take 500 mg by mouth in the morning.   Yes [provider]  aspirin 81 MG tablet Take 1 tablet (81 mg total) by mouth at bedtime. Begin daily aspirin regimen after 28-day course of twice daily aspirin is complete. Patient taking differently: Take 81 mg by mouth See admin instructions. Starting on 01/01/2023, take 81 mg by mouth at 8 AM and 8 PM for 2 weeks 12/25/22  Yes Shalhoub, Sherryll Burger, MD  B Complex Vitamins (VITAMIN-B COMPLEX) TABS Take 1 tablet by mouth  daily with breakfast.   Yes [provider]  CALCIUM CITRATE+D3 PETITES 200-6.25 MG-MCG TABS Take 2 tablets by mouth daily.   Yes [provider]  Cholecalciferol (VITAMIN D3) 50 MCG (2000 UT) TABS Take 2,000 Units by mouth every Monday, Wednesday, and Friday.   Yes [provider]  Coenzyme Q10 (CO Q 10) 100 MG CAPS Take 200 mg by mouth daily.   Yes [provider]  conjugated estrogens (PREMARIN) vaginal cream INSERT 1/4 APPLICATOR VAGINALLY 2 TIMES A WEEK AS DIRECTED Patient taking differently: Place 8.25 Applicatorfuls vaginally See admin instructions. Place 0.53 applicator vaginally once a day only on Tuesdays and Fridays 11/25/22  Yes Wallace, Tiffany A, NP  diazepam (VALIUM) 2 MG tablet Take 1 mg by mouth every 6 (six) hours as needed for anxiety.   Yes [provider]  Flaxseed, Linseed, (FLAX SEEDS) POWD Take 15 g by mouth daily with breakfast.   Yes [provider]  levofloxacin (LEVAQUIN) 750 MG tablet Take 750 mg by mouth daily. 01/02/2023 01/10/23 Yes [provider]  levothyroxine (SYNTHROID) 25 MCG tablet Take 25-37.5 mcg by mouth See admin instructions. Take 37.5 mcg by mouth before breakfast on Sun/Sat and 25 mcg on Mon/Tues/Wed/Thurs/Fri 05/22/22  Yes [provider]  loratadine (CLARITIN) 10 MG tablet Take 10 mg by mouth at bedtime.   Yes [provider]  losartan (COZAAR) 100 MG tablet Take 100 mg by mouth See admin instructions. Take 100 mg by mouth in the morning and notify the MD if the Systolic number is <97 or >673 OR Diastolic number is <41 or >100   Yes [provider]  methocarbamol (ROBAXIN) 500 MG tablet Take 1 tablet (500 mg total) by mouth every 6 (six) hours as needed for muscle spasms. 12/25/22  Yes Shalhoub, Sherryll Burger, MD  Multiple Vitamin (MULTIVITAMIN) tablet Take 1 tablet by mouth daily with breakfast.   Yes [provider]  NON FORMULARY Take by mouth See admin instructions.  MedPass 2.0- As directed 2 times a day for nutritional supplement   Yes [provider]  Omega-3 Fatty Acids (FISH OIL) 1200 MG CAPS Take 1,200 mg by mouth daily.   Yes [provider]  ondansetron (ZOFRAN) 4 MG tablet Take 1 tablet (4 mg total) by mouth every 6 (six) hours as needed for nausea. 12/25/22  Yes Shalhoub, Sherryll Burger, MD  polyethylene glycol (MIRALAX / GLYCOLAX) 17 g packet Take 17 g by mouth daily as needed. Patient taking differently: Take 17 g by mouth daily as needed for mild constipation (to be mixed into 6-8 ounces of juice or water). 12/25/22  Yes Shalhoub, Sherryll Burger, MD  rosuvastatin (CRESTOR) 5 MG tablet Take 5 mg by mouth at bedtime.   Yes [provider]  sertraline (ZOLOFT) 100 MG tablet Take 200 mg by mouth in the morning.   Yes [provider]  SIMPLY SALINE  0.9 % AERS Place 1 spray into both nostrils 4 (four) times daily.   Yes [provider]  acetaminophen (TYLENOL) 325 MG tablet Take 2 tablets (650 mg total) by mouth 2 (two) times daily. Patient not taking: Reported on 01/06/2023 12/25/22   Vernelle Emerald, MD  aspirin 81 MG chewable tablet Chew 1 tablet (81 mg total) by mouth 2 (two) times daily for 28 days. Patient not taking: Reported on 01/04/2023 12/25/22 01/22/23  Vernelle Emerald, MD  feeding supplement (ENSURE ENLIVE / ENSURE PLUS) LIQD Take 237 mLs by mouth 2 (two) times daily between meals. Patient not taking: Reported on 01/05/2023 12/25/22   Vernelle Emerald, MD    Inpatient Medications: Scheduled Meds:  aspirin  81 mg Oral BID   Chlorhexidine Gluconate Cloth  6 each Topical Daily   diazepam  2.5 mg Intravenous TID   furosemide  40 mg Intravenous Q8H   ipratropium  0.5 mg Nebulization Q6H   levalbuterol  0.63 mg Nebulization Q6H   levothyroxine  25 mcg Oral Once per day on Mon Tue Wed Thu Fri   [START ON 01/10/2023] levothyroxine  37.5 mcg Oral Once per day on Sun Sat   methylPREDNISolone (SOLU-MEDROL)  injection  40 mg Intravenous Q12H   metoprolol tartrate  50 mg Oral BID   mouth rinse  15 mL Mouth Rinse 4 times per day   rosuvastatin  5 mg Oral QHS   sodium chloride (PF)       sodium chloride (PF)       Continuous Infusions:  sodium chloride 100 mL/hr at 2023/01/09 0418   albumin human 60 mL/hr at 2023/01/09 0418   azithromycin 250 mL/hr at 2023/01/09 0418   ceFEPime (MAXIPIME) IV Stopped (01/09/2023 0003)   dexmedetomidine (PRECEDEX) IV infusion 0.6 mcg/kg/hr (09-Jan-2023 0418)   PRN Meds: hydrALAZINE, labetalol, levalbuterol, ondansetron **OR** ondansetron (ZOFRAN) IV, mouth rinse, sodium chloride (PF), sodium chloride (PF)  Allergies:    Allergies  Allergen Reactions   Nitrofurantoin Nausea Only and Other (See Comments)    Upsets the stomach and "Allergic," per [facility]   Ace Inhibitors Other (See Comments)    "Allergic," per [facility]   Codeine Nausea And Vomiting and Other (See Comments)    "Allergic," per [facility]   Cymbalta [Duloxetine Hcl] Other (See Comments)    "Allergic," per MAR   Doxycycline Other (See Comments)    "Allergic," per [facility]   Gabapentin Nausea Only and Other (See Comments)    "Allergic," per [facility]   Hctz [Hydrochlorothiazide] Other (See Comments)    "Allergic," per MAR   Lisinopril Other (See Comments) and Cough    "Allergic," per MAR   Other Other (See Comments)    Unnamed B/P tablet- made the patient "pass out"   Penicillins Hives   Latex Rash   Sulfa Antibiotics Rash   Tape Rash and Other (See Comments)    Cloth tape preferred- NO latex tape!!    Social History:   Social History   Socioeconomic History   Marital status: Married    Spouse name: Not on file   Number of children: Not on file   Years of education: Not on file   Highest education level: Not on file  Occupational History   Not on file  Tobacco Use   Smoking status: Never   Smokeless tobacco: Never  Vaping Use   Vaping Use: Never used  Substance and Sexual Activity   Alcohol use: No     Alcohol/week: 0.0 standard  drinks of alcohol   Drug use: No   Sexual activity: Not Currently    Partners: Male    Birth control/protection: Post-menopausal, Surgical    Comment: 1st intercourse 53 yo-1 partner  Other Topics Concern   Not on file  Social History Narrative   Not on file   Social Determinants of Health   Financial Resource Strain: Not on file  Food Insecurity: No Food Insecurity (01/06/2023)   Hunger Vital Sign    Worried About Running Out of Food in the Last Year: Never true    Ran Out of Food in the Last Year: Never true  Transportation Needs: No Transportation Needs (01/06/2023)   PRAPARE - Hydrologist (Medical): No    Lack of Transportation (Non-Medical): No  Physical Activity: Not on file  Stress: Not on file  Social Connections: Not on file  Intimate Partner Violence: Not At Risk (01/06/2023)   Humiliation, Afraid, Rape, and Kick questionnaire    Fear of Current or Ex-Partner: No    Emotionally Abused: No    Physically Abused: No    Sexually Abused: No    Family History:    Family History  Problem Relation Age of Onset   Stroke Mother    Hypertension Mother    Uterine cancer Mother    Stroke Father    Heart attack Brother    Stroke Brother    Diabetes Maternal Grandmother    Heart attack Paternal Grandmother    Breast cancer Maternal Aunt    Hyperlipidemia Sister    Thyroid disease Sister    Colon polyps Daughter    Colon polyps Son    Other Grandson        NEOFIBROTOSIS     ROS:  Please see the history of present illness.   All other ROS reviewed and negative.     Physical Exam/Data:   Vitals:   01/29/2023 0024 01/29/2023 0100 Jan 29, 2023 0300 01/29/23 0400  BP:  (!) 184/74 (!) 202/112 (!) 172/98  Pulse: (!) 102 (!) 59 (!) 55 96  Resp: (!) 31 (!) 32 (!) 30 (!) 27  Temp:      TempSrc:      SpO2:  96% 96% 96%  Weight:      Height:        Intake/Output Summary (Last 24 hours) at Jan 29, 2023 0445 Last data  filed at 01-29-23 0418 Gross per 24 hour  Intake 2341.56 ml  Output 50 ml  Net 2291.56 ml      01/02/2023    7:53 AM 12/19/2022   11:21 PM 07/08/2022   11:41 AM  Last 3 Weights  Weight (lbs) 125 lb 125 lb 123 lb  Weight (kg) 56.7 kg 56.7 kg 55.792 kg     Body mass index is 22.86 kg/m.  No exam given consultation over the phone  EKG:  The EKG was personally reviewed and demonstrates: As above Telemetry:  Telemetry was personally reviewed and demonstrates: Unavailable  Relevant CV Studies:      ECHOCARDIOGRAM REPORT       Patient Name:   PIERA DOWNS Date of Exam: 12/25/2022   1. Left ventricular ejection fraction, by estimation, is 65%. The left ventricle has normal function. Left ventricular endocardial border not optimally defined to evaluate regional wall motion. There is mild left ventricular hypertrophy. Left ventricular diastolic parameters are consistent with Grade I diastolic dysfunction (impaired relaxation).  2. Right ventricular systolic function is normal. The right ventricular size  is normal. There is mildly elevated pulmonary artery systolic pressure.  3. The mitral valve is grossly normal. No evidence of mitral valve regurgitation. The mean mitral valve gradient is 2.0 mmHg with average heart rate of 86 bpm. Severe mitral annular calcification.  4. The aortic valve was not well visualized. There is mild calcification of the aortic valve. Aortic valve regurgitation is not visualized. Aortic valve sclerosis is present, with no evidence of aortic valve stenosis.  5. The inferior vena cava is normal in size with greater than 50% respiratory variability, suggesting right atrial pressure of 3 mmHg.   Laboratory Data:  High Sensitivity Troponin:  No results for input(s): "TROPONINIHS" in the last 720 hours.   Chemistry Recent Labs  Lab 01/06/23 1058 01/07/23 0301 01/29/2023 0258  NA 139 142 148*  K 3.5 4.4 3.7  CL 96* 98 100  CO2 32 32 31   GLUCOSE 203* 170* 185*  BUN 24* 34* 58*  CREATININE 0.59 0.69 1.18*  CALCIUM 8.0* 8.0* 8.0*  MG  --   --  2.4  GFRNONAA >60 >60 46*  ANIONGAP 11 12 17*    Recent Labs  Lab 12/21/2022 0825 01/04/23 1002  PROT 6.7 6.5  ALBUMIN 2.7* 2.6*  AST 29 40  ALT 24 33  ALKPHOS 90 130*  BILITOT 0.5 0.6   Lipids No results for input(s): "CHOL", "TRIG", "HDL", "LABVLDL", "LDLCALC", "CHOLHDL" in the last 168 hours.  Hematology Recent Labs  Lab 01/06/23 0721 01/07/23 0301 01/29/23 0258  WBC 20.9* 12.2* 21.4*  RBC 2.88* 2.66* 2.61*  HGB 9.2* 8.5* 8.3*  HCT 28.3* 26.9* 27.1*  MCV 98.3 101.1* 103.8*  MCH 31.9 32.0 31.8  MCHC 32.5 31.6 30.6  RDW 13.5 13.9 14.4  PLT 642* 432* 499*   Thyroid  Recent Labs  Lab 01/06/23 1134  TSH 2.872    BNP Recent Labs  Lab 12/21/2022 1154 01/06/23 1058  BNP 752.5* 768.5*    DDimer  Recent Labs  Lab 01/13/2023 1154  DDIMER 2.91*     Radiology/Studies:  CT ANGIO HEAD W OR WO CONTRAST  Result Date: 01/29/23 CLINICAL DATA:  Initial evaluation for neuro deficit, stroke suspected. EXAM: CT ANGIOGRAPHY HEAD AND NECK TECHNIQUE: Multidetector CT imaging of the head and neck was performed using the standard protocol during bolus administration of intravenous contrast. Multiplanar CT image reconstructions and MIPs were obtained to evaluate the vascular anatomy. Carotid stenosis measurements (when applicable) are obtained utilizing NASCET criteria, using the distal internal carotid diameter as the denominator. RADIATION DOSE REDUCTION: This exam was performed according to the departmental dose-optimization program which includes automated exposure control, adjustment of the mA and/or kV according to patient size and/or use of iterative reconstruction technique. CONTRAST:  74m OMNIPAQUE IOHEXOL 350 MG/ML SOLN COMPARISON:  Prior CT from earlier the same day. FINDINGS: CTA NECK FINDINGS Aortic arch: Partially visualized aortic arch within normal limits for  caliber. Moderate atheromatous change about the arch itself. No visible significant stenosis about the origin the great vessels. Right carotid system: Right common and internal carotid arteries are patent without dissection. Bulky calcified plaque about the right carotid bulb without hemodynamically significant greater than 50% stenosis. Left carotid system: Left common and internal carotid arteries are patent without dissection. Bulky calcified plaque about the left carotid bulb with associated stenosis of up to 60% by NASCET criteria (series 8, image 112). Vertebral arteries: Both vertebral arteries arise from the subclavian arteries. No proximal subclavian artery stenosis. Vertebral arteries patent without dissection or  significant stenosis. Skeleton: No discrete or worrisome osseous lesions. Moderately advanced spondylosis present at C3-4 through C6-7. Other neck: No other acute soft tissue abnormality within the neck. Probable trace secretions noted within the subglottic airway. Upper chest: Layering bilateral pleural effusions, partially visualized. Extensive multifocal parenchymal opacities within the visualized lungs, suspicious for infection/pneumonia. Review of the MIP images confirms the above findings CTA HEAD FINDINGS Anterior circulation: Examination degraded by motion and positioning. Moderate atheromatous change within the carotid siphons without high-grade stenosis. 2 mm outpouching extending laterally from the cavernous right ICA, consistent with a small aneurysm (series 8, image 220). Additional tiny 2-3 mm outpouching arising from the contralateral cavernous left ICA could also reflect a small aneurysm or possibly vascular infundibulum (series 8, image 217). A1 segments patent bilaterally. Normal anterior communicating complex. Anterior cerebral arteries patent without stenosis. No M1 stenosis or occlusion. No proximal MCA branch occlusion or high-grade stenosis. Distal MCA branches perfused and  symmetric. Posterior circulation: Atheromatous change within the V4 segments without high-grade stenosis. Left vertebral artery slightly dominant. Both PICA patent. Basilar patent without stenosis superior cerebellar and posterior cerebral arteries patent bilaterally. Venous sinuses: Grossly patent allowing for timing the contrast bolus. Anatomic variants: As above. Review of the MIP images confirms the above findings IMPRESSION: 1. Negative CTA for large vessel occlusion or other emergent finding. 2. Bulky calcified plaque about the left carotid bulb with associated stenosis of up to 60% by NASCET criteria. 3. Bulky calcified plaque about the right carotid bulb without hemodynamically significant greater than 50% stenosis. 4. 2 mm cavernous right ICA aneurysm. 2-3 mm outpouching at the contralateral cavernous left ICA could reflect an additional small aneurysm or vascular infundibulum. Attention at follow-up recommended. 5. Extensive multifocal parenchymal opacities within the visualized lungs, suspicious for infection/pneumonia. 6. Layering bilateral pleural effusions, partially visualized. Electronically Signed   By: Jeannine Boga M.D.   On: 02-03-2023 02:39   CT ANGIO NECK W OR WO CONTRAST  Result Date: 02-03-23 CLINICAL DATA:  Initial evaluation for neuro deficit, stroke suspected. EXAM: CT ANGIOGRAPHY HEAD AND NECK TECHNIQUE: Multidetector CT imaging of the head and neck was performed using the standard protocol during bolus administration of intravenous contrast. Multiplanar CT image reconstructions and MIPs were obtained to evaluate the vascular anatomy. Carotid stenosis measurements (when applicable) are obtained utilizing NASCET criteria, using the distal internal carotid diameter as the denominator. RADIATION DOSE REDUCTION: This exam was performed according to the departmental dose-optimization program which includes automated exposure control, adjustment of the mA and/or kV according to  patient size and/or use of iterative reconstruction technique. CONTRAST:  80m OMNIPAQUE IOHEXOL 350 MG/ML SOLN COMPARISON:  Prior CT from earlier the same day. FINDINGS: CTA NECK FINDINGS Aortic arch: Partially visualized aortic arch within normal limits for caliber. Moderate atheromatous change about the arch itself. No visible significant stenosis about the origin the great vessels. Right carotid system: Right common and internal carotid arteries are patent without dissection. Bulky calcified plaque about the right carotid bulb without hemodynamically significant greater than 50% stenosis. Left carotid system: Left common and internal carotid arteries are patent without dissection. Bulky calcified plaque about the left carotid bulb with associated stenosis of up to 60% by NASCET criteria (series 8, image 112). Vertebral arteries: Both vertebral arteries arise from the subclavian arteries. No proximal subclavian artery stenosis. Vertebral arteries patent without dissection or significant stenosis. Skeleton: No discrete or worrisome osseous lesions. Moderately advanced spondylosis present at C3-4 through C6-7. Other neck: No other acute soft  tissue abnormality within the neck. Probable trace secretions noted within the subglottic airway. Upper chest: Layering bilateral pleural effusions, partially visualized. Extensive multifocal parenchymal opacities within the visualized lungs, suspicious for infection/pneumonia. Review of the MIP images confirms the above findings CTA HEAD FINDINGS Anterior circulation: Examination degraded by motion and positioning. Moderate atheromatous change within the carotid siphons without high-grade stenosis. 2 mm outpouching extending laterally from the cavernous right ICA, consistent with a small aneurysm (series 8, image 220). Additional tiny 2-3 mm outpouching arising from the contralateral cavernous left ICA could also reflect a small aneurysm or possibly vascular infundibulum  (series 8, image 217). A1 segments patent bilaterally. Normal anterior communicating complex. Anterior cerebral arteries patent without stenosis. No M1 stenosis or occlusion. No proximal MCA branch occlusion or high-grade stenosis. Distal MCA branches perfused and symmetric. Posterior circulation: Atheromatous change within the V4 segments without high-grade stenosis. Left vertebral artery slightly dominant. Both PICA patent. Basilar patent without stenosis superior cerebellar and posterior cerebral arteries patent bilaterally. Venous sinuses: Grossly patent allowing for timing the contrast bolus. Anatomic variants: As above. Review of the MIP images confirms the above findings IMPRESSION: 1. Negative CTA for large vessel occlusion or other emergent finding. 2. Bulky calcified plaque about the left carotid bulb with associated stenosis of up to 60% by NASCET criteria. 3. Bulky calcified plaque about the right carotid bulb without hemodynamically significant greater than 50% stenosis. 4. 2 mm cavernous right ICA aneurysm. 2-3 mm outpouching at the contralateral cavernous left ICA could reflect an additional small aneurysm or vascular infundibulum. Attention at follow-up recommended. 5. Extensive multifocal parenchymal opacities within the visualized lungs, suspicious for infection/pneumonia. 6. Layering bilateral pleural effusions, partially visualized. Electronically Signed   By: Jeannine Boga M.D.   On: 01/13/2023 02:39   CT HEAD CODE STROKE WO CONTRAST  Result Date: Jan 13, 2023 CLINICAL DATA:  Code stroke. Initial evaluation for neuro deficit, stroke suspected. EXAM: CT HEAD WITHOUT CONTRAST TECHNIQUE: Contiguous axial images were obtained from the base of the skull through the vertex without intravenous contrast. RADIATION DOSE REDUCTION: This exam was performed according to the departmental dose-optimization program which includes automated exposure control, adjustment of the mA and/or kV according to  patient size and/or use of iterative reconstruction technique. COMPARISON:  Prior study from 06/21/2022. FINDINGS: Brain: Examination degraded by motion artifact. Age-related cerebral atrophy with chronic small vessel ischemic disease. No acute intracranial hemorrhage. No acute large vessel territory infarct. No mass lesion, midline shift or mass effect. No hydrocephalus or extra-axial fluid collection. Vascular: No abnormal hyperdense vessel. Calcified atherosclerosis present at the skull base. Skull: Scalp soft tissues and calvarium within normal limits. Sinuses/Orbits: Globes orbital soft tissues demonstrate no acute finding. Paranasal sinuses are clear. No mastoid effusion. Other: None. ASPECTS [facility] Stroke Program Early CT Score) - Ganglionic level infarction (caudate, lentiform nuclei, internal capsule, insula, M1-M3 cortex): 7 - Supraganglionic infarction (M4-M6 cortex): 3 Total score (0-10 with 10 being normal): 10 IMPRESSION: 1. No acute intracranial abnormality. 2. ASPECTS is 10. 3. Age-related cerebral atrophy with chronic small vessel ischemic disease. Results were called by telephone at the time of interpretation on 01-13-2023 at 1:15 am to provider Trevor Mace , who verbally acknowledged these results. Electronically Signed   By: Jeannine Boga M.D.   On: 01-13-23 01:18   DG Chest Port 1V same Day  Result Date: 01/06/2023 CLINICAL DATA:  Pneumonia EXAM: PORTABLE CHEST 1 VIEW COMPARISON:  12/29/2022 FINDINGS: The heart size and mediastinal diffuse interstitial and alveolar opacity consistent  with worsening pulmonary edema and/or diffuse bilateral pneumonia. There is suggestion of small pleural effusions. No pneumothorax identified. Calcified aorta. IMPRESSION: Diffuse interstitial and alveolar opacities with interval worsening. Electronically Signed   By: Sammie Bench M.D.   On: 01/06/2023 08:36     Assessment and Plan:   ST-segment changes on EKG Does not meet criteria for  STEMI at this time.  Patient is critically ill 82 year old female with acute hypoxic respiratory failure and recent focal neurologic deficits undergoing stroke code with possible intracranial events in the setting of hypertensive emergency now with EKG changes and new underlying rhythm multifocal atrial tachycardia.  While she does have ST-T segment changes, these are more likely in the setting of her acute illness and hypertensive urgency/intracranial events rather than true type I ACS.  They are borderline and less than 1 mm, concave in nature which tends to suggest repolarization rather than ischemia.  Difficult subjective assessment chest pain given her mental status.  In the setting of her critical illness and possible intracranial event and absence of chest pain, recommendations to start with biomarker evaluation with troponins and close monitoring. -Ordered troponins and trend -Consider repeat echo to assess for any wall motion abnormalities -If clear ST elevation changes meeting criteria or biomarker data suggesting significant myocardial damage, risk-benefit discussion would have to be had with patient family given her critical illness as well as possible CVA  Risk Assessment/Risk Scores:          CHA2DS2-VASc Score =     This indicates a  % annual risk of stroke. The patient's score is based upon:           For questions or updates, please contact [place] Please consult [email or web address] for contact info under    Signed, Silas Flood, MD  02/01/23 4:45 AM

## 2023-01-15 NOTE — Progress Notes (Addendum)
Spoke with RN about PT condition while on BiPAP. RN spoke with MD and he is aware of PT condition and Code status- approved BiPAP usage.

## 2023-01-15 NOTE — Progress Notes (Signed)
Transported (on BiPAP) to CT and back to ICU without incident.

## 2023-01-15 NOTE — Progress Notes (Signed)
74: Telestroke cart activated at this time. LKW 0000. ROS 0020. Daughter at bedside, states pt with AMS, and right sided weakness with facial droop. MRS 3.  0054: Patient to CT 0103:TSP paged at this time.  0108: TSP on camera at this time assessing patient.  0122: Results of NCCT given to TSP at this time  0150: Bedside team attempted IV access *2 for advanced imaging. TSP requesting just CTA be completed at this time.  0200: TSP and TSRN off camera at this time. TSP to follow up with EDP for plan of care. TSP also states he will call daughter and provide update.

## 2023-01-15 NOTE — Progress Notes (Signed)
Alerted by NT and RT at approximately 0040 that patient has experienced a change in condition. Patient last known well around 0000 when she wrote "breathing" on her white board with her right hand while on BIPAP. RT reported that patient suddenly was tachycardic and dyspneic with unresponsiveness for less than 5 minutes. When she started responding, she was unable to move her right side and had right facial droop. CODE STROKE initiated in coordination with Dr. Lucile Shutters from [place] at approximately [place]. Patient taken to CT and tele Neuro went with her. CT and CTA done. Ongoing follow up and monitoring in ICU.

## 2023-01-15 NOTE — Progress Notes (Signed)
[facility] ADULT ICU REPLACEMENT PROTOCOL   The patient does apply for the [facility] Adult ICU Electrolyte Replacment Protocol based on the criteria listed below:   1.Exclusion criteria: TCTS, ECMO, Dialysis, and Myasthenia Gravis patients 2. Is GFR >/= 30 ml/min? Yes.    Patient's GFR today is 46 3. Is SCr </= 2? Yes.   Patient's SCr is 1.18 mg/dL 4. Did SCr increase >/= 0.5 in 24 hours? No. 5.Pt's weight >40kg  Yes.   6. Abnormal electrolyte(s):  K 3.7  7. Electrolytes replaced per protocol 8.  Call MD STAT for K+ </= 2.5, Phos </= 1, or Mag </= 1 Physician:  Lowella Bandy R Shalin Linders 02/06/23 5:09 AM

## 2023-01-15 NOTE — Consult Note (Signed)
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  Our recommendations are outlined below.  Recommendations:        Neuro Checks       Bedside Swallow Eval       DVT Prophylaxis       IV Fluids, Normal Saline       Head of Bed 30 Degrees       Euglycemia and Avoid Hyperthermia (PRN Acetaminophen)       Initiate or continue Aspirin 81 MG daily       Cursory medical workup/management for underlying systemic causes of patient's encephalopathy per primary team; if workup is unrevealing or patient fails to improve as expected, may consult neurology and/or obtain further neurologic workup (MRI) as indicated.  Sign Out:       Discussed with Primary Attending       Discussed with Rapid Response Team    ------------------------------------------------------------------------------  Advanced Imaging: CTA Head and Neck Completed.  LVO:No  Patient in not a candidate for NIR   Metrics: Last Known Well: 01-18-23 00:00:00 [facility] Notification Time: 18-Jan-2023 01:03:18 Stamp Time: January 18, 2023 01:03:18 Initial Response Time: January 18, 2023 01:08:08 Symptoms: right sided weakness and aphasia. Initial patient interaction: 01/18/2023 01:13:00 NIHSS Assessment Completed: Jan 18, 2023 01:17:41 Patient is not a candidate for Thrombolytic. Thrombolytic Medical Decision: 2023/01/18 01:24:24 Patient was not deemed candidate for Thrombolytic because of following reasons: major  surgery within last 3 weeks (total hip replacement) hypercapnic respiratory failure.  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  Primary Provider Notified of Diagnostic Impression and Management Plan on: 18-Jan-2023 02:10:00 Spoke With: Dr. Lucile Shutters Able to Reach 01/18/23 02:10:00    ------------------------------------------------------------------------------  History of Present Illness: Patient is a 82 year old Female.  Inpatient stroke alert was called for symptoms of right sided weakness and aphasia. Patient is an 82 year old female with admitted to rehab s/p right hip fx, POD #18 s/p right hip arthroplasty presenting with acute onset right sided weakness and aphasia. Patient had been on heparin IV infusion for suspected afib until 12 hours ago, as afib was ruled out by cardiology. Patient has had severe respiratory problems and pneumonia since admission; she has been on BiPAP and required increasing oxygen levels. Patient is unable to provide history due to difficulty speaking, largely due to respiratory failure.    Past Medical History:      Hypertension [place] PMH:  respiratory failure on BiPAP; right hip fx, pneumonia unable to obtain due to:   Patient Cannot Speak  Medications:  No Anticoagulant use  Antiplatelet use: Yes aspirin Reviewed EMR for current medications Other Medications Pertinent To Assessment Include: stopped heparin IV infusion 12 hours ago  Allergies:  Reviewed Allergies Unable To Obtain Due To: Patient Cannot Speak  Social History: Unable To Obtain Due To Patient Status : Patient Cannot Speak  Family History:  Family History Cannot Be Obtained Because:Patient Cannot Speak  ROS : ROS Cannot Be Obtained Because:  Patient Cannot Speak  Past Surgical History: Past Surgical History Cannot Be Obtained Because: Patient  Cannot Speak There Is No Surgical History Contributory To Today's Visit There Is Surgical History of:  Total hip replacement 12/21/2022     Examination: BP(175/72), Pulse(102), Blood Glucose(172) 1A: Level of Consciousness - Alert; keenly responsive + 0 1B: Ask Month and Age - Could Not Answer Either Question Correctly + 2 1C: Blink Eyes & Squeeze Hands - Performs Both Tasks + 0 2: Test Horizontal Extraocular Movements - Normal + 0 3: Test Visual Fields - No Visual Loss + 0 4: Test Facial Palsy (Use Grimace if Obtunded) - Normal symmetry + 0 5A: Test Left Arm Motor Drift - No Drift for 10 Seconds + 0 5B: Test Right Arm Motor Drift - Drift, hits bed + 2 6A: Test Left Leg Motor Drift - Some Effort Against Gravity + 2 6B: Test Right Leg Motor Drift - No Effort Against Gravity + 3 7: Test Limb Ataxia (FNF/Heel-Shin) - No Ataxia + 0 8: Test Sensation - Normal; No sensory loss + 0 9: Test Language/Aphasia - Severe Aphasia: Fragmentary Expression, Inference Needed, Cannot Identify Materials + 2 10: Test Dysarthria - Mild-Moderate Dysarthria: Slurring but can be understood + 1 11: Test Extinction/Inattention - No abnormality + 0  NIHSS Score: 12   Pre-Morbid Modified Rankin Scale: 3 Points = Moderate disability; requiring some help, but able to walk without assistance  Spoke with : Dr. Lucile Shutters  Patient/Family was informed the Neurology Consult would occur via TeleHealth consult by way of interactive audio and video telecommunications and consented to receiving care in this manner.   Patient is being evaluated for possible acute neurologic impairment and high probability of imminent or life-threatening deterioration. I spent total of 60 minutes providing care to this patient, including time for face to face visit via telemedicine, review of medical records, imaging studies and discussion of findings with providers, the patient and/or family.   Dr Heron Sabins   [facility] For Inpatient  follow-up with [facility] physician please call [facility] [phone]. This is not an outpatient service. Post hospital discharge, please contact hospital directly.  Please do not communicate with [facility] physicians via secure chat. If you have any questions, Please contact [facility]. Please call or reconsult our service if there are any clinical or diagnostic changes.

## 2023-01-15 NOTE — Progress Notes (Signed)
Chaplain met with Brittany Douglas's children after learning that they had made the decision to proceed with comfort measures only.  They were coping as well as can be expected and made decisions out of love that they felt at peace with.  They did not have any needs at this time.  Chaplain Janne Napoleon, [place] PAger, [phone]

## 2023-01-15 NOTE — Progress Notes (Signed)
eLink Physician-Brief Progress Note Patient Name: Brittany Douglas DOB: [birth year] MRN: [id number]   Date of Service  Feb 02, 2023  HPI/Events of Note  Patient with inferior subtle ST changes, patient not complaining of chest pain, recent code stroke, already on Aspirin.  eICU Interventions  Troponin cycled, Cardiology consulted (see note), plan is to defer aggressive Rx for now given complicated co-morbidities, will obtain echo in AM for RWMA, per cardiology depending on Troponin and echo findings treatment  may be reconsidered.        Kerry Kass Elli Groesbeck Feb 02, 2023, 4:44 AM

## 2023-01-15 NOTE — Consult Note (Signed)
Consultation Note Date: 17-Jan-2023   Patient Name: Brittany Douglas  DOB: [birth year]  MRN: [id number]  Age / Sex: 82 y.o., female  PCP: [name], MD Referring Physician: Candee Furbish, MD  Reason for Consultation: Establishing goals of care  HPI/Patient Profile: 82 y.o. 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  Patient had a fall earlier this month in January 2024 and was admitted from 1-6 through 1-11 with a fall and subsequent right hip fracture for which she underwent arthroplasty 12-21-2022.  She was diagnosed with pneumonia during that admission and was discharged on antibiotics to skilled nursing facility for rehab.  In the rehab as she was gaining ground and able to participate in physical therapy however she had worsening cough shortness of breath and was brought back to the emergency department on 01/13/2023.  CT scan of the chest showed progressive groundglass opacities and consolidation, she was admitted to hospital medicine service, later on she came under the care of pulmonary critical care medicine, she was given broad-spectrum antibiotics, she was found to have a worsening chest x-ray appearance, she was found to have been requiring BiPAP very consistently, hospital course also complicated by atrial fibrillation with rapid ventricular response. Goals of  care discussions were initiated by pulmonary critical care medicine and palliative medicine team consulted for ongoing support with goals of care discussions as well as for a possible transition to comfort focused care. Chart reviewed, palliative consult  requested received Patient seen and examined Call placed and introduced myself to daughter: Palliative medicine is specialized medical care for people living with serious illness. It focuses on providing relief from the symptoms and stress of a serious illness. The goal is to improve quality of life for both the patient and the family. Goals of care: Broad aims of medical therapy in relation to the patient's values and preferences. Our aim is to provide medical care aimed at enabling patients to achieve the goals that matter most to them, given the circumstances of their particular medical situation and their constraints.   Daughter stated that the patient's son is coming in from [place].  A family meeting was planned.  HCPOA Daughter and son.  SUMMARY OF RECOMMENDATIONS   Family meeting: Patient's son and daughter arrived.  We discussed about patient's current condition, we reviewed about how this hospitalization has been going.  Brief life review performed.  Goals wishes and values attempted to be explored.  Offered opportunity for space and reflection.  Overall, patient's son and daughter would like to proceed with comfort measures, discontinuation of BiPAP as the patient was not even on supplemental oxygen via nasal cannula prior to this hospitalization, they are in favor of use of medications to ease the work of breathing and to avoid pain  and suffering.  They are aware of the serious incurable nature of the patient's illness however due to commented that the patient has had an acute precipitous  decline.  Offered active listening supportive empathic presence and other compassionate therapeutic techniques were employed at the time of this initial encounter.  Anticipate hospital death.  Full scope of comfort measures.  Recommend initiation of low-dose continuous opioids. Thank you for the consult.  Code Status/Advance Care Planning: DNR   Symptom Management:     Palliative Prophylaxis:   Delirium Protocol  Additional Recommendations (Limitations, Scope, Preferences): Full Comfort Care  Psycho-social/Spiritual:  Desire for further Chaplaincy support:yes Additional Recommendations: Education on Hospice  Prognosis:  Hours - Days  Discharge Planning: Anticipated Hospital Death      Primary Diagnoses: Present on Admission:  Acute hypoxic respiratory failure ([place])  Multifocal pneumonia  Anxiety  Depression  Essential hypertension  Hypothyroidism   I have reviewed the medical record, interviewed the patient and family, and examined the patient. The following aspects are pertinent.  Past Medical History:  Diagnosis Date   Anxiety    Aortic atherosclerosis (HCC)    Avascular necrosis of femur, right (HCC)    Balance problems    Chronic kidney disease, stage II (mild)    DDD (degenerative disc disease), lumbosacral    Depression    DJD (degenerative joint disease), lumbar    Elevated cholesterol    GERD (gastroesophageal reflux disease)    HOH (hard of hearing)    Hypertension    Hypertensive renal disease, benign    Hypokalemia    Hyponatremia    Hypothyroidism    IBS (irritable bowel syndrome)    Major depression, recurrent (HCC)    Osteopenia    Osteoporosis    Post-menopausal    Reflux    Subclinical hypothyroidism    Varicose vein of leg    Vitamin D deficiency    Social History   Socioeconomic History   Marital status: Married    Spouse name: Not on file   Number of children: Not on file   Years of education: Not on file   Highest education level: Not on file  Occupational History   Not on file  Tobacco Use   Smoking status: Never   Smokeless tobacco: Never  Vaping Use   Vaping Use: Never used  Substance and Sexual Activity   Alcohol use: No    Alcohol/week: 0.0 standard drinks of alcohol   Drug use: No   Sexual activity: Not Currently    Partners: Male    Birth control/protection: Post-menopausal, Surgical    Comment: 1st  intercourse 22 yo-1 partner  Other Topics Concern   Not on file  Social History Narrative   Not on file   Social Determinants of Health   Financial Resource Strain: Not on file  Food Insecurity: No Food Insecurity (01/06/2023)   Hunger Vital Sign    Worried About Running Out of Food in the Last Year: Never true    Ran Out of Food in the Last Year: Never true  Transportation Needs: No Transportation Needs (01/06/2023)   PRAPARE - Hydrologist (Medical): No    Lack of Transportation (Non-Medical): No  Physical Activity: Not on file  Stress: Not on file  Social Connections: Not on file   Family History  Problem Relation Age of Onset   Stroke Mother    Hypertension Mother    Uterine cancer Mother    Stroke Father  Heart attack Brother    Stroke Brother    Diabetes Maternal Grandmother    Heart attack Paternal Grandmother    Breast cancer Maternal Aunt    Hyperlipidemia Sister    Thyroid disease Sister    Colon polyps Daughter    Colon polyps Son    Other Grandson        NEOFIBROTOSIS   Scheduled Meds:  aspirin  81 mg Oral BID   Chlorhexidine Gluconate Cloth  6 each Topical Daily   diazepam  2.5 mg Intravenous TID   ipratropium  0.5 mg Nebulization Q6H   levalbuterol  0.63 mg Nebulization Q6H   levothyroxine  25 mcg Oral Once per day on Mon Tue Wed Thu Fri   [START ON 01/10/2023] levothyroxine  37.5 mcg Oral Once per day on Sun Sat   methylPREDNISolone (SOLU-MEDROL) injection  40 mg Intravenous Q12H   metoprolol tartrate  50 mg Oral BID   mouth rinse  15 mL Mouth Rinse 4 times per day   rosuvastatin  5 mg Oral QHS   sodium chloride (PF)       sodium chloride (PF)       Continuous Infusions:  sodium chloride 100 mL/hr at 24-Jan-2023 0800   azithromycin 250 mL/hr at 01/24/2023 0800   ceFEPime (MAXIPIME) IV Stopped (01-24-23 0003)   dexmedetomidine (PRECEDEX) IV infusion 0.3 mcg/kg/hr (01-24-2023 0944)   PRN Meds:.hydrALAZINE, labetalol,  levalbuterol, ondansetron **OR** ondansetron (ZOFRAN) IV, mouth rinse, sodium chloride (PF), sodium chloride (PF) Medications Prior to Admission:  Prior to Admission medications   Medication Sig Start Date End Date Taking? Authorizing Provider  acetaminophen (TYLENOL) 325 MG tablet Take 1-2 tablets (325-650 mg total) by mouth every 6 (six) hours as needed for mild pain (pain score 1-3 or temp > 100.5). Patient taking differently: Take 650 mg by mouth 3 (three) times daily. 12/25/22  Yes Shalhoub, Sherryll Burger, MD  ascorbic acid (VITAMIN C) 500 MG tablet Take 500 mg by mouth in the morning.   Yes [provider]  aspirin 81 MG tablet Take 1 tablet (81 mg total) by mouth at bedtime. Begin daily aspirin regimen after 28-day course of twice daily aspirin is complete. Patient taking differently: Take 81 mg by mouth See admin instructions. Starting on 01/01/2023, take 81 mg by mouth at 8 AM and 8 PM for 2 weeks 12/25/22  Yes Shalhoub, Sherryll Burger, MD  B Complex Vitamins (VITAMIN-B COMPLEX) TABS Take 1 tablet by mouth daily with breakfast.   Yes [provider]  CALCIUM CITRATE+D3 PETITES 200-6.25 MG-MCG TABS Take 2 tablets by mouth daily.   Yes [provider]  Cholecalciferol (VITAMIN D3) 50 MCG (2000 UT) TABS Take 2,000 Units by mouth every Monday, Wednesday, and Friday.   Yes [provider]  Coenzyme Q10 (CO Q 10) 100 MG CAPS Take 200 mg by mouth daily.   Yes [provider]  conjugated estrogens (PREMARIN) vaginal cream INSERT 1/4 APPLICATOR VAGINALLY 2 TIMES A WEEK AS DIRECTED Patient taking differently: Place 4.65 Applicatorfuls vaginally See admin instructions. Place 0.35 applicator vaginally once a day only on Tuesdays and Fridays 11/25/22  Yes Wallace, Tiffany A, NP  diazepam (VALIUM) 2 MG tablet Take 1 mg by mouth every 6 (six) hours as needed for anxiety.   Yes [provider]  Flaxseed, Linseed, (FLAX SEEDS) POWD Take 15 g by mouth daily with  breakfast.   Yes [provider]  levofloxacin (LEVAQUIN) 750 MG tablet Take 750 mg by mouth daily.  12/15/2022 01/10/23 Yes [provider]  levothyroxine (SYNTHROID) 25 MCG tablet Take 25-37.5 mcg by mouth See admin instructions. Take 37.5 mcg by mouth before breakfast on Sun/Sat and 25 mcg on Mon/Tues/Wed/Thurs/Fri 05/22/22  Yes [provider]  loratadine (CLARITIN) 10 MG tablet Take 10 mg by mouth at bedtime.   Yes [provider]  losartan (COZAAR) 100 MG tablet Take 100 mg by mouth See admin instructions. Take 100 mg by mouth in the morning and notify the MD if the Systolic number is <32 or >671 OR Diastolic number is <24 or >100   Yes [provider]  methocarbamol (ROBAXIN) 500 MG tablet Take 1 tablet (500 mg total) by mouth every 6 (six) hours as needed for muscle spasms. 12/25/22  Yes Shalhoub, Sherryll Burger, MD  Multiple Vitamin (MULTIVITAMIN) tablet Take 1 tablet by mouth daily with breakfast.   Yes [provider]  NON FORMULARY Take by mouth See admin instructions. MedPass 2.0- As directed 2 times a day for nutritional supplement   Yes [provider]  Omega-3 Fatty Acids (FISH OIL) 1200 MG CAPS Take 1,200 mg by mouth daily.   Yes [provider]  ondansetron (ZOFRAN) 4 MG tablet Take 1 tablet (4 mg total) by mouth every 6 (six) hours as needed for nausea. 12/25/22  Yes Shalhoub, Sherryll Burger, MD  polyethylene glycol (MIRALAX / GLYCOLAX) 17 g packet Take 17 g by mouth daily as needed. Patient taking differently: Take 17 g by mouth daily as needed for mild constipation (to be mixed into 6-8 ounces of juice or water). 12/25/22  Yes Shalhoub, Sherryll Burger, MD  rosuvastatin (CRESTOR) 5 MG tablet Take 5 mg by mouth at bedtime.   Yes [provider]  sertraline (ZOLOFT) 100 MG tablet Take 200 mg by mouth in the morning.   Yes [provider]  SIMPLY SALINE 0.9 % AERS Place 1 spray into both nostrils 4 (four) times daily.   Yes  [provider]  acetaminophen (TYLENOL) 325 MG tablet Take 2 tablets (650 mg total) by mouth 2 (two) times daily. Patient not taking: Reported on 01/13/2023 12/25/22   Vernelle Emerald, MD  aspirin 81 MG chewable tablet Chew 1 tablet (81 mg total) by mouth 2 (two) times daily for 28 days. Patient not taking: Reported on 01/04/2023 12/25/22 01/22/23  Vernelle Emerald, MD  feeding supplement (ENSURE ENLIVE / ENSURE PLUS) LIQD Take 237 mLs by mouth 2 (two) times daily between meals. Patient not taking: Reported on 01/10/2023 12/25/22   Vernelle Emerald, MD   Allergies  Allergen Reactions   Nitrofurantoin Nausea Only and Other (See Comments)    Upsets the stomach and "Allergic," per [facility]   Ace Inhibitors Other (See Comments)    "Allergic," per [facility]   Codeine Nausea And Vomiting and Other (See Comments)    "Allergic," per [facility]   Cymbalta [Duloxetine Hcl] Other (See Comments)    "Allergic," per [facility]   Doxycycline Other (See Comments)    "Allergic," per [facility]   Gabapentin Nausea Only and Other (See Comments)    "Allergic," per [facility]   Hctz [Hydrochlorothiazide] Other (See Comments)    "Allergic," per [facility]   Lisinopril Other (See Comments) and Cough    "Allergic," per [facility]   Other Other (See Comments)    Unnamed B/P tablet- made the patient "pass out"   Penicillins Hives   Latex Rash   Sulfa Antibiotics Rash   Tape Rash and Other (See Comments)  Cloth tape preferred- NO latex tape!!   Review of Systems Unable to be completed because patient does not verbalize Physical Exam Frail elderly appearing lady resting in bed She is on BiPAP She is on Precedex Monitor noted Diminished breath sounds Abdomen is not distended She does not have edema Not awake, not alert  Vital Signs: BP (!) 174/86   Pulse (!) 103   Temp (!) 97.2 F (36.2 C) (Axillary)   Resp (!) 28   Ht '5\' 2"'$  (1.575 m)   Wt 56.7 kg   SpO2 94%   BMI 22.86 kg/m  Pain Scale: CPOT POSS *See Group Information*:  S-Acceptable,Sleep, easy to arouse Pain Score: Asleep   SpO2: SpO2: 94 % O2 Device:SpO2: 94 % O2 Flow Rate: .O2 Flow Rate (L/min): 6 L/min  IO: Intake/output summary:  Intake/Output Summary (Last 24 hours) at 01-16-23 1136 Last data filed at 01/16/2023 0800 Gross per 24 hour  Intake 2808.92 ml  Output 700 ml  Net 2108.92 ml    LBM: Last BM Date : 01/05/23 Baseline Weight: Weight: 56.7 kg Most recent weight: Weight: 56.7 kg     Palliative Assessment/Data:   PPS 30%  Time In:  10.30 Time Out:  11.30 Time Total:  60  Greater than 50%  of this time was spent counseling and coordinating care related to the above assessment and plan.  Signed by: Loistine Chance, MD   Please contact Palliative Medicine Team phone at [phone] for questions and concerns.  For individual provider: See Shea Evans

## 2023-01-15 NOTE — Progress Notes (Signed)
  Echocardiogram 2D Echocardiogram has been performed.  Brittany Douglas 01/10/23, 9:11 AM

## 2023-01-15 NOTE — Progress Notes (Addendum)
NAME:  Brittany Douglas, MRN:  [id number], DOB:  [birth year], LOS: 5 ADMISSION DATE:  12/30/2022, CONSULTATION DATE:  1/23 REFERRING MD: Dr. Darrick Meigs, CHIEF COMPLAINT:  worsening pneumonia   History of Present Illness:  Brittany Douglas is a 82 y.o. F with PMH of HTN, HL, DJD, CKD stage II with recent admission 1/6-1/11 after a fall and subsequent R hip fracture s/p arthroplasty on 1/7.  She was diagnosed with pneumonia during that admission and discharge with Azithromycin and Omnicef for a total of 5 days to SNF.   She was feeling improved until a few days prior to admission when she had worsening cough and shortness of breath so presented to  the ED on 1/20.  CTA was negative for PE, but showed increasing progressive ground glass opacities and consolidation.  She was admitted and initially treated with Vanc/Cefepime which was narrowed to Cefepime after MRSA was negative.  Since admission her CXR was worsened and she has required Bipap along with developing Afib RVR.   PCCM consulted in this setting, she is DNR.  Pt denies any fever or chills, she is coughing up some sputum.   No chest pain or history of smoking, she worked as an Optometrist.   Pertinent  Medical History   has a past medical history of Anxiety, Aortic atherosclerosis ([place]), Avascular necrosis of femur, right ([place]), Balance problems, Chronic kidney disease, stage II (mild), DDD (degenerative disc disease), lumbosacral, Depression, DJD (degenerative joint disease), lumbar, Elevated cholesterol, GERD (gastroesophageal reflux disease), HOH (hard of hearing), Hypertension, Hypertensive renal disease, benign, Hypokalemia, Hyponatremia, Hypothyroidism, IBS (irritable bowel syndrome), Major depression, recurrent ([place]), Osteopenia, Osteoporosis, Post-menopausal, Reflux, Subclinical hypothyroidism, Varicose vein of leg, and Vitamin D deficiency.   Significant Hospital Events: Including procedures, antibiotic start and stop dates in addition to  other pertinent events   1/20 admit to [facility] with pneumonia 1/23 PCCM consult due to worsening dyspnea 1/24 difficulty tolerating bipap overnight, remains on precedex, desaturates to high 80's  Interim History / Subjective:  Afebrile  Bipap - 70% fiO2 Glucose range - 121-203 I/O UOP 700 ml, +1.9L in last 24 hours Remains on precedex  Altered overnight > CT imaging negative.     Objective   Blood pressure (!) 174/86, pulse (!) 102, temperature 98.3 F (36.8 C), temperature source Axillary, resp. rate (!) 25, height '5\' 2"'$  (1.575 m), weight 56.7 kg, SpO2 97 %.    FiO2 (%):  [50 %-70 %] 70 %   Intake/Output Summary (Last 24 hours) at 2023/01/10 0813 Last data filed at 01-10-23 0102 Gross per 24 hour  Intake 2611.55 ml  Output 700 ml  Net 1911.55 ml   Filed Weights   12/30/2022 0753  Weight: 56.7 kg    Exam:  General: frail elderly female lying in bed on bipap, precedex resting comfortably HEENT: MM pink/dry, bipap mask in place, anicteric  Neuro: sedate, comfortable  CV: s1s2 RRR, no m/r/g PULM: non-labored at rest, lungs bilaterally diminished  GI: soft, bsx4 active  Extremities: warm/dry, no edema  Skin: no rashes or lesions  Resolved Hospital Problem list     Assessment & Plan:   Acute Hypoxic Respiratory Failure secondary to Multifocal Pneumonia HFpEF  Anxiety HCAP in the setting of recent admission and worse after outpatient abx.  COVID/RSV/Flu and viral panel negative. Consider BOOP/COP given recent admission, possible aspiration with organizing PNA after? PCT negative. Diuresed, no further room for additional at this point. Urine strep, legionella negative.  -continue bipap for support -medications for  comfort as needed  -patients son arriving today, likely will transition to full comfort this afternoon  -continue precedex  -continue cefepime, azithromycin  -continue solumedrol 40 mg IV BID -follow sputum culture -DNR / DNI -valium PRN  Atrial Fibrillation  ruled out. MAT per Cardiology  Demand Ischemia  New AF this admit, possible demand ischemia  -appreciate Cardiology  -continue metoprolol  -follow up ECHO  Acute Metabolic Encephalopathy  Code stroke activated overnight 01-10-23, stroke imaging negative January 10, 2023.  -follow neuro exam  -supportive care   GOC Anticipate son's arrival 01-10-2023, likely transition to comfort care   Best Practice (right click and "Reselect all SmartList Selections" daily)  Diet/type: clear liquids DVT prophylaxis: prophylactic heparin  GI prophylaxis: N/A Lines: N/A Foley:  N/A Code Status:  DNR Last date of multidisciplinary goals of care discussion {updated pt's daughter at the bedside]  Critical care time:  35 minutes    Noe Gens, MSN, APRN, NP-C, AGACNP-BC [facility] 01-10-23, 8:13 AM   Please see [email or web address] for pager details.   From 7A-7P if no response, please call [phone] After hours, please call ELink [phone]

## 2023-01-15 NOTE — Progress Notes (Signed)
This RN wasted 76m of hydromorphone drip with DJosph Macho RN.

## 2023-01-15 DEATH — deceased

## 2023-02-13 NOTE — Death Summary Note (Signed)
DEATH SUMMARY   Patient Details  Name: Brittany Douglas MRN: [id number] DOB: 11-29-1941  Admission/Discharge Information   Admit Date:  February 02, 2023  Date of Death: Date of Death: 07-Feb-2023  Time of Death: Time of Death: 29  Length of Stay: 5  Referring Physician: [name], MD     Diagnoses  Acute hypoxemic respiratory failure due to ARDS Multifocal atrial tachycardia Acute metabolic encephalopathy Recent R hip fracture with arthroplasty HTN, HL, DJD, CKD stage II    Brief Hospital Course (including significant findings, care, treatment, and services provided and events leading to death)  Brittany Douglas is a 82 y.o. F with PMH of HTN, HL, DJD, CKD stage II with recent admission 1/6-1/11 after a fall and subsequent R hip fracture s/p arthroplasty on 1/7.  She was diagnosed with pneumonia during that admission and discharge with Azithromycin and Omnicef for a total of 5 days to SNF.   She was feeling improved until a few days prior to admission when she had worsening cough and shortness of breath so presented to  the ED on February 02, 2023.  CTA was negative for PE, but showed increasing progressive ground glass opacities and consolidation.  She was admitted and initially treated with Vanc/Cefepime which was narrowed to Cefepime after MRSA was negative.  Since admission her CXR was worsened and she has required Bipap along with developing Afib RVR.   PCCM consulted in this setting, she is DNR.  Pt denies any fever or chills, she is coughing up some sputum.   No chest pain or history of smoking, she worked as an Optometrist.   Despite steroids, lasix, broad spectrum abx patient continued to deteriorate.  Family came in and we transitioned Maxeys to comfort.  Passed peacefully with family at bedside.  Pertinent Labs and Studies  Significant Diagnostic Studies ECHOCARDIOGRAM COMPLETE  Result Date: Feb 07, 2023    ECHOCARDIOGRAM REPORT   Patient Name:   Brittany Douglas Date of Exam:  02-07-23 Medical Rec #:  [id number]             Height:       62.0 in Accession #:    [id number]            Weight:       125.0 lb Date of Birth:  01/08/1941             BSA:          1.566 m Patient Age:    69 years              BP:           174/86 mmHg Patient Gender: F                     HR:           106 bpm. Exam Location:  Inpatient Procedure: 2D Echo, Cardiac Doppler and Color Doppler STAT ECHO Indications:    Cardiomyopathy, ischemic  History:        Patient has prior history of Echocardiogram examinations, most                 recent 12/25/2022. Arrythmias:Tachycardia; Risk                 Factors:Hypertension. CKD, hypertensive renal disease.  Sonographer:    Eartha Inch Referring Phys: [id number] [place]  1. Left ventricular ejection fraction, by estimation, is 50 to 55%. Left ventricular ejection fraction by 2D MOD biplane is  52.0 %. The left ventricle has normal function. The left ventricle demonstrates global hypokinesis. There is mild asymmetric left  ventricular hypertrophy of the lateral segment. Left ventricular diastolic parameters are consistent with Grade II diastolic dysfunction (pseudonormalization).  2. Right ventricular systolic function is normal. The right ventricular size is mildly enlarged. There is severely elevated pulmonary artery systolic pressure.  3. Left atrial size was mildly dilated.  4. Right atrial size was moderately dilated.  5. The mitral valve is degenerative. Moderate mitral valve regurgitation. Moderate to severe mitral annular calcification.  6. Tricuspid valve regurgitation is moderate.  7. The aortic valve is normal in structure. Aortic valve regurgitation is trivial.  8. The inferior vena cava is dilated in size with <50% respiratory variability, suggesting right atrial pressure of 15 mmHg. Conclusion(s)/Recommendation(s): Compared to prior echo , EF is mildly more reduced and RVSP has increased. FINDINGS  Left Ventricle: Left ventricular  ejection fraction, by estimation, is 50 to 55%. Left ventricular ejection fraction by 2D MOD biplane is 52.0 %. The left ventricle has normal function. The left ventricle demonstrates global hypokinesis. The left ventricular internal cavity size was normal in size. There is mild asymmetric left ventricular hypertrophy of the lateral segment. Left ventricular diastolic parameters are consistent with Grade II diastolic dysfunction (pseudonormalization). Right Ventricle: The right ventricular size is mildly enlarged. No increase in right ventricular wall thickness. Right ventricular systolic function is normal. There is severely elevated pulmonary artery systolic pressure. The tricuspid regurgitant velocity is 3.66 m/s, and with an assumed right atrial pressure of 15 mmHg, the estimated right ventricular systolic pressure is 20.2 mmHg. Left Atrium: Left atrial size was mildly dilated. Right Atrium: Right atrial size was moderately dilated. Pericardium: There is no evidence of pericardial effusion. Mitral Valve: The mitral valve is degenerative in appearance. There is severe calcification of the mitral valve leaflet(s). Moderate to severe mitral annular calcification. Moderate mitral valve regurgitation. Tricuspid Valve: The tricuspid valve is normal in structure. Tricuspid valve regurgitation is moderate. Aortic Valve: The aortic valve is normal in structure. Aortic valve regurgitation is trivial. Pulmonic Valve: The pulmonic valve was not well visualized. Pulmonic valve regurgitation is not visualized. Aorta: The aortic root and ascending aorta are structurally normal, with no evidence of dilitation. Venous: The inferior vena cava is dilated in size with less than 50% respiratory variability, suggesting right atrial pressure of 15 mmHg. IAS/Shunts: No atrial level shunt detected by color flow Doppler.  LEFT VENTRICLE PLAX 2D                        Biplane EF (MOD) LVIDd:         3.70 cm         LV Biplane EF:   Left  LVIDs:         2.70 cm                          ventricular LV PW:         1.20 cm                          ejection LV IVS:        1.00 cm                          fraction by LVOT diam:     1.80 cm  2D MOD LV SV:         42                               biplane is LV SV Index:   27                               52.0 %. LVOT Area:     2.54 cm                                Diastology                                LV e' medial:    5.55 cm/s LV Volumes (MOD)               LV E/e' medial:  25.8 LV vol d, MOD    82.6 ml       LV e' lateral:   7.51 cm/s A2C:                           LV E/e' lateral: 19.0 LV vol d, MOD    69.4 ml A4C: LV vol s, MOD    36.4 ml A2C: LV vol s, MOD    33.8 ml A4C: LV SV MOD A2C:   46.2 ml LV SV MOD A4C:   69.4 ml LV SV MOD BP:    40.4 ml RIGHT VENTRICLE             IVC RV S prime:     10.80 cm/s  IVC diam: 2.10 cm TAPSE (M-mode): 2.0 cm LEFT ATRIUM             Index        RIGHT ATRIUM           Index LA diam:        3.30 cm 2.11 cm/m   RA Area:     17.10 cm LA Vol (A2C):   81.8 ml 52.25 ml/m  RA Volume:   52.00 ml  33.22 ml/m LA Vol (A4C):   39.4 ml 25.17 ml/m LA Biplane Vol: 59.1 ml 37.75 ml/m  AORTIC VALVE LVOT Vmax:   93.30 cm/s LVOT Vmean:  64.000 cm/s LVOT VTI:    0.166 m  AORTA Ao Root diam: 2.90 cm Ao Asc diam:  3.50 cm MITRAL VALVE                  TRICUSPID VALVE MV Area (PHT): 4.65 cm       TR Peak grad:   53.6 mmHg MV Decel Time: 163 msec       TR Mean grad:   36.0 mmHg MR Peak grad:    153.3 mmHg   TR Vmax:        366.00 cm/s MR Mean grad:    104.0 mmHg   TR Vmean:       286.0 cm/s MR Vmax:         619.00 cm/s MR Vmean:        489.0 cm/s   SHUNTS MR PISA:         1.01 cm     Systemic VTI:  0.17 m MR PISA Eff  ROA: 5 mm        Systemic Diam: 1.80 cm MR PISA Radius:  0.40 cm MV E velocity: 143.00 cm/s MV A velocity: 89.30 cm/s MV E/A ratio:  1.60 Placido Sou signed by Phineas Inches Signature Date/Time: Jan 10, 2023/9:45:33 AM    Final     CT ANGIO HEAD W OR WO CONTRAST  Result Date: 01/10/23 CLINICAL DATA:  Initial evaluation for neuro deficit, stroke suspected. EXAM: CT ANGIOGRAPHY HEAD AND NECK TECHNIQUE: Multidetector CT imaging of the head and neck was performed using the standard protocol during bolus administration of intravenous contrast. Multiplanar CT image reconstructions and MIPs were obtained to evaluate the vascular anatomy. Carotid stenosis measurements (when applicable) are obtained utilizing NASCET criteria, using the distal internal carotid diameter as the denominator. RADIATION DOSE REDUCTION: This exam was performed according to the departmental dose-optimization program which includes automated exposure control, adjustment of the mA and/or kV according to patient size and/or use of iterative reconstruction technique. CONTRAST:  61m OMNIPAQUE IOHEXOL 350 MG/ML SOLN COMPARISON:  Prior CT from earlier the same day. FINDINGS: CTA NECK FINDINGS Aortic arch: Partially visualized aortic arch within normal limits for caliber. Moderate atheromatous change about the arch itself. No visible significant stenosis about the origin the great vessels. Right carotid system: Right common and internal carotid arteries are patent without dissection. Bulky calcified plaque about the right carotid bulb without hemodynamically significant greater than 50% stenosis. Left carotid system: Left common and internal carotid arteries are patent without dissection. Bulky calcified plaque about the left carotid bulb with associated stenosis of up to 60% by NASCET criteria (series 8, image 112). Vertebral arteries: Both vertebral arteries arise from the subclavian arteries. No proximal subclavian artery stenosis. Vertebral arteries patent without dissection or significant stenosis. Skeleton: No discrete or worrisome osseous lesions. Moderately advanced spondylosis present at C3-4 through C6-7. Other neck: No other acute soft tissue abnormality within the  neck. Probable trace secretions noted within the subglottic airway. Upper chest: Layering bilateral pleural effusions, partially visualized. Extensive multifocal parenchymal opacities within the visualized lungs, suspicious for infection/pneumonia. Review of the MIP images confirms the above findings CTA HEAD FINDINGS Anterior circulation: Examination degraded by motion and positioning. Moderate atheromatous change within the carotid siphons without high-grade stenosis. 2 mm outpouching extending laterally from the cavernous right ICA, consistent with a small aneurysm (series 8, image 220). Additional tiny 2-3 mm outpouching arising from the contralateral cavernous left ICA could also reflect a small aneurysm or possibly vascular infundibulum (series 8, image 217). A1 segments patent bilaterally. Normal anterior communicating complex. Anterior cerebral arteries patent without stenosis. No M1 stenosis or occlusion. No proximal MCA branch occlusion or high-grade stenosis. Distal MCA branches perfused and symmetric. Posterior circulation: Atheromatous change within the V4 segments without high-grade stenosis. Left vertebral artery slightly dominant. Both PICA patent. Basilar patent without stenosis superior cerebellar and posterior cerebral arteries patent bilaterally. Venous sinuses: Grossly patent allowing for timing the contrast bolus. Anatomic variants: As above. Review of the MIP images confirms the above findings IMPRESSION: 1. Negative CTA for large vessel occlusion or other emergent finding. 2. Bulky calcified plaque about the left carotid bulb with associated stenosis of up to 60% by NASCET criteria. 3. Bulky calcified plaque about the right carotid bulb without hemodynamically significant greater than 50% stenosis. 4. 2 mm cavernous right ICA aneurysm. 2-3 mm outpouching at the contralateral cavernous left ICA could reflect an additional small aneurysm or vascular infundibulum. Attention at follow-up  recommended. 5. Extensive multifocal parenchymal  opacities within the visualized lungs, suspicious for infection/pneumonia. 6. Layering bilateral pleural effusions, partially visualized. Electronically Signed   By: Jeannine Boga M.D.   On: 2023-01-18 02:39   CT ANGIO NECK W OR WO CONTRAST  Result Date: 01-18-23 CLINICAL DATA:  Initial evaluation for neuro deficit, stroke suspected. EXAM: CT ANGIOGRAPHY HEAD AND NECK TECHNIQUE: Multidetector CT imaging of the head and neck was performed using the standard protocol during bolus administration of intravenous contrast. Multiplanar CT image reconstructions and MIPs were obtained to evaluate the vascular anatomy. Carotid stenosis measurements (when applicable) are obtained utilizing NASCET criteria, using the distal internal carotid diameter as the denominator. RADIATION DOSE REDUCTION: This exam was performed according to the departmental dose-optimization program which includes automated exposure control, adjustment of the mA and/or kV according to patient size and/or use of iterative reconstruction technique. CONTRAST:  76m OMNIPAQUE IOHEXOL 350 MG/ML SOLN COMPARISON:  Prior CT from earlier the same day. FINDINGS: CTA NECK FINDINGS Aortic arch: Partially visualized aortic arch within normal limits for caliber. Moderate atheromatous change about the arch itself. No visible significant stenosis about the origin the great vessels. Right carotid system: Right common and internal carotid arteries are patent without dissection. Bulky calcified plaque about the right carotid bulb without hemodynamically significant greater than 50% stenosis. Left carotid system: Left common and internal carotid arteries are patent without dissection. Bulky calcified plaque about the left carotid bulb with associated stenosis of up to 60% by NASCET criteria (series 8, image 112). Vertebral arteries: Both vertebral arteries arise from the subclavian arteries. No proximal  subclavian artery stenosis. Vertebral arteries patent without dissection or significant stenosis. Skeleton: No discrete or worrisome osseous lesions. Moderately advanced spondylosis present at C3-4 through C6-7. Other neck: No other acute soft tissue abnormality within the neck. Probable trace secretions noted within the subglottic airway. Upper chest: Layering bilateral pleural effusions, partially visualized. Extensive multifocal parenchymal opacities within the visualized lungs, suspicious for infection/pneumonia. Review of the MIP images confirms the above findings CTA HEAD FINDINGS Anterior circulation: Examination degraded by motion and positioning. Moderate atheromatous change within the carotid siphons without high-grade stenosis. 2 mm outpouching extending laterally from the cavernous right ICA, consistent with a small aneurysm (series 8, image 220). Additional tiny 2-3 mm outpouching arising from the contralateral cavernous left ICA could also reflect a small aneurysm or possibly vascular infundibulum (series 8, image 217). A1 segments patent bilaterally. Normal anterior communicating complex. Anterior cerebral arteries patent without stenosis. No M1 stenosis or occlusion. No proximal MCA branch occlusion or high-grade stenosis. Distal MCA branches perfused and symmetric. Posterior circulation: Atheromatous change within the V4 segments without high-grade stenosis. Left vertebral artery slightly dominant. Both PICA patent. Basilar patent without stenosis superior cerebellar and posterior cerebral arteries patent bilaterally. Venous sinuses: Grossly patent allowing for timing the contrast bolus. Anatomic variants: As above. Review of the MIP images confirms the above findings IMPRESSION: 1. Negative CTA for large vessel occlusion or other emergent finding. 2. Bulky calcified plaque about the left carotid bulb with associated stenosis of up to 60% by NASCET criteria. 3. Bulky calcified plaque about the right  carotid bulb without hemodynamically significant greater than 50% stenosis. 4. 2 mm cavernous right ICA aneurysm. 2-3 mm outpouching at the contralateral cavernous left ICA could reflect an additional small aneurysm or vascular infundibulum. Attention at follow-up recommended. 5. Extensive multifocal parenchymal opacities within the visualized lungs, suspicious for infection/pneumonia. 6. Layering bilateral pleural effusions, partially visualized. Electronically Signed   By: BJeannine Boga  M.D.   On: 01/12/2023 02:39   CT HEAD CODE STROKE WO CONTRAST  Result Date: 01-12-23 CLINICAL DATA:  Code stroke. Initial evaluation for neuro deficit, stroke suspected. EXAM: CT HEAD WITHOUT CONTRAST TECHNIQUE: Contiguous axial images were obtained from the base of the skull through the vertex without intravenous contrast. RADIATION DOSE REDUCTION: This exam was performed according to the departmental dose-optimization program which includes automated exposure control, adjustment of the mA and/or kV according to patient size and/or use of iterative reconstruction technique. COMPARISON:  Prior study from 06/21/2022. FINDINGS: Brain: Examination degraded by motion artifact. Age-related cerebral atrophy with chronic small vessel ischemic disease. No acute intracranial hemorrhage. No acute large vessel territory infarct. No mass lesion, midline shift or mass effect. No hydrocephalus or extra-axial fluid collection. Vascular: No abnormal hyperdense vessel. Calcified atherosclerosis present at the skull base. Skull: Scalp soft tissues and calvarium within normal limits. Sinuses/Orbits: Globes orbital soft tissues demonstrate no acute finding. Paranasal sinuses are clear. No mastoid effusion. Other: None. ASPECTS [facility] Stroke Program Early CT Score) - Ganglionic level infarction (caudate, lentiform nuclei, internal capsule, insula, M1-M3 cortex): 7 - Supraganglionic infarction (M4-M6 cortex): 3 Total score (0-10 with 10  being normal): 10 IMPRESSION: 1. No acute intracranial abnormality. 2. ASPECTS is 10. 3. Age-related cerebral atrophy with chronic small vessel ischemic disease. Results were called by telephone at the time of interpretation on 12-Jan-2023 at 1:15 am to provider Trevor Mace , who verbally acknowledged these results. Electronically Signed   By: Jeannine Boga M.D.   On: 2023-01-12 01:18   DG Chest Port 1V same Day  Result Date: 01/06/2023 CLINICAL DATA:  Pneumonia EXAM: PORTABLE CHEST 1 VIEW COMPARISON:  12/25/2022 FINDINGS: The heart size and mediastinal diffuse interstitial and alveolar opacity consistent with worsening pulmonary edema and/or diffuse bilateral pneumonia. There is suggestion of small pleural effusions. No pneumothorax identified. Calcified aorta. IMPRESSION: Diffuse interstitial and alveolar opacities with interval worsening. Electronically Signed   By: Sammie Bench M.D.   On: 01/06/2023 08:36   CT Angio Chest Pulmonary Embolism (PE) W or WO Contrast  Result Date: 01/04/2023 CLINICAL DATA:  Evaluate for pulmonary embolism. Acute shortness of breath. EXAM: CT ANGIOGRAPHY CHEST WITH CONTRAST TECHNIQUE: Multidetector CT imaging of the chest was performed using the standard protocol during bolus administration of intravenous contrast. Multiplanar CT image reconstructions and MIPs were obtained to evaluate the vascular anatomy. RADIATION DOSE REDUCTION: This exam was performed according to the departmental dose-optimization program which includes automated exposure control, adjustment of the mA and/or kV according to patient size and/or use of iterative reconstruction technique. CONTRAST:  90m OMNIPAQUE IOHEXOL 350 MG/ML SOLN COMPARISON:  12/24/2022 FINDINGS: Cardiovascular: There is satisfactory opacification of the pulmonary arteries. Exam limited by motion artifact. No signs of acute pulmonary embolus. Heart size is upper limits of normal. No pericardial effusion. Aortic  atherosclerosis and coronary artery calcifications. Mediastinum/Nodes: Thyroid gland, trachea and esophagus are unremarkable. No enlarged axillary, mediastinal, or hilar lymph nodes. Lungs/Pleura: Small to moderate bilateral pleural effusions are identified. These are increased in volume compared with the previous exam. Extensive, progressive scratch compared with 12/24/2022 there is extensive, progressive patchy ground-glass and organizing consolidation. Upper Abdomen: No acute abnormality. Musculoskeletal: Chronic appearing superior endplate fracture within the upper lumbar region. Unchanged from previous exam. No acute or suspicious osseous findings. Review of the MIP images confirms the above findings. IMPRESSION: 1. Exam limited by motion artifact. No signs of acute pulmonary embolus. 2. Small to moderate bilateral pleural effusions are identified.  These are increased in volume compared with the previous exam. 3. Extensive, progressive patchy ground-glass and organizing consolidation is identified within both lungs. Imaging findings are concerning for progressive widespread bronchopneumonia. Atypical infection, including COVID infection is not excluded. 4. Coronary artery calcifications. 5. Chronic appearing superior endplate fracture within the upper lumbar region. Unchanged from previous exam. 6.  Aortic Atherosclerosis (ICD10-I70.0). Electronically Signed   By: Kerby Moors M.D.   On: 12/31/2022 14:00   DG Chest Port 1 View  Result Date: 12/15/2022 CLINICAL DATA:  Possible sepsis.  Diagnosed pneumonia 1 month ago. EXAM: PORTABLE CHEST 1 VIEW COMPARISON:  12/23/2022 FINDINGS: Lungs are adequately inflated demonstrate new bilateral patchy airspace process likely multifocal infection. No effusion or pneumothorax. Cardiomediastinal silhouette and remainder of the exam is unchanged. IMPRESSION: New bilateral patchy airspace process likely multifocal infection. Electronically Signed   By: Marin Olp M.D.    On: 12/20/2022 08:45   ECHOCARDIOGRAM COMPLETE  Result Date: 12/25/2022    ECHOCARDIOGRAM REPORT   Patient Name:   Brittany Douglas Date of Exam: 12/25/2022 Medical Rec #:  [id number]             Height:       62.0 in Accession #:    [id number]            Weight:       125.0 lb Date of Birth:  1941/10/03             BSA:          1.566 m Patient Age:    52 years              BP:           142/62 mmHg Patient Gender: F                     HR:           85 bpm. Exam Location:  Inpatient Procedure: 2D Echo Indications:    CHF  History:        Patient has no prior history of Echocardiogram examinations.                 Risk Factors:Hypertension.  Sonographer:    Harvie Junior Referring Phys: [id number] Vernelle Emerald  Sonographer Comments: Technically difficult study due to poor echo windows. Image acquisition challenging due to respiratory motion. IMPRESSIONS  1. Left ventricular ejection fraction, by estimation, is 65%. The left ventricle has normal function. Left ventricular endocardial border not optimally defined to evaluate regional wall motion. There is mild left ventricular hypertrophy. Left ventricular diastolic parameters are consistent with Grade I diastolic dysfunction (impaired relaxation).  2. Right ventricular systolic function is normal. The right ventricular size is normal. There is mildly elevated pulmonary artery systolic pressure.  3. The mitral valve is grossly normal. No evidence of mitral valve regurgitation. The mean mitral valve gradient is 2.0 mmHg with average heart rate of 86 bpm. Severe mitral annular calcification.  4. The aortic valve was not well visualized. There is mild calcification of the aortic valve. Aortic valve regurgitation is not visualized. Aortic valve sclerosis is present, with no evidence of aortic valve stenosis.  5. The inferior vena cava is normal in size with greater than 50% respiratory variability, suggesting right atrial pressure of 3 mmHg. Comparison(s): No  prior Echocardiogram. FINDINGS  Left Ventricle: Left ventricular ejection fraction, by estimation, is 65%. The left ventricle has normal function. Left ventricular endocardial border  not optimally defined to evaluate regional wall motion. The left ventricular internal cavity size was small. There is mild left ventricular hypertrophy. Left ventricular diastolic parameters are consistent with Grade I diastolic dysfunction (impaired relaxation). Right Ventricle: The right ventricular size is normal. No increase in right ventricular wall thickness. Right ventricular systolic function is normal. There is mildly elevated pulmonary artery systolic pressure. The tricuspid regurgitant velocity is 3.00  m/s, and with an assumed right atrial pressure of 3 mmHg, the estimated right ventricular systolic pressure is 35.3 mmHg. Left Atrium: Left atrial size was normal in size. Right Atrium: Right atrial size was normal in size. Pericardium: There is no evidence of pericardial effusion. Mitral Valve: The mitral valve is grossly normal. Severe mitral annular calcification. No evidence of mitral valve regurgitation. MV peak gradient, 8.6 mmHg. The mean mitral valve gradient is 2.0 mmHg with average heart rate of 86 bpm. Tricuspid Valve: The tricuspid valve is normal in structure. Tricuspid valve regurgitation is mild . No evidence of tricuspid stenosis. Aortic Valve: The aortic valve was not well visualized. There is mild calcification of the aortic valve. There is mild aortic valve annular calcification. Aortic valve regurgitation is not visualized. Aortic valve sclerosis is present, with no evidence of aortic valve stenosis. Aortic valve mean gradient measures 6.0 mmHg. Aortic valve peak gradient measures 13.1 mmHg. Aortic valve area, by VTI measures 2.10 cm. Pulmonic Valve: The pulmonic valve was not well visualized. Pulmonic valve regurgitation is not visualized. No evidence of pulmonic stenosis. Aorta: The aortic root is normal  in size and structure. Venous: The inferior vena cava is normal in size with greater than 50% respiratory variability, suggesting right atrial pressure of 3 mmHg. IAS/Shunts: No atrial level shunt detected by color flow Doppler.  LEFT VENTRICLE PLAX 2D LVIDd:         3.15 cm     Diastology LVIDs:         1.80 cm     LV e' medial:    6.53 cm/s LV PW:         1.00 cm     LV E/e' medial:  13.7 LV IVS:        1.00 cm     LV e' lateral:   8.05 cm/s LVOT diam:     1.80 cm     LV E/e' lateral: 11.1 LV SV:         67 LV SV Index:   43 LVOT Area:     2.54 cm  LV Volumes (MOD) LV vol d, MOD A2C: 57.2 ml LV vol d, MOD A4C: 68.9 ml LV vol s, MOD A2C: 19.4 ml LV vol s, MOD A4C: 25.5 ml LV SV MOD A2C:     37.8 ml LV SV MOD A4C:     68.9 ml LV SV MOD BP:      40.0 ml RIGHT VENTRICLE RV Basal diam:  3.10 cm RV Mid diam:    2.40 cm RV S prime:     22.40 cm/s TAPSE (M-mode): 3.0 cm LEFT ATRIUM             Index        RIGHT ATRIUM           Index LA diam:        2.50 cm 1.60 cm/m   RA Area:     11.50 cm LA Vol (A2C):   61.0 ml 38.96 ml/m  RA Volume:   27.20 ml  17.37 ml/m LA  Vol (A4C):   36.3 ml 23.19 ml/m LA Biplane Vol: 47.9 ml 30.60 ml/m  AORTIC VALVE                     PULMONIC VALVE AV Area (Vmax):    2.01 cm      PV Vmax:       0.98 m/s AV Area (Vmean):   2.13 cm      PV Peak grad:  3.8 mmHg AV Area (VTI):     2.10 cm AV Vmax:           181.00 cm/s AV Vmean:          113.000 cm/s AV VTI:            0.317 m AV Peak Grad:      13.1 mmHg AV Mean Grad:      6.0 mmHg LVOT Vmax:         143.00 cm/s LVOT Vmean:        94.700 cm/s LVOT VTI:          0.262 m LVOT/AV VTI ratio: 0.83  AORTA Ao Root diam: 2.80 cm MITRAL VALVE                TRICUSPID VALVE MV Area (PHT): 3.65 cm     TR Peak grad:   36.0 mmHg MV Area VTI:   1.52 cm     TR Vmax:        300.00 cm/s MV Peak grad:  8.6 mmHg MV Mean grad:  2.0 mmHg     SHUNTS MV Vmax:       1.47 m/s     Systemic VTI:  0.26 m MV Vmean:      61.3 cm/s    Systemic Diam: 1.80 cm MV  Decel Time: 208 msec MR Peak grad: 66.2 mmHg MR Vmax:      406.67 cm/s MV E velocity: 89.20 cm/s MV A velocity: 155.00 cm/s MV E/A ratio:  0.58 Rudean Haskell MD Electronically signed by Rudean Haskell MD Signature Date/Time: 12/25/2022/11:21:05 AM    Final    CT Angio Chest Pulmonary Embolism (PE) W or WO Contrast  Result Date: 12/24/2022 CLINICAL DATA:  Pulmonary embolism suspected. High probability. Hypoxia. EXAM: CT ANGIOGRAPHY CHEST WITH CONTRAST TECHNIQUE: Multidetector CT imaging of the chest was performed using the standard protocol during bolus administration of intravenous contrast. Multiplanar CT image reconstructions and MIPs were obtained to evaluate the vascular anatomy. RADIATION DOSE REDUCTION: This exam was performed according to the departmental dose-optimization program which includes automated exposure control, adjustment of the mA and/or kV according to patient size and/or use of iterative reconstruction technique. CONTRAST:  82m OMNIPAQUE IOHEXOL 350 MG/ML SOLN COMPARISON:  Chest radiography yesterday. FINDINGS: Cardiovascular: Mild cardiac enlargement. Extensive coronary artery calcification and aortic atherosclerotic calcification. Pulmonary arterial opacification is good. There are no pulmonary emboli. Mediastinum/Nodes: No mediastinal or hilar mass or lymphadenopathy. Dilated fluid-filled esophagus is present. Lungs/Pleura: Small bilateral effusions layering dependently with dependent atelectasis. Interstitial prominence suggesting mild interstitial edema. Patchy bilateral pulmonary infiltrates, slightly upper lobe predominant, consistent with widespread bronchopneumonia. This pattern can be seen with viral pneumonia including coronavirus Disease. No lobar consolidation. No sign of underlying mass lesion. Upper Abdomen: Negative Musculoskeletal: Ordinary thoracic degenerative changes. Old appearing superior endplate fracture in the upper lumbar region. Review of the MIP  images confirms the above findings. IMPRESSION: 1. No pulmonary emboli. 2. Mild cardiac enlargement. Extensive coronary artery calcification and aortic atherosclerotic calcification. 3.  Small bilateral pleural effusions layering dependently with dependent atelectasis. Interstitial prominence suggesting mild interstitial edema. 4. Patchy bilateral pulmonary infiltrates, slightly upper lobe predominant, consistent with widespread bronchopneumonia. This pattern can be seen with viral pneumonia including coronavirus. 5. Dilated fluid-filled esophagus. 6. Aortic atherosclerosis. Aortic Atherosclerosis (ICD10-I70.0). Electronically Signed   By: Nelson Chimes M.D.   On: 12/24/2022 11:57   DG Chest 2 View  Result Date: 12/23/2022 CLINICAL DATA:  Hospital-acquired pneumonia. EXAM: CHEST - 2 VIEW COMPARISON:  12/20/2022 x-ray and older FINDINGS: Calcified aorta. Underinflation with diffuse interstitial changes again noted. Subtle left lung base opacity. Minimal patchy right midlung opacity as well. Subtle infiltrate is possible. No pneumothorax or edema. Small effusions on the lateral view. Stable cardiopericardial silhouette. Degenerative changes. IMPRESSION: Worsening inflation with tiny effusions. Subtle areas of opacity right mid lung and left lung base. Recommend follow-up Electronically Signed   By: Jill Side M.D.   On: 12/23/2022 13:07   DG HIP UNILAT WITH PELVIS 2-3 VIEWS RIGHT  Result Date: 12/22/2022 CLINICAL DATA:  Status post total right hip arthroplasty. EXAM: DG HIP (WITH OR WITHOUT PELVIS) 2-3V RIGHT COMPARISON:  AP pelvis 12/21/2022 FINDINGS: Redemonstration of recent total right hip arthroplasty. No perihardware lucency is seen to indicate hardware failure or loosening. Expected postoperative subcutaneous air about the proximal right thigh is slightly decreased from prior. Mild left femoroacetabular joint space narrowing and superolateral left acetabular degenerative osteophytes. The bilateral  sacroiliac joint spaces are maintained. Mild pubic symphysis joint space narrowing and subchondral sclerosis. IMPRESSION: Status post total right hip arthroplasty without evidence of hardware failure. Electronically Signed   By: Yvonne Kendall M.D.   On: 12/22/2022 08:27   DG Pelvis Portable  Result Date: 12/21/2022 CLINICAL DATA:  Status post hip arthroplasty. EXAM: PORTABLE PELVIS 1-2 VIEWS COMPARISON:  Same day hip radiographs FINDINGS: The patient is status post a total hip arthroplasty on the right. The hardware appears intact and well aligned. Soft tissue gas overlies the operative site. Degenerative changes are seen in the spine and left hip. IMPRESSION: Postoperative appearance of total hip arthroplasty on the right. Electronically Signed   By: Zerita Boers M.D.   On: 12/21/2022 17:48   DG HIP UNILAT WITH PELVIS 1V RIGHT  Result Date: 12/21/2022 CLINICAL DATA:  Right hip replacement. EXAM: DG HIP (WITH OR WITHOUT PELVIS) 1V RIGHT COMPARISON:  12/20/2022 FINDINGS: 2 intraoperative spot fluoro images obtained in AP projection of the right hip. Status post arthroplasty. No evidence for immediate hardware complication. IMPRESSION: Status post right hip arthroplasty. No evidence for immediate hardware complication. Electronically Signed   By: Misty Stanley M.D.   On: 12/21/2022 11:36   DG C-Arm 1-60 Min-No Report  Result Date: 12/21/2022 Fluoroscopy was utilized by the requesting physician.  No radiographic interpretation.   DG Chest Port 1 View  Result Date: 12/20/2022 CLINICAL DATA:  Fall.  Preoperative exam. EXAM: PORTABLE CHEST 1 VIEW COMPARISON:  07/26/2019. FINDINGS: The heart size and mediastinal contours are within normal limits. There is atherosclerotic calcification of the aorta. No consolidation, effusion, or pneumothorax. No acute osseous abnormality. IMPRESSION: No active disease. Electronically Signed   By: Brett Fairy M.D.   On: 12/20/2022 01:41   DG Hip Unilat  With Pelvis 2-3  Views Right  Result Date: 12/20/2022 CLINICAL DATA:  Status post fall. EXAM: DG HIP (WITH OR WITHOUT PELVIS) 2-3V RIGHT COMPARISON:  None Available. FINDINGS: An acute fracture deformity is seen extending through the neck of the proximal right femur. There  is no evidence of dislocation. Moderate severity degenerative changes are seen in the form of joint space narrowing and acetabular sclerosis. IMPRESSION: Acute fracture of the proximal right femur. Electronically Signed   By: Virgina Norfolk M.D.   On: 12/20/2022 00:10   DG Wrist Complete Right  Result Date: 12/20/2022 CLINICAL DATA:  Status post fall. EXAM: RIGHT WRIST - COMPLETE 3+ VIEW COMPARISON:  None Available. FINDINGS: There is no evidence of an acute fracture or dislocation. Marked severity degenerative changes are seen involving the first right carpometacarpal articulation. Soft tissues are unremarkable. IMPRESSION: Degenerative changes without evidence of an acute osseous abnormality. Electronically Signed   By: Virgina Norfolk M.D.   On: 12/20/2022 00:07   DG Elbow Complete Right  Result Date: 12/20/2022 CLINICAL DATA:  Status post fall. EXAM: RIGHT ELBOW - COMPLETE 3+ VIEW COMPARISON:  None Available. FINDINGS: There is no evidence of fracture, dislocation, or joint effusion. There is no evidence of arthropathy or other focal bone abnormality. Soft tissues are unremarkable. IMPRESSION: Negative. Electronically Signed   By: Virgina Norfolk M.D.   On: 12/20/2022 00:06    Microbiology No results found for this or any previous visit (from the past 240 hour(s)).  Lab Basic Metabolic Panel: No results for input(s): "NA", "K", "CL", "CO2", "GLUCOSE", "BUN", "CREATININE", "CALCIUM", "MG", "PHOS" in the last 168 hours. Liver Function Tests: No results for input(s): "AST", "ALT", "ALKPHOS", "BILITOT", "PROT", "ALBUMIN" in the last 168 hours. No results for input(s): "LIPASE", "AMYLASE" in the last 168 hours. No results for input(s):  "AMMONIA" in the last 168 hours. CBC: No results for input(s): "WBC", "NEUTROABS", "HGB", "HCT", "MCV", "PLT" in the last 168 hours. Cardiac Enzymes: No results for input(s): "CKTOTAL", "CKMB", "CKMBINDEX", "TROPONINI" in the last 168 hours. Sepsis Labs: No results for input(s): "PROCALCITON", "WBC", "LATICACIDVEN" in the last 168 hours.     Candee Furbish 01/17/2023, 6:06 PM
